# Patient Record
Sex: Female | Born: 1985 | Race: White | Hispanic: Yes | Marital: Single | Smoking: Never smoker
Health system: Southern US, Community
[De-identification: ages and names within clinical notes are randomized; demographics above are authoritative.]

## PROBLEM LIST (undated history)

## (undated) DIAGNOSIS — D649 Anemia, unspecified: Secondary | ICD-10-CM

## (undated) DIAGNOSIS — F32A Depression, unspecified: Secondary | ICD-10-CM

## (undated) DIAGNOSIS — N368 Other specified disorders of urethra: Secondary | ICD-10-CM

## (undated) DIAGNOSIS — R519 Headache, unspecified: Secondary | ICD-10-CM

## (undated) DIAGNOSIS — T7840XA Allergy, unspecified, initial encounter: Secondary | ICD-10-CM

## (undated) DIAGNOSIS — Z87442 Personal history of urinary calculi: Secondary | ICD-10-CM

## (undated) DIAGNOSIS — R350 Frequency of micturition: Secondary | ICD-10-CM

## (undated) DIAGNOSIS — F329 Major depressive disorder, single episode, unspecified: Secondary | ICD-10-CM

## (undated) DIAGNOSIS — R300 Dysuria: Secondary | ICD-10-CM

## (undated) DIAGNOSIS — E039 Hypothyroidism, unspecified: Secondary | ICD-10-CM

## (undated) DIAGNOSIS — R3915 Urgency of urination: Secondary | ICD-10-CM

## (undated) DIAGNOSIS — N3946 Mixed incontinence: Secondary | ICD-10-CM

## (undated) DIAGNOSIS — O3432 Maternal care for cervical incompetence, second trimester: Secondary | ICD-10-CM

## (undated) DIAGNOSIS — G8929 Other chronic pain: Secondary | ICD-10-CM

## (undated) DIAGNOSIS — N883 Incompetence of cervix uteri: Secondary | ICD-10-CM

## (undated) DIAGNOSIS — Z8639 Personal history of other endocrine, nutritional and metabolic disease: Secondary | ICD-10-CM

## (undated) DIAGNOSIS — Z8742 Personal history of other diseases of the female genital tract: Secondary | ICD-10-CM

## (undated) DIAGNOSIS — Z87410 Personal history of cervical dysplasia: Secondary | ICD-10-CM

## (undated) HISTORY — PX: NO PAST SURGERIES: SHX2092

## (undated) HISTORY — DX: Maternal care for cervical incompetence, second trimester: O34.32

## (undated) HISTORY — DX: Anemia, unspecified: D64.9

## (undated) HISTORY — DX: Allergy, unspecified, initial encounter: T78.40XA

---

## 2014-04-18 ENCOUNTER — Ambulatory Visit: Payer: Self-pay

## 2014-06-10 ENCOUNTER — Ambulatory Visit: Payer: Self-pay | Attending: Internal Medicine

## 2015-08-10 NOTE — L&D Delivery Note (Signed)
Patient called out to the RN in pain at about 11:18PM, and had delivered the baby in the bed before the nurse was in the room. Nurse clamped x2 and cut the cord, handed the mother her baby. Placenta was left in place until I came to the room. When I arrived, the patient was comfortable, holding the non-viable infant. It was noted about 300cc of clots/blood was seen outside of the vagina in the bed. Placental cord was laying out of the vagina. I had patient push three times, and the placenta delivered spontaneously intact. A large 50cc clot followed behind the placenta. Fundus was firm, about at the suprapubic bone. Good hemostasis was noted. Placenta was sent to pathology. Mom recovering in LDR, infant was pronounced dead at 23:18 on 13-Aug-2016 by the RN, being held by mom currently.   Zenda Alpers, DO  [facility] for [facility], [facility]

## 2016-03-15 DIAGNOSIS — R87610 Atypical squamous cells of undetermined significance on cytologic smear of cervix (ASC-US): Secondary | ICD-10-CM | POA: Diagnosis present

## 2016-03-29 DIAGNOSIS — E039 Hypothyroidism, unspecified: Secondary | ICD-10-CM | POA: Insufficient documentation

## 2016-06-03 LAB — CYTOLOGY - PAP: Pap: NEGATIVE

## 2016-06-03 LAB — OB RESULTS CONSOLE ABO/RH: RH Type: POSITIVE

## 2016-06-03 LAB — OB RESULTS CONSOLE GC/CHLAMYDIA
CHLAMYDIA, DNA PROBE: NEGATIVE
GC PROBE AMP, GENITAL: NEGATIVE

## 2016-06-03 LAB — OB RESULTS CONSOLE RUBELLA ANTIBODY, IGM: RUBELLA: IMMUNE

## 2016-06-03 LAB — OB RESULTS CONSOLE HIV ANTIBODY (ROUTINE TESTING): HIV: NONREACTIVE

## 2016-06-03 LAB — OB RESULTS CONSOLE RPR: RPR: NONREACTIVE

## 2016-06-03 LAB — OB RESULTS CONSOLE HEPATITIS B SURFACE ANTIGEN: Hepatitis B Surface Ag: NEGATIVE

## 2016-06-03 LAB — OB RESULTS CONSOLE ANTIBODY SCREEN: ANTIBODY SCREEN: NEGATIVE

## 2016-06-09 ENCOUNTER — Other Ambulatory Visit: Payer: Self-pay | Admitting: Obstetrics and Gynecology

## 2016-06-09 DIAGNOSIS — E049 Nontoxic goiter, unspecified: Secondary | ICD-10-CM

## 2016-06-15 ENCOUNTER — Ambulatory Visit (HOSPITAL_COMMUNITY)
Admission: RE | Admit: 2016-06-15 | Discharge: 2016-06-15 | Disposition: A | Discharge status: Home / Self Care | Payer: Self-pay | Source: Ambulatory Visit | Attending: Obstetrics and Gynecology | Admitting: Obstetrics and Gynecology

## 2016-06-15 DIAGNOSIS — E049 Nontoxic goiter, unspecified: Secondary | ICD-10-CM

## 2016-07-21 ENCOUNTER — Inpatient Hospital Stay (HOSPITAL_COMMUNITY)
Admission: AD | Admit: 2016-07-21 | Discharge: 2016-07-22 | DRG: 775 | Disposition: A | Discharge status: Home / Self Care | Payer: Medicaid Other | Source: Ambulatory Visit | Attending: Obstetrics & Gynecology | Admitting: Obstetrics & Gynecology

## 2016-07-21 ENCOUNTER — Inpatient Hospital Stay (HOSPITAL_COMMUNITY): Payer: Medicaid Other

## 2016-07-21 ENCOUNTER — Encounter (HOSPITAL_COMMUNITY): Payer: Self-pay

## 2016-07-21 DIAGNOSIS — Z3A21 21 weeks gestation of pregnancy: Secondary | ICD-10-CM | POA: Diagnosis not present

## 2016-07-21 DIAGNOSIS — O4700 False labor before 37 completed weeks of gestation, unspecified trimester: Secondary | ICD-10-CM

## 2016-07-21 DIAGNOSIS — O039 Complete or unspecified spontaneous abortion without complication: Secondary | ICD-10-CM

## 2016-07-21 DIAGNOSIS — O3432 Maternal care for cervical incompetence, second trimester: Secondary | ICD-10-CM

## 2016-07-21 DIAGNOSIS — O364XX0 Maternal care for intrauterine death, not applicable or unspecified: Secondary | ICD-10-CM

## 2016-07-21 DIAGNOSIS — O479 False labor, unspecified: Secondary | ICD-10-CM

## 2016-07-21 LAB — DIFFERENTIAL
BASOS ABS: 0 10*3/uL (ref 0.0–0.1)
Basophils Relative: 0 %
Eosinophils Absolute: 0.1 10*3/uL (ref 0.0–0.7)
Eosinophils Relative: 1 %
LYMPHS ABS: 3.4 10*3/uL (ref 0.7–4.0)
LYMPHS PCT: 30 %
MONO ABS: 0.5 10*3/uL (ref 0.1–1.0)
MONOS PCT: 4 %
NEUTROS ABS: 7.4 10*3/uL (ref 1.7–7.7)
Neutrophils Relative %: 65 %

## 2016-07-21 LAB — CBC
HEMATOCRIT: 35.5 % — AB (ref 36.0–46.0)
HEMOGLOBIN: 11.9 g/dL — AB (ref 12.0–15.0)
MCH: 27.8 pg (ref 26.0–34.0)
MCHC: 33.5 g/dL (ref 30.0–36.0)
MCV: 82.9 fL (ref 78.0–100.0)
Platelets: 245 10*3/uL (ref 150–400)
RBC: 4.28 MIL/uL (ref 3.87–5.11)
RDW: 14.2 % (ref 11.5–15.5)
WBC: 11.4 10*3/uL — AB (ref 4.0–10.5)

## 2016-07-21 LAB — TYPE AND SCREEN
ABO/RH(D): A POS
Antibody Screen: NEGATIVE

## 2016-07-21 MED ORDER — FENTANYL CITRATE (PF) 100 MCG/2ML IJ SOLN
100.0000 ug | Freq: Once | INTRAMUSCULAR | Status: AC
  Administered 2016-07-21: 100 ug via INTRAVENOUS
  Filled 2016-07-21: qty 2

## 2016-07-21 MED ORDER — ACETAMINOPHEN 325 MG PO TABS: 650.0000 mg | ORAL_TABLET | ORAL | Status: DC | PRN

## 2016-07-21 MED ORDER — LACTATED RINGERS IV SOLN
500.0000 mL | INTRAVENOUS | Status: DC | PRN
Start: 2016-07-21 — End: 2016-07-22

## 2016-07-21 MED ORDER — OXYCODONE-ACETAMINOPHEN 5-325 MG PO TABS: 1.0000 | ORAL_TABLET | ORAL | Status: DC | PRN

## 2016-07-21 MED ORDER — OXYTOCIN 40 UNITS IN LACTATED RINGERS INFUSION - SIMPLE MED
2.5000 [IU]/h | INTRAVENOUS | Status: DC
  Filled 2016-07-21: qty 1000

## 2016-07-21 MED ORDER — SOD CITRATE-CITRIC ACID 500-334 MG/5ML PO SOLN: 30.0000 mL | ORAL | Status: DC | PRN

## 2016-07-21 MED ORDER — LACTATED RINGERS IV SOLN
INTRAVENOUS | Status: DC
  Administered 2016-07-21: 125 mL/h via INTRAVENOUS

## 2016-07-21 MED ORDER — ONDANSETRON HCL 4 MG/2ML IJ SOLN: 4.0000 mg | Freq: Four times a day (QID) | INTRAMUSCULAR | Status: DC | PRN

## 2016-07-21 MED ORDER — FENTANYL CITRATE (PF) 100 MCG/2ML IJ SOLN: 100.0000 ug | Freq: Once | INTRAMUSCULAR | Status: DC

## 2016-07-21 MED ORDER — NALBUPHINE HCL 10 MG/ML IJ SOLN: 5.0000 mg | INTRAMUSCULAR | Status: DC | PRN

## 2016-07-21 MED ORDER — OXYCODONE-ACETAMINOPHEN 5-325 MG PO TABS: 2.0000 | ORAL_TABLET | ORAL | Status: DC | PRN

## 2016-07-21 MED ORDER — LACTATED RINGERS IV SOLN: INTRAVENOUS | Status: DC

## 2016-07-21 MED ORDER — FENTANYL CITRATE (PF) 100 MCG/2ML IJ SOLN
INTRAMUSCULAR | Status: AC
  Filled 2016-07-21: qty 2

## 2016-07-21 MED ORDER — OXYTOCIN BOLUS FROM INFUSION
500.0000 mL | Freq: Once | INTRAVENOUS | Status: AC
  Administered 2016-07-21: 500 mL via INTRAVENOUS

## 2016-07-21 MED ORDER — LIDOCAINE HCL (PF) 1 % IJ SOLN: 30.0000 mL | INTRAMUSCULAR | Status: DC | PRN

## 2016-07-21 NOTE — H&P (Signed)
LABOR AND DELIVERY ADMISSION HISTORY AND PHYSICAL NOTE  Andrea Barry is a 30 y.o. female G3P0020 with IUP at [redacted]w[redacted]d by definite LMP presenting for contractions.   Patient has been having contractions since yesterday, became strong today, so came to MAU. When she was seen in MAU, she was visually uncomfortable with contractions about every 2 min.   She was seen in the room immediately, a SSE was performed, unable to get speculum in the vagina due to membranes and fetal parts (looked like head/hair) were near introitus. SVE felt head/bulging bag at +3 station, with complete dilation of cervix. Imminent delivery was likely.   Patient has had two other SABs previously, states her first SAB in 2016 was at about 20weeks.   Past Medical History: History reviewed. No pertinent past medical history. ?Hyperthyroidism  Past Surgical History: History reviewed. No pertinent surgical history.   Obstetrical History: OB History    Gravida Para Term Preterm AB Living   3 0 0 0 2 0   SAB TAB Ectopic Multiple Live Births   2 0 0 0 0      Social History: Social History   Social History  . Marital status: Single    Spouse name: N/A  . Number of children: N/A  . Years of education: N/A   Social History Main Topics  . Smoking status: None  . Smokeless tobacco: None  . Alcohol use None  . Drug use: Unknown  . Sexual activity: Not Asked   Other Topics Concern  . None   Social History Narrative  . None    Family History: No family history on file.  Allergies: No Known Allergies  Prescriptions Prior to Admission  Medication Sig Dispense Refill Last Dose  . Prenatal Vit-Fe Fumarate-FA (PREPLUS) 27-1 MG TABS Take 1 tablet by mouth daily.        Review of Systems   All systems reviewed and negative except as stated in HPI  Blood pressure (!) 113/56, pulse 75, temperature 98.7 F (37.1 C), temperature source Oral, resp. rate 17, height 5\' 6"  (1.676 m), weight 180 lb (81.6 kg),  SpO2 100 %. General appearance: alert, cooperative, appears stated age and mild distress due to contractions Lungs: clear to auscultation bilaterally Heart: regular rate and rhythm Abdomen: soft, non-tender; bowel sounds normal Extremities: No calf swelling or tenderness Presentation: cephalic initially visually with speculum exam, after AROM (lightly yellow tinged fluid), a cord and foot was found in the vagina.   Prenatal labs: ABO, Rh: A/Positive/-- (10/26 0000) Antibody: Negative (10/26 0000) Rubella: !Error! IMMUNE RPR: Nonreactive (10/26 0000)  HBsAg: Negative (10/26 0000)  HIV: Non-reactive (10/26 0000)  GBS:   N/A 1 hr Glucola: (early) 92 Genetic screening: Incomplete labs, but CF neg, and may have had done at last visit but do not have results, may be at [facility]. Anatomy US: Do not have, bedside US today for dating shows 21 1/7 weeks.  Prenatal Transfer Tool  Maternal Diabetes: No Genetic Screening: Do not have records Maternal Ultrasounds/Referrals: Do not have records Fetal Ultrasounds or other Referrals:  None Maternal Substance Abuse:  No Significant Maternal Medications:  None Significant Maternal Lab Results: Lab values include: Other:   Results for orders placed or performed during the hospital encounter of 07/21/16 (from the past 24 hour(s))  CBC   Collection Time: 07/21/16  9:43 PM  Result Value Ref Range   WBC 11.4 (H) 4.0 - 10.5 K/uL   RBC 4.28 3.87 - 5.11 MIL/uL  Hemoglobin 11.9 (L) 12.0 - 15.0 g/dL   HCT 35.5 (L) 36.0 - 46.0 %   MCV 82.9 78.0 - 100.0 fL   MCH 27.8 26.0 - 34.0 pg   MCHC 33.5 30.0 - 36.0 g/dL   RDW 14.2 11.5 - 15.5 %   Platelets 245 150 - 400 K/uL  Differential   Collection Time: 07/21/16  9:43 PM  Result Value Ref Range   Neutrophils Relative % 65 %   Neutro Abs 7.4 1.7 - 7.7 K/uL   Lymphocytes Relative 30 %   Lymphs Abs 3.4 0.7 - 4.0 K/uL   Monocytes Relative 4 %   Monocytes Absolute 0.5 0.1 - 1.0 K/uL   Eosinophils Relative 1 %    Eosinophils Absolute 0.1 0.0 - 0.7 K/uL   Basophils Relative 0 %   Basophils Absolute 0.0 0.0 - 0.1 K/uL    There are no active problems to display for this patient.   Assessment: Andrea Barry is a 30 y.o. G3P0020 at [redacted]w[redacted]d here for SAB at [redacted] weeks EGA.  Anticipate delivery, AROM performed in MAU to augment inevitable delivery Fentanyl as needed Bedside US to be performed to confirm dating (Definite LMP) TORCH labs, prenatal labs, TSH/T4/T3 drawn, UDS collected   [name], [place] for [facility], [facility] 07/21/2016, 10:41 PM

## 2016-07-21 NOTE — MAU Note (Signed)
Pt reports contraction like pain that started yesterday. Denies LOF or vag bleeding. +FM. Denies urinary s/s, n/v/d.

## 2016-07-22 ENCOUNTER — Encounter (HOSPITAL_COMMUNITY): Payer: Self-pay | Admitting: *Deleted

## 2016-07-22 DIAGNOSIS — Z3A21 21 weeks gestation of pregnancy: Secondary | ICD-10-CM

## 2016-07-22 LAB — RAPID URINE DRUG SCREEN, HOSP PERFORMED
Amphetamines: NOT DETECTED
BARBITURATES: NOT DETECTED
BENZODIAZEPINES: NOT DETECTED
Cocaine: NOT DETECTED
Opiates: NOT DETECTED
TETRAHYDROCANNABINOL: NOT DETECTED

## 2016-07-22 LAB — URINALYSIS, ROUTINE W REFLEX MICROSCOPIC
BILIRUBIN URINE: NEGATIVE
GLUCOSE, UA: NEGATIVE mg/dL
KETONES UR: NEGATIVE mg/dL
LEUKOCYTES UA: NEGATIVE
Nitrite: NEGATIVE
PH: 6 (ref 5.0–8.0)
Protein, ur: NEGATIVE mg/dL
Specific Gravity, Urine: 1.017 (ref 1.005–1.030)
Squamous Epithelial / LPF: NONE SEEN

## 2016-07-22 LAB — TSH: TSH: 2.186 u[IU]/mL (ref 0.350–4.500)

## 2016-07-22 LAB — RPR: RPR: NONREACTIVE

## 2016-07-22 LAB — HIV ANTIBODY (ROUTINE TESTING W REFLEX): HIV SCREEN 4TH GENERATION: NONREACTIVE

## 2016-07-22 LAB — HEPATITIS B SURFACE ANTIGEN: Hepatitis B Surface Ag: NEGATIVE

## 2016-07-22 LAB — ABO/RH: ABO/RH(D): A POS

## 2016-07-22 LAB — T4, FREE: FREE T4: 0.71 ng/dL (ref 0.61–1.12)

## 2016-07-22 MED ORDER — IBUPROFEN 600 MG PO TABS: 600.0000 mg | ORAL_TABLET | Freq: Four times a day (QID) | ORAL | 0 refills | Status: DC | PRN

## 2016-07-22 MED ORDER — IBUPROFEN 600 MG PO TABS
600.0000 mg | ORAL_TABLET | Freq: Once | ORAL | Status: AC
  Administered 2016-07-22: 600 mg via ORAL
  Filled 2016-07-22: qty 1

## 2016-07-22 NOTE — Discharge Instructions (Signed)
Vaginal Delivery, Care After Refer to this sheet in the next few weeks. These instructions provide you with information on caring for yourself after your delivery. Your health care provider may also give you more specific instructions. Your treatment has been planned according to current medical practices, but problems sometimes occur. Call your health care provider if you have any problems or questions after your delivery. What to expect after your delivery After your delivery, it is typical to have the following:  You may feel pain in the vaginal area for several days after delivery. If you had an incision or a vaginal tear, the area will probably continue to be tender to the touch for several weeks.  You may feel very fatigued after a vaginal delivery.  You may have vaginal bleeding and discharge that will start out red, then become pink, then yellow, then white. Altogether, this usually lasts for about 6 weeks.  The combination of having lost your baby and changing hormones from the delivery can make you feel very sad. You may also experience emotions that change very quickly. Some of the emotions people often notice after loss include:  Anger.  Denial.  Guilt.  Sorrow.  Depression.  Grief.  Relationship problems. Follow these instructions at home:  Consider seeking support for your loss. Some forms of support that you might consider include your religious leader, friends, family, a Medical illustrator, or a bereavement support group.  Take medicines only as directed by your health care provider.  Continue to use good perineal care. Good perineal care includes:  Wiping your perineum from front to back.  Keeping your perineum clean.  Do not use tampons or douche until your health care provider says it is okay.  Shower, wash your hair, and take tub baths as directed by your health care provider.  Wear a well-fitting bra that provides breast support.  Drink enough  fluids to keep your urine clear or pale yellow.  Eat healthy foods.  Eat high-fiber foods every day, such as whole grain cereals and breads, brown rice, beans, and fresh fruits and vegetables. These foods may help prevent or relieve constipation.  Follow your health care provider's directions about resuming activities such as climbing stairs, driving, lifting, exercising, or traveling.  Increase your activities gradually.  Talk to your health care provider about resuming sexual activities. This depends on your risk of infection, your rate of healing, and your comfort and desire to resume sexual activity.  Try to have someone help you with your household activities for at least a few days after you leave the hospital.  Rest as much as possible.  Keep all of your scheduled postpartum appointments. It is very important to keep your scheduled follow-up appointments. At these appointments, your health care provider will be checking to make sure that you are healing physically and emotionally.  Do not drink alcohol, especially if you are taking medicine to relieve pain.  Do not use any tobacco products including cigarettes, chewing tobacco, or electronic cigarettes. If you need help quitting, ask your health care provider.  Do not use illegal drugs. Contact a health care provider if:  You feel sad or depressed.  You have thoughts of hurting yourself.  You are having trouble eating or sleeping.  You cannot enjoy the things in life you have previously enjoyed.  You are passing large clots from your vagina. Save any clots to show your health care provider.  You have a bad smelling discharge from your vagina.  You have trouble urinating.  You are urinating frequently.  You have pain when you urinate.  You have a change in your bowel movements.  You have increasing redness, pain, or swelling near your incision or vaginal tear.  You have pus draining from your incision or vaginal  tear.  Your incision or vaginal tear is separating.  You have painful, hard, or reddened breasts.  You have a severe headache.  You have blurred vision or see spots.  You are dizzy or light-headed.  You have a rash.  You have nausea or vomiting.  You have not had a menstrual period by the 12th week after delivery.  You have a fever. Get help right away if:  You are concerned that you may hurt yourself or you are considering suicide.  You have persistent pain.  You have chest pain.  You have shortness of breath.  You faint.  You have leg pain.  You have stomach pain.  Your vaginal bleeding saturates two or more sanitary pads in 1 hour. This information is not intended to replace advice given to you by your health care provider. Make sure you discuss any questions you have with your health care provider. Document Released: 12/10/2013 Document Revised: 01/01/2016 Document Reviewed: 09/13/2013 Elsevier Interactive Patient Education  2017 [employer].

## 2016-07-22 NOTE — Discharge Summary (Signed)
OB Discharge Summary     Patient Name: Andrea Barry DOB: 06/05/1986 MRN: [id number]  Date of admission: 07/21/2016 Delivering MD: Isaias Sakai   Date of discharge: 07/22/2016  Admitting diagnosis: 20w problems Intrauterine pregnancy: [redacted]w[redacted]d     Secondary diagnosis:  Principal Problem:   Premature labor before [redacted] weeks gestation Active Problems:   Fetal demise before 22 weeks with retention of dead fetus  Additional problems: None     Discharge diagnosis: Pre-viable delivery                                                                                                Post partum procedures:None  Augmentation: AROM  Complications: None  Hospital course:  Onset of Labor With Vaginal Delivery     30 y.o. yo [id number] at [redacted]w[redacted]d was admitted in Active Labor on 07/21/2016. Patient had an uncomplicated pre-viable preterm labor course as follows:  Patient came in contracting every 2-3 minutes, found to have membrane and fetal parts in vagina near introitus.  Membrane Rupture Time/Date: 9:59 PM ,07/21/2016   Intrapartum Procedures: Episiotomy: None [1]                                         Lacerations:  None [1]  Patient had a precipitous delivery of a Non Viable 21 week infant (LMP confirmed with femur length on [place] for dating before delivery). 07/21/2016   Information for the patient's newborn:  Andrea, Barry [id number]  Delivery Method: Vaginal, Spontaneous Delivery (Filed from Delivery Summary)    Pateint had an uncomplicated postpartum course. She requested to go home after the delivery and appropriate grieving time with the fetus.  She is ambulating, tolerating a regular diet, passing flatus, and urinating well. Her lochia is appropriate. Patient is discharged home in stable condition on 07/25/16.   Discussed with patient to follow up with OB/GYN (information for [place] office given). Recommended pelvic rest for at least 6 weeks.   Recommend that  patient should be considered high risk for any future pregnancies (due to TWO 20+ week pregnancy losses. Encouraged follow up with OB/GYN for pre-conception counseling. May need cervical length checks, possible prophylaxis cerclage and/or progesterone.     Physical exam Vitals:   07/22/16 0001 07/22/16 0016 07/22/16 0031 07/22/16 0046  BP: 104/60 (!) 100/59 (!) 97/57 93/62  Pulse: 73 72 86 87  Resp: 16   18  Temp: 98.8 F (37.1 C)   98.4 F (36.9 C)  TempSrc: Oral   Oral  SpO2:      Weight:      Height:       General: alert, cooperative and no distress Lochia: appropriate Uterine Fundus: firm Incision: N/A DVT Evaluation: No evidence of DVT seen on physical exam. Negative Homan's sign. No cords or calf tenderness. Labs: Lab Results  Component Value Date   WBC 11.4 (H) 07/21/2016   HGB 11.9 (L) 07/21/2016   HCT 35.5 (L) 07/21/2016   MCV 82.9 07/21/2016  PLT 245 07/21/2016   No flowsheet data found.  Discharge instruction: per After Visit Summary and "Baby and Me Booklet".  After visit meds:  Allergies as of 07/22/2016   No Known Allergies     Medication List    TAKE these medications   ibuprofen 600 MG tablet Commonly known as:  ADVIL,MOTRIN Take 1 tablet (600 mg total) by mouth every 6 (six) hours as needed for mild pain, moderate pain or cramping.   PREPLUS 27-1 MG Tabs Take 1 tablet by mouth daily.       Diet: routine diet  Activity: Advance as tolerated. Pelvic rest for 6 weeks.   Outpatient follow up:1-2 weeks for follow up after 21 week SAB and anticipatory guidance for future pregnancies.  Postpartum contraception: None  Newborn Data: Female  Birth Weight: 15.9 oz (451 g) APGAR: 0, 0 Pre-viable at [redacted] weeks EGA  Disposition:morgue   07/22/2016 Andrea Basset, DO

## 2016-07-23 LAB — RUBELLA SCREEN: Rubella: 2.18 index (ref >0.99)

## 2016-07-23 LAB — SICKLE CELL SCREEN: SICKLE CELL SCREEN: NEGATIVE

## 2016-07-23 NOTE — Progress Notes (Signed)
I was paged by [employer] to offer support to Andrea Barry and her husband who were here to fill out Report of Fetal Death.  I offered emotional and spiritual support as well as help with questions about cremation.  The family is still making a decision about a funeral home, but will let us know.  They viewed their son one more time and were very grateful to have the opportunity to hold him again.  [place], [place] Pager, [phone] 2:43 PM

## 2016-07-24 LAB — TORCH-IGM(TOXO/ RUB/ CMV/ HSV) W TITER
CMV IgM: <30 AU/mL (ref 0.0–29.9)
HSVI/II Comb IgM: 1.2 Ratio — ABNORMAL HIGH (ref 0.00–0.90)

## 2016-07-24 LAB — INFECT DISEASE AB IGM REFLEX 1

## 2016-07-28 ENCOUNTER — Encounter (HOSPITAL_COMMUNITY): Payer: Self-pay | Admitting: *Deleted

## 2016-07-28 ENCOUNTER — Inpatient Hospital Stay (HOSPITAL_COMMUNITY)
Admission: AD | Admit: 2016-07-28 | Discharge: 2016-07-28 | Disposition: A | Discharge status: Home / Self Care | Payer: Medicaid Other | Source: Ambulatory Visit | Attending: Family Medicine | Admitting: Family Medicine

## 2016-07-28 DIAGNOSIS — R52 Pain, unspecified: Secondary | ICD-10-CM | POA: Diagnosis not present

## 2016-07-28 DIAGNOSIS — O9089 Other complications of the puerperium, not elsewhere classified: Secondary | ICD-10-CM

## 2016-07-28 DIAGNOSIS — R1030 Lower abdominal pain, unspecified: Secondary | ICD-10-CM | POA: Insufficient documentation

## 2016-07-28 HISTORY — DX: Hypothyroidism, unspecified: E03.9

## 2016-07-28 LAB — URINALYSIS, ROUTINE W REFLEX MICROSCOPIC
Bilirubin Urine: NEGATIVE
GLUCOSE, UA: NEGATIVE mg/dL
Hgb urine dipstick: NEGATIVE
KETONES UR: NEGATIVE mg/dL
LEUKOCYTES UA: NEGATIVE
Nitrite: NEGATIVE
PH: 6 (ref 5.0–8.0)
Protein, ur: NEGATIVE mg/dL
SPECIFIC GRAVITY, URINE: 1.018 (ref 1.005–1.030)

## 2016-07-28 LAB — CBC WITH DIFFERENTIAL/PLATELET
Basophils Absolute: 0 10*3/uL (ref 0.0–0.1)
Basophils Relative: 0 %
Eosinophils Absolute: 0.1 10*3/uL (ref 0.0–0.7)
Eosinophils Relative: 1 %
HEMATOCRIT: 26.1 % — AB (ref 36.0–46.0)
HEMOGLOBIN: 8.4 g/dL — AB (ref 12.0–15.0)
LYMPHS ABS: 2.5 10*3/uL (ref 0.7–4.0)
LYMPHS PCT: 35 %
MCH: 27 pg (ref 26.0–34.0)
MCHC: 32.2 g/dL (ref 30.0–36.0)
MCV: 83.9 fL (ref 78.0–100.0)
MONO ABS: 0.3 10*3/uL (ref 0.1–1.0)
MONOS PCT: 4 %
NEUTROS ABS: 4.1 10*3/uL (ref 1.7–7.7)
NEUTROS PCT: 60 %
Platelets: 333 10*3/uL (ref 150–400)
RBC: 3.11 MIL/uL — ABNORMAL LOW (ref 3.87–5.11)
RDW: 14.2 % (ref 11.5–15.5)
WBC: 7 10*3/uL (ref 4.0–10.5)

## 2016-07-28 LAB — WET PREP, GENITAL
Clue Cells Wet Prep HPF POC: NONE SEEN
SPERM: NONE SEEN
Trich, Wet Prep: NONE SEEN
Yeast Wet Prep HPF POC: NONE SEEN

## 2016-07-28 LAB — POCT PREGNANCY, URINE: Preg Test, Ur: POSITIVE — AB

## 2016-07-28 MED ORDER — OXYCODONE-ACETAMINOPHEN 5-325 MG PO TABS: 1.0000 | ORAL_TABLET | ORAL | 0 refills | Status: DC | PRN

## 2016-07-28 MED ORDER — KETOROLAC TROMETHAMINE 60 MG/2ML IM SOLN: 60.0000 mg | Freq: Once | INTRAMUSCULAR | Status: DC

## 2016-07-28 NOTE — MAU Provider Note (Signed)
Chief Complaint: Headache and Abdominal Cramping   None     SUBJECTIVE HPI: Andrea Barry is a 30 y.o. [phone] who is 1 week s/p previable delivery at 29 weeks who presents to maternity admissions reporting lower abdominal and vaginal pain x 2-3 days.  She reports the pain is only when she lies down.  It is intermittent cramping pain that is not resolved with ibuprofen.  She has not tried any other treatments.  The pain is intermittent and unchanged since onset.  She reports scant pink lochia only.  She denies vaginal itching/burning, urinary symptoms, h/a, dizziness, n/v, or fever/chills.     HPI  Past Medical History:  Diagnosis Date  . Hypothyroidism   . Preterm delivery    2nd trimester fetal loss   History reviewed. No pertinent surgical history. Social History   Social History  . Marital status: Single    Spouse name: N/A  . Number of children: N/A  . Years of education: N/A   Occupational History  . Not on file.   Social History Main Topics  . Smoking status: Never Smoker  . Smokeless tobacco: Never Used  . Alcohol use No  . Drug use: No  . Sexual activity: Not on file   Other Topics Concern  . Not on file   Social History Narrative  . No narrative on file   No current facility-administered medications on file prior to encounter.    Current Outpatient Prescriptions on File Prior to Encounter  Medication Sig Dispense Refill  . ibuprofen (ADVIL,MOTRIN) 600 MG tablet Take 1 tablet (600 mg total) by mouth every 6 (six) hours as needed for mild pain, moderate pain or cramping. (Patient not taking: Reported on 07/28/2016) 30 tablet 0   Allergies  Allergen Reactions  . Metronidazole Other (See Comments)    Dizziness, taste of iron in mouth, and darkened urine    ROS:  Review of Systems  Constitutional: Negative for chills, fatigue and fever.  Respiratory: Negative for shortness of breath.   Cardiovascular: Negative for chest pain.  Gastrointestinal:  Positive for abdominal pain.  Genitourinary: Positive for pelvic pain and vaginal pain. Negative for difficulty urinating, dysuria, flank pain, vaginal bleeding and vaginal discharge.  Neurological: Negative for dizziness and headaches.  Psychiatric/Behavioral: Negative.      I have reviewed patient's Past Medical Hx, Surgical Hx, Family Hx, Social Hx, medications and allergies.   Physical Exam   Patient Vitals for the past 24 hrs:  BP Temp Temp src Pulse Resp  07/28/16 1431 112/64 99 F (37.2 C) Oral 86 16   Constitutional: Well-developed, well-nourished female in no acute distress.  Cardiovascular: normal rate Respiratory: normal effort GI: Abd soft, non-tender. Pos BS x 4 MS: Extremities nontender, no edema, normal ROM Neurologic: Alert and oriented x 4.  GU: Neg CVAT.  PELVIC EXAM: Cervix pink, visually closed, without lesion, scant white creamy discharge, vaginal walls and external genitalia normal Bimanual exam: Cervix 0/long/high, firm, anterior, neg CMT, uterus nontender, nonenlarged, adnexa without tenderness, enlargement, or mass   LAB RESULTS Results for orders placed or performed during the hospital encounter of 07/28/16 (from the past 24 hour(s))  Urinalysis, Routine w reflex microscopic     Status: None   Collection Time: 07/28/16  3:22 PM  Result Value Ref Range   Color, Urine YELLOW YELLOW   APPearance CLEAR CLEAR   Specific Gravity, Urine 1.018 1.005 - 1.030   pH 6.0 5.0 - 8.0   Glucose, UA NEGATIVE NEGATIVE mg/dL  Hgb urine dipstick NEGATIVE NEGATIVE   Bilirubin Urine NEGATIVE NEGATIVE   Ketones, ur NEGATIVE NEGATIVE mg/dL   Protein, ur NEGATIVE NEGATIVE mg/dL   Nitrite NEGATIVE NEGATIVE   Leukocytes, UA NEGATIVE NEGATIVE  Pregnancy, urine POC     Status: Abnormal   Collection Time: 07/28/16  3:23 PM  Result Value Ref Range   Preg Test, Ur POSITIVE (A) NEGATIVE  CBC with Differential     Status: Abnormal   Collection Time: 07/28/16  3:49 PM  Result  Value Ref Range   WBC 7.0 4.0 - 10.5 K/uL   RBC 3.11 (L) 3.87 - 5.11 MIL/uL   Hemoglobin 8.4 (L) 12.0 - 15.0 g/dL   HCT 26.1 (L) 36.0 - 46.0 %   MCV 83.9 78.0 - 100.0 fL   MCH 27.0 26.0 - 34.0 pg   MCHC 32.2 30.0 - 36.0 g/dL   RDW 14.2 11.5 - 15.5 %   Platelets 333 150 - 400 K/uL   Neutrophils Relative % 60 %   Neutro Abs 4.1 1.7 - 7.7 K/uL   Lymphocytes Relative 35 %   Lymphs Abs 2.5 0.7 - 4.0 K/uL   Monocytes Relative 4 %   Monocytes Absolute 0.3 0.1 - 1.0 K/uL   Eosinophils Relative 1 %   Eosinophils Absolute 0.1 0.0 - 0.7 K/uL   Basophils Relative 0 %   Basophils Absolute 0.0 0.0 - 0.1 K/uL  Wet prep, genital     Status: Abnormal   Collection Time: 07/28/16  4:31 PM  Result Value Ref Range   Yeast Wet Prep HPF POC NONE SEEN NONE SEEN   Trich, Wet Prep NONE SEEN NONE SEEN   Clue Cells Wet Prep HPF POC NONE SEEN NONE SEEN   WBC, Wet Prep HPF POC MODERATE (A) NONE SEEN   Sperm NONE SEEN     --/--/A POS, A POS (12/13 2143)  IMAGING [place] Mfm Ob Limited  Result Date: 07/22/2016 OBSTETRICAL ULTRASOUND: This exam was performed within a [facility]. The OB US report was generated in the AS system, and faxed to the ordering physician.  This report is available in the [employer]. See the AS Obstetric US report via the Image Link.   MAU Management/MDM: Ordered labs and reviewed results.  No evidence of infection or acute abdomen on exam.  Will treat for musculoskeletal pain with rest/ice/heat.  Rx for Percocet 5/325 x 6 tabs. Appointment made for pt to be seen in office tomorrow to see social work, get set up for counseling, and discuss plans for future pregnancies, cerclage etc.  Treatments in MAU included Toradol 60 mg IM with improvement in pt pain. Pt stable at time of discharge.  ASSESSMENT 1. Postpartum pain     PLAN Discharge home F/U in office tomorrow  Allergies as of 07/28/2016      Reactions   Metronidazole Other (See Comments)   Dizziness,  taste of iron in mouth, and darkened urine      Medication List    TAKE these medications   ibuprofen 600 MG tablet Commonly known as:  ADVIL,MOTRIN Take 1 tablet (600 mg total) by mouth every 6 (six) hours as needed for mild pain, moderate pain or cramping.   oxyCODONE-acetaminophen 5-325 MG tablet Commonly known as:  PERCOCET/ROXICET Take 1-2 tablets by mouth every 4 (four) hours as needed for severe pain.   SYNTHROID 100 MCG tablet Generic drug:  levothyroxine Take 200 mcg by mouth daily before breakfast.  Follow-up [place] for [place] Follow up.   Specialty:  Obstetrics and Gynecology Why:  As scheduled tomorrow at 10:20 am.  Return to MAU as needed for emergencies. Contact information: [place] Certified Nurse-Midwife 07/28/2016  5:29 PM

## 2016-07-28 NOTE — Discharge Instructions (Signed)
Musculoskeletal Pain  Musculoskeletal pain is muscle and bone aches and pains. This pain can occur in any part of the body.  Follow these instructions at home:  · Only take medicines for pain, discomfort, or fever as told by your health care provider.  · You may continue all activities unless the activities cause more pain. When the pain lessens, slowly resume normal activities. Gradually increase the intensity and duration of the activities or exercise.  · During periods of severe pain, bed rest may be helpful. Lie or sit in any position that is comfortable, but get out of bed and walk around at least every several hours.  · If directed, put ice on the injured area.  ? Put ice in a plastic bag.  ? Place a towel between your skin and the bag.  ? Leave the ice on for 20 minutes, 2-3 times a day.  Contact a health care provider if:  · Your pain is getting worse.  · Your pain is not relieved with medicines.  · You lose function in the area of the pain if the pain is in your arms, legs, or neck.  This information is not intended to replace advice given to you by your health care provider. Make sure you discuss any questions you have with your health care provider.  Document Released: 07/26/2005 Document Revised: 01/06/2016 Document Reviewed: 03/30/2013  Elsevier Interactive Patient Education © 2017 [employer] Inc[employer]

## 2016-07-28 NOTE — MAU Note (Signed)
Had ~21wk loss last Wed.  Barely bleeding.  Still having pain and a horrible headache.  Resting during the day, both worse at night.

## 2016-07-29 ENCOUNTER — Ambulatory Visit (INDEPENDENT_AMBULATORY_CARE_PROVIDER_SITE_OTHER): Payer: Medicaid Other | Admitting: Certified Nurse Midwife

## 2016-07-29 ENCOUNTER — Encounter: Payer: Self-pay | Admitting: Certified Nurse Midwife

## 2016-07-29 VITALS — BP 101/65 | HR 81

## 2016-07-29 DIAGNOSIS — O364XX0 Maternal care for intrauterine death, not applicable or unspecified: Secondary | ICD-10-CM

## 2016-07-29 LAB — GC/CHLAMYDIA PROBE AMP (~~LOC~~) NOT AT ARMC
CHLAMYDIA, DNA PROBE: NEGATIVE
NEISSERIA GONORRHEA: NEGATIVE

## 2016-07-29 NOTE — Progress Notes (Signed)
GYN Note  Name: Andrea Barry Date: 07/29/2016 MRN: [id number]  DOB: 1985-08-20  HISTORY OF PRESENT ILLNESS: S/p SVD at 21.[redacted] weeks gestation 1 week ago. Pt reports insomnia and poor appetite since discharge. She is having a small amount of spotting and low back pain. She was seen in [facility] yesterday for cramping and HA and evaluation was negative. She endorses waxing and waning feelings of sadness and anger. She has been supported by her boyfriend, brother, and father. She is asking about resuming work Architectural technologist. Review of her chart reveals previous delivery at 20 weeks.  PAST MEDICAL HISTORY: Past Medical History:  Diagnosis Date  . Hypothyroidism   . Preterm delivery    2nd trimester fetal loss    PAST SURGICAL HISTORY: No past surgical history on file.  FAMILY HISTORY: No family history on file.  SOCIAL HISTORY:  reports that she has never smoked. She has never used smokeless tobacco. She reports that she does not drink alcohol or use drugs.  ALLERGIES:  is allergic to metronidazole.  MEDICATIONS:  Current Outpatient Prescriptions  Medication Sig Dispense Refill  . levothyroxine (SYNTHROID) 100 MCG tablet Take 200 mcg by mouth daily before breakfast.     No current facility-administered medications for this visit.     REVIEW OF SYSTEMS:  Pertinent items are noted in HPI.    BP 101/65   Pulse 81   Physical Exam: General appearance: alert, cooperative, no distress and teary at times Resp: normal effort and rate Cardio: nml rate Neurologic: Grossly normal  LABORATORY DATA: Results for orders placed or performed during the hospital encounter of 07/28/16 (from the past 72 hour(s))  Urinalysis, Routine w reflex microscopic     Status: None   Collection Time: 07/28/16  3:22 PM  Result Value Ref Range   Color, Urine YELLOW YELLOW   APPearance CLEAR CLEAR   Specific Gravity, Urine 1.018 1.005 - 1.030   pH 6.0 5.0 - 8.0   Glucose, UA NEGATIVE NEGATIVE mg/dL   Hgb urine  dipstick NEGATIVE NEGATIVE   Bilirubin Urine NEGATIVE NEGATIVE   Ketones, ur NEGATIVE NEGATIVE mg/dL   Protein, ur NEGATIVE NEGATIVE mg/dL   Nitrite NEGATIVE NEGATIVE   Leukocytes, UA NEGATIVE NEGATIVE  Pregnancy, urine POC     Status: Abnormal   Collection Time: 07/28/16  3:23 PM  Result Value Ref Range   Preg Test, Ur POSITIVE (A) NEGATIVE    Comment:        THE SENSITIVITY OF THIS METHODOLOGY IS >24 mIU/mL   CBC with Differential     Status: Abnormal   Collection Time: 07/28/16  3:49 PM  Result Value Ref Range   WBC 7.0 4.0 - 10.5 K/uL   RBC 3.11 (L) 3.87 - 5.11 MIL/uL   Hemoglobin 8.4 (L) 12.0 - 15.0 g/dL   HCT 26.1 (L) 36.0 - 46.0 %   MCV 83.9 78.0 - 100.0 fL   MCH 27.0 26.0 - 34.0 pg   MCHC 32.2 30.0 - 36.0 g/dL   RDW 14.2 11.5 - 15.5 %   Platelets 333 150 - 400 K/uL   Neutrophils Relative % 60 %   Neutro Abs 4.1 1.7 - 7.7 K/uL   Lymphocytes Relative 35 %   Lymphs Abs 2.5 0.7 - 4.0 K/uL   Monocytes Relative 4 %   Monocytes Absolute 0.3 0.1 - 1.0 K/uL   Eosinophils Relative 1 %   Eosinophils Absolute 0.1 0.0 - 0.7 K/uL   Basophils Relative 0 %   Basophils Absolute  0.0 0.0 - 0.1 K/uL  Wet prep, genital     Status: Abnormal   Collection Time: 07/28/16  4:31 PM  Result Value Ref Range   Yeast Wet Prep HPF POC NONE SEEN NONE SEEN   Trich, Wet Prep NONE SEEN NONE SEEN   Clue Cells Wet Prep HPF POC NONE SEEN NONE SEEN   WBC, Wet Prep HPF POC MODERATE (A) NONE SEEN    Comment: MANY BACTERIA SEEN   Sperm NONE SEEN    ASSESSMENT:  S/p SVD @21  weeks  PLAN:  Continue PNV daily OOW x6 weeks Behavioral health assessment if desires (declines today)- may call to schedule Heartstrings info Warm shower/heating pad prn back pain Tylenol or Ibuprofen prn HA Abstain from IC x6 weeks Warning sx discussed: fever, chills, bleeding Return for preconception counseling visit if desires Recommend early start of care with next pregnancy- consider cerclage  Total encounter 15  min, 100% of visit spent counseling

## 2016-07-29 NOTE — Progress Notes (Signed)
Patient reports difficulty with migraines recently

## 2016-09-28 ENCOUNTER — Encounter (HOSPITAL_COMMUNITY): Payer: Self-pay | Admitting: Obstetrics & Gynecology

## 2017-04-13 ENCOUNTER — Encounter: Payer: Self-pay | Admitting: Family Medicine

## 2017-04-13 ENCOUNTER — Ambulatory Visit (INDEPENDENT_AMBULATORY_CARE_PROVIDER_SITE_OTHER): Payer: Managed Care, Other (non HMO) | Admitting: Family Medicine

## 2017-04-13 VITALS — BP 103/63 | HR 69 | Temp 98.4°F | Resp 16 | Ht 63.78 in | Wt 135.0 lb

## 2017-04-13 DIAGNOSIS — E063 Autoimmune thyroiditis: Secondary | ICD-10-CM

## 2017-04-13 DIAGNOSIS — E038 Other specified hypothyroidism: Secondary | ICD-10-CM

## 2017-04-13 DIAGNOSIS — Z23 Encounter for immunization: Secondary | ICD-10-CM

## 2017-04-13 DIAGNOSIS — Z01419 Encounter for gynecological examination (general) (routine) without abnormal findings: Secondary | ICD-10-CM

## 2017-04-13 LAB — POCT WET + KOH PREP
Trich by wet prep: ABSENT
Yeast by KOH: ABSENT
Yeast by wet prep: ABSENT

## 2017-04-13 NOTE — Patient Instructions (Signed)
     IF you received an x-ray today, you will receive an invoice from [facility]. Please contact [facility] at [phone] with questions or concerns regarding your invoice.   IF you received labwork today, you will receive an invoice from [facility]. Please contact [facility] at [phone] with questions or concerns regarding your invoice.   Our billing staff will not be able to assist you with questions regarding bills from these companies.  You will be contacted with the lab results as soon as they are available. The fastest way to get your results is to activate your My Chart account. Instructions are located on the last page of this paperwork. If you have not heard from us regarding the results in 2 weeks, please contact this office.     

## 2017-04-13 NOTE — Progress Notes (Signed)
9/5/20183:54 PM  Zyah Gomm 12/16/1985, 31 y.o. female [id number]  Chief Complaint  Patient presents with  . Annual Exam    with pap     HPI:   Patient is a 31 y.o. female with past medical history significant for chronic thyroiditis who presents today for annual exam.  Menses regular, short cycles, heavy first day but then light, 3-5 days, no clots but painful. LMP last week.   [id number], patient has had 3 2T nonviable preterm deliveries, last one 07/2016. Not on birth control. Desires pregnancy, aware of recommendation for pre-conception counseling, planning with MFM. Coping ok, has good family support, has not done counseling, focuses on her work, denies any depression, does not want to talk about it.  Reports that during that pregnancy she had an abnormal pap, colpo not done  Reports has been stable on levothyroxine 200 mcg for years, gets medication mailed from [place]. reports biopsy done in [place] + chronic thyroiditis. US done 06/2016 - no nodules/ She not taken medication for past 2-3 months. New job that requires her to wake up earlier, having difficulty with new routine. Taking at night causes insomnia. Reports thinning hair, alternating cold/heat intolerance, mood changes, decreased libido.  Depression screen PHQ 2/9 04/13/2017  Decreased Interest 0  Down, Depressed, Hopeless 0  PHQ - 2 Score 0    Allergies  Allergen Reactions  . Metronidazole Other (See Comments)    Dizziness, taste of iron in mouth, and darkened urine    Current Outpatient Prescriptions on File Prior to Visit  Medication Sig Dispense Refill  . levothyroxine (SYNTHROID) 100 MCG tablet Take 200 mcg by mouth daily before breakfast.     No current facility-administered medications on file prior to visit.     Past Medical History:  Diagnosis Date  . Allergy   . Anemia   . Hypothyroidism   . Preterm delivery    2nd trimester fetal loss    History reviewed. No pertinent surgical  history.  Social History  Substance Use Topics  . Smoking status: Never Smoker  . Smokeless tobacco: Never Used  . Alcohol use No    Family History  Problem Relation Age of Onset  . Diabetes Mother   . Hypertension Mother   . Mental illness Maternal Grandmother   . Hypertension Paternal Grandmother     Review of Systems  Constitutional: Negative for chills and fever.  HENT: Negative for congestion, ear pain and sore throat.   Respiratory: Negative for cough and shortness of breath.   Cardiovascular: Negative for chest pain, palpitations and leg swelling.  Gastrointestinal: Negative for abdominal pain, nausea and vomiting.  Genitourinary: Negative for dysuria and hematuria.       Patient reports dyspareunia in low abdominal, she reports intermittent vaginal irritation and discomfort, denies any vaginal discharge     OBJECTIVE:  Blood pressure 103/63, pulse 69, temperature 98.4 F (36.9 C), temperature source Oral, resp. rate 16, height 5' 3.78" (1.62 m), weight 135 lb (61.2 kg), last menstrual period 03/26/2017, SpO2 100 %.  Physical Exam  Constitutional: She is oriented to person, place, and time and well-developed, well-nourished, and in no distress.  HENT:  Head: Normocephalic and atraumatic.  Right Ear: Hearing, tympanic membrane, external ear and ear canal normal.  Left Ear: Hearing, tympanic membrane, external ear and ear canal normal.  Mouth/Throat: Oropharynx is clear and moist.  Eyes: Pupils are equal, round, and reactive to light. EOM are normal.  Neck: Neck supple. Thyromegaly (mild, nontender, no  nodules palpated) present. No thyroid mass present.  Cardiovascular: Normal rate, regular rhythm and normal heart sounds.  Exam reveals no gallop and no friction rub.   No murmur heard. Pulmonary/Chest: Effort normal and breath sounds normal. She has no wheezes. She has no rales.  Abdominal: Soft. Bowel sounds are normal. She exhibits no distension and no mass. There  is no tenderness.  Genitourinary: Vagina normal, uterus normal, cervix normal, right adnexa normal and left adnexa normal.  Musculoskeletal: Normal range of motion.  Lymphadenopathy:    She has no cervical adenopathy.  Neurological: She is alert and oriented to person, place, and time. Gait normal.  Skin: Skin is warm and dry.    Results for orders placed or performed in visit on 04/13/17  POCT Wet + KOH Prep  Result Value Ref Range   Yeast by KOH Absent Absent   Yeast by wet prep Absent Absent   WBC by wet prep None (A) Few   Clue Cells Wet Prep HPF POC None None   Trich by wet prep Absent Absent   Bacteria Wet Prep HPF POC Many (A) Few   Epithelial Cells By [employer] Pref ([employer]) Moderate (A) None, Few, Too numerous to count   RBC,UR,HPF,POC None None RBC/hpf     ASSESSMENT and PLAN:  1. Encounter for annual routine gynecological examination Pap and routine labs done today. Patient resistant to talking about grief and possible depression /anxiety. Discussed door always open when she is ready.  - CBC with Differential - Comprehensive metabolic panel - Lipid panel - Pap IG, CT/NG w/ reflex HPV when ASC-U - POCT Wet + KOH Prep  3. Hypothyroidism due to Hashimoto's thyroiditis Checking TSH today. Titrate med as appropriate. Brainstormed several possible reminders.  - TSH  1. Need for influenza vaccination Flu vaccine given today. - Flu Vaccine QUAD 36+ mos IM       Rutherford Guys, MD Primary Care at [facility] [place], [place] [address] Ph.  [phone] Fax [phone]

## 2017-04-14 ENCOUNTER — Telehealth: Payer: Self-pay | Admitting: Family Medicine

## 2017-04-14 DIAGNOSIS — D649 Anemia, unspecified: Secondary | ICD-10-CM

## 2017-04-14 DIAGNOSIS — E065 Other chronic thyroiditis: Secondary | ICD-10-CM

## 2017-04-14 LAB — CBC WITH DIFFERENTIAL/PLATELET
Basophils Absolute: 0 10*3/uL (ref 0.0–0.2)
Basos: 0 %
EOS (ABSOLUTE): 0 10*3/uL (ref 0.0–0.4)
Eos: 1 %
Hematocrit: 31.8 % — ABNORMAL LOW (ref 34.0–46.6)
Hemoglobin: 9.6 g/dL — ABNORMAL LOW (ref 11.1–15.9)
Immature Grans (Abs): 0 10*3/uL (ref 0.0–0.1)
Immature Granulocytes: 0 %
Lymphocytes Absolute: 1.6 10*3/uL (ref 0.7–3.1)
Lymphs: 36 %
MCH: 21.7 pg — ABNORMAL LOW (ref 26.6–33.0)
MCHC: 30.2 g/dL — ABNORMAL LOW (ref 31.5–35.7)
MCV: 72 fL — ABNORMAL LOW (ref 79–97)
Monocytes Absolute: 0.3 10*3/uL (ref 0.1–0.9)
Monocytes: 7 %
Neutrophils Absolute: 2.6 10*3/uL (ref 1.4–7.0)
Neutrophils: 56 %
Platelets: 260 10*3/uL (ref 150–379)
RBC: 4.43 x10E6/uL (ref 3.77–5.28)
RDW: 18.2 % — ABNORMAL HIGH (ref 12.3–15.4)
WBC: 4.6 10*3/uL (ref 3.4–10.8)

## 2017-04-14 LAB — COMPREHENSIVE METABOLIC PANEL
ALT: 11 IU/L (ref 0–32)
AST: 22 IU/L (ref 0–40)
Albumin/Globulin Ratio: 1.3 (ref 1.2–2.2)
Albumin: 4.3 g/dL (ref 3.5–5.5)
Alkaline Phosphatase: 39 IU/L (ref 39–117)
BUN/Creatinine Ratio: 11 (ref 9–23)
BUN: 7 mg/dL (ref 6–20)
Bilirubin Total: 0.5 mg/dL (ref 0.0–1.2)
CO2: 24 mmol/L (ref 20–29)
Calcium: 9.3 mg/dL (ref 8.7–10.2)
Chloride: 104 mmol/L (ref 96–106)
Creatinine, Ser: 0.63 mg/dL (ref 0.57–1.00)
GFR calc Af Amer: 139 mL/min/{1.73_m2} (ref >59)
GFR calc non Af Amer: 121 mL/min/{1.73_m2} (ref >59)
Globulin, Total: 3.4 g/dL (ref 1.5–4.5)
Glucose: 84 mg/dL (ref 65–99)
Potassium: 4.7 mmol/L (ref 3.5–5.2)
Sodium: 138 mmol/L (ref 134–144)
Total Protein: 7.7 g/dL (ref 6.0–8.5)

## 2017-04-14 LAB — PAP IG, CT-NG, RFX HPV ASCU
Chlamydia, Nuc. Acid Amp: NEGATIVE
Gonococcus by Nucleic Acid Amp: NEGATIVE
PAP Smear Comment: 0

## 2017-04-14 LAB — LIPID PANEL
Chol/HDL Ratio: 2.3 ratio (ref 0.0–4.4)
Cholesterol, Total: 146 mg/dL (ref 100–199)
HDL: 64 mg/dL (ref >39)
LDL Calculated: 72 mg/dL (ref 0–99)
Triglycerides: 48 mg/dL (ref 0–149)
VLDL Cholesterol Cal: 10 mg/dL (ref 5–40)

## 2017-04-14 LAB — HEMOGLOBIN A1C
Est. average glucose Bld gHb Est-mCnc: 100 mg/dL
Hgb A1c MFr Bld: 5.1 % (ref 4.8–5.6)

## 2017-04-14 LAB — TSH: TSH: 3.38 u[IU]/mL (ref 0.450–4.500)

## 2017-04-14 MED ORDER — FERROUS SULFATE 325 (65 FE) MG PO TABS: 325.0000 mg | ORAL_TABLET | Freq: Every day | ORAL | 3 refills | Status: DC

## 2017-04-14 NOTE — Telephone Encounter (Signed)
Discussed lab results with  Patient. advised not restarting levothyroxine at this point, will cont to monitor. Fu 4 weeks.

## 2017-04-19 ENCOUNTER — Encounter: Payer: Self-pay | Admitting: Family Medicine

## 2017-06-11 ENCOUNTER — Encounter: Payer: Self-pay | Admitting: Family Medicine

## 2017-06-20 ENCOUNTER — Encounter: Payer: Self-pay | Admitting: Family Medicine

## 2017-06-20 ENCOUNTER — Ambulatory Visit (INDEPENDENT_AMBULATORY_CARE_PROVIDER_SITE_OTHER): Payer: Managed Care, Other (non HMO) | Admitting: Family Medicine

## 2017-06-20 ENCOUNTER — Other Ambulatory Visit: Payer: Self-pay

## 2017-06-20 VITALS — BP 104/60 | HR 76 | Temp 98.8°F | Resp 18 | Ht 64.07 in | Wt 132.4 lb

## 2017-06-20 DIAGNOSIS — R109 Unspecified abdominal pain: Secondary | ICD-10-CM | POA: Diagnosis not present

## 2017-06-20 DIAGNOSIS — D649 Anemia, unspecified: Secondary | ICD-10-CM | POA: Diagnosis not present

## 2017-06-20 DIAGNOSIS — N898 Other specified noninflammatory disorders of vagina: Secondary | ICD-10-CM

## 2017-06-20 DIAGNOSIS — R300 Dysuria: Secondary | ICD-10-CM

## 2017-06-20 LAB — CBC WITH DIFFERENTIAL/PLATELET
Basophils Absolute: 0 10*3/uL (ref 0.0–0.2)
Basos: 0 %
EOS (ABSOLUTE): 0.1 10*3/uL (ref 0.0–0.4)
Eos: 1 %
Hematocrit: 35 % (ref 34.0–46.6)
Hemoglobin: 10.3 g/dL — ABNORMAL LOW (ref 11.1–15.9)
Immature Grans (Abs): 0 10*3/uL (ref 0.0–0.1)
Immature Granulocytes: 0 %
Lymphocytes Absolute: 2.7 10*3/uL (ref 0.7–3.1)
Lymphs: 40 %
MCH: 21 pg — ABNORMAL LOW (ref 26.6–33.0)
MCHC: 29.4 g/dL — ABNORMAL LOW (ref 31.5–35.7)
MCV: 71 fL — ABNORMAL LOW (ref 79–97)
Monocytes Absolute: 0.6 10*3/uL (ref 0.1–0.9)
Monocytes: 9 %
Neutrophils Absolute: 3.3 10*3/uL (ref 1.4–7.0)
Neutrophils: 50 %
Platelets: 369 10*3/uL (ref 150–379)
RBC: 4.9 x10E6/uL (ref 3.77–5.28)
RDW: 16.7 % — ABNORMAL HIGH (ref 12.3–15.4)
WBC: 6.7 10*3/uL (ref 3.4–10.8)

## 2017-06-20 LAB — POCT URINALYSIS DIP (MANUAL ENTRY)
Bilirubin, UA: NEGATIVE
Blood, UA: NEGATIVE
Glucose, UA: NEGATIVE mg/dL
Ketones, POC UA: NEGATIVE mg/dL
Leukocytes, UA: NEGATIVE
Nitrite, UA: NEGATIVE
Protein Ur, POC: NEGATIVE mg/dL
Spec Grav, UA: 1.025 (ref 1.010–1.025)
Urobilinogen, UA: 0.2 E.U./dL
pH, UA: 6.5 (ref 5.0–8.0)

## 2017-06-20 LAB — POCT WET + KOH PREP
Trich by wet prep: ABSENT
Yeast by KOH: ABSENT
Yeast by wet prep: ABSENT

## 2017-06-20 NOTE — Progress Notes (Signed)
11/12/20189:44 AM  Andrea Barry 11-13-1985, 31 y.o. female [id number]  Chief Complaint  Patient presents with  . Abdominal Pain    X 1 week  . Back Pain    X 1 week    HPI:   Patient is a 31 y.o. female who presents today for follow-up after being seen over a week ago for pyelonephritis. She completed a 5 day course of cipro 2 days ago. All symptoms resolved except she is still having mid thoracic back pain, bilateral, worse after a long day of standing. Radiates up her back into her neck. She has known scoliosis. Also having vaginal irritation and discharge if a bit off. She is worried kidney infection has not completely cleared.   She has been taking iron as prescribed, tolerating it well.    Depression screen [facility] 2/9 06/20/2017 04/13/2017  Decreased Interest 0 0  Down, Depressed, Hopeless 0 0  PHQ - 2 Score 0 0    Allergies  Allergen Reactions  . Metronidazole Other (See Comments)    Dizziness, taste of iron in mouth, and darkened urine    Prior to Admission medications   Medication Sig Start Date End Date Taking? Authorizing Provider  ferrous sulfate 325 (65 FE) MG tablet Take 1 tablet (325 mg total) by mouth daily with breakfast. 04/14/17  Yes Rutherford Guys, MD    Past Medical History:  Diagnosis Date  . Allergy   . Anemia   . Hypothyroidism    Last TSH (04/2016) was normal, off levothyroxine for months  . Preterm delivery    2nd trimester fetal loss    History reviewed. No pertinent surgical history.  Social History   Tobacco Use  . Smoking status: Never Smoker  . Smokeless tobacco: Never Used  Substance Use Topics  . Alcohol use: No    Family History  Problem Relation Age of Onset  . Diabetes Mother   . Hypertension Mother   . Mental illness Maternal Grandmother   . Hypertension Paternal Grandmother     ROS Per hpi  OBJECTIVE:  Blood pressure 104/60, pulse 76, temperature 98.8 F (37.1 C), temperature source Oral, resp. rate 18,  height 5' 4.07" (1.627 m), weight 132 lb 6.4 oz (60.1 kg), last menstrual period 06/06/2017, SpO2 100 %.  Physical Exam  Constitutional: She is oriented to person, place, and time and well-developed, well-nourished, and in no distress.  HENT:  Head: Normocephalic and atraumatic.  Mouth/Throat: Oropharynx is clear and moist. No oropharyngeal exudate.  Eyes: EOM are normal. Pupils are equal, round, and reactive to light. No scleral icterus.  Neck: Neck supple.  Cardiovascular: Normal rate, regular rhythm and normal heart sounds. Exam reveals no gallop and no friction rub.  No murmur heard. Pulmonary/Chest: Effort normal and breath sounds normal. She has no wheezes. She has no rales.  Abdominal: There is no CVA tenderness.  Musculoskeletal: She exhibits no edema.       Thoracic back: Normal.  Neurological: She is alert and oriented to person, place, and time. Gait normal.  Skin: Skin is warm and dry.      Results for orders placed or performed in visit on 06/20/17 (from the past 24 hour(s))  POCT urinalysis dipstick     Status: None   Collection Time: 06/20/17  9:14 AM  Result Value Ref Range   Color, UA yellow yellow   Clarity, UA clear clear   Glucose, UA negative negative mg/dL   Bilirubin, UA negative negative   Ketones,  POC UA negative negative mg/dL   Spec Grav, UA 1.025 1.010 - 1.025   Blood, UA negative negative   pH, UA 6.5 5.0 - 8.0   Protein Ur, POC negative negative mg/dL   Urobilinogen, UA 0.2 0.2 or 1.0 E.U./dL   Nitrite, UA Negative Negative   Leukocytes, UA Negative Negative  POCT Wet + KOH Prep     Status: Abnormal   Collection Time: 06/20/17  9:41 AM  Result Value Ref Range   Yeast by KOH Absent Absent   Yeast by wet prep Absent Absent   WBC by wet prep Few Few   Clue Cells Wet Prep HPF POC None None   Trich by wet prep Absent Absent   Bacteria Wet Prep HPF POC None (A) Few   Epithelial Cells By [employer] Pref ([employer]) Many (A) None, Few, Too numerous to count    RBC,UR,HPF,POC None None RBC/hpf    No results found.   ASSESSMENT and PLAN  1. Dysuria - POCT urinalysis dipstick - normal  2. Vaginal discharge - POCT Wet + KOH Prep - no infections  3. Anemia, unspecified type Continue with iron supplementation, presumed to be from menometrorrhagia. Discussed treatment options, patient declines at this time. - CBC with Differential  4. Flank pain MSK in nature, discussed conservative measures, OTC pain meds, gentle stretching and strengthening.  Return if symptoms worsen or fail to improve.    Rutherford Guys, MD Primary Care at [facility] [place], [place] [address] Ph.  [phone] Fax [phone]

## 2017-06-20 NOTE — Patient Instructions (Signed)
     IF you received an x-ray today, you will receive an invoice from [facility]. Please contact [facility] at [phone] with questions or concerns regarding your invoice.   IF you received labwork today, you will receive an invoice from [facility]. Please contact [facility] at [phone] with questions or concerns regarding your invoice.   Our billing staff will not be able to assist you with questions regarding bills from these companies.  You will be contacted with the lab results as soon as they are available. The fastest way to get your results is to activate your My Chart account. Instructions are located on the last page of this paperwork. If you have not heard from us regarding the results in 2 weeks, please contact this office.     

## 2017-08-09 NOTE — L&D Delivery Note (Addendum)
Delivery Note At 11:32 AM a viable female was delivered via Vaginal, Spontaneous (Presentation: cephalic; ROA ).  APGAR: 9, 9; weight  .   Placenta status: spontaneous, intact.  Cord: 3VC    Anesthesia:  epidural Episiotomy: None Lacerations: 1st degree perineal;Sulcus Suture Repair: 3.0 vicryl Est. Blood Loss (mL):  600  Mom to postpartum.  Baby to Couplet care / Skin to Skin.   Upon arrival patient was complete and pushing. She pushed with good maternal effort to deliver a healthy baby girl. Baby delivered without difficulty, was noted to have good tone and place on maternal abdomen for oral suctioning, drying and stimulation. Delayed cord clamping performed. Placenta delivered intact with 3V cord.  Pitocin was started and uterus massaged until bleeding slowed. Vaginal canal and perineum was inspected and found to have a sulcus laceration and first degree perineal laceration. Repair of sulcus and perineal laceration was completed using 3-0 vicryl sutures and was hemostatic after repair. Counts of sharps, instruments, and lap pads were all correct.   Matilde Haymaker, MD PGY-1 04/20/2018, 12:10 PM  OB FELLOW DELIVERY ATTESTATION  I was gloved and present for the delivery in its entirety, and I agree with the above resident's note.    Phill Myron, D.O. OB Fellow  04/20/2018, 1:17 PM

## 2017-08-20 ENCOUNTER — Encounter: Payer: Self-pay | Admitting: Family Medicine

## 2017-08-25 ENCOUNTER — Encounter: Payer: Self-pay | Admitting: Family Medicine

## 2017-08-25 ENCOUNTER — Other Ambulatory Visit: Payer: Self-pay

## 2017-08-25 ENCOUNTER — Ambulatory Visit (INDEPENDENT_AMBULATORY_CARE_PROVIDER_SITE_OTHER): Payer: Managed Care, Other (non HMO) | Admitting: Family Medicine

## 2017-08-25 VITALS — BP 118/60 | HR 108 | Temp 99.0°F | Resp 18 | Ht 64.07 in | Wt 134.2 lb

## 2017-08-25 DIAGNOSIS — Z3200 Encounter for pregnancy test, result unknown: Secondary | ICD-10-CM

## 2017-08-25 DIAGNOSIS — Z3201 Encounter for pregnancy test, result positive: Secondary | ICD-10-CM | POA: Diagnosis not present

## 2017-08-25 LAB — POCT URINE PREGNANCY: Preg Test, Ur: POSITIVE — AB

## 2017-08-25 NOTE — Progress Notes (Signed)
1/17/20198:52 AM  Andrea Barry 01-Dec-1985, 32 y.o. female [id number]  Chief Complaint  Patient presents with  . Possible Pregnancy    Pt states hasn't missed this month period yet but took a pregnancy test on Saturday and it was positive. Pt states she isn't having any symptoms yet.    HPI:   Patient is a 32 y.o. female with past medical history significant for recurring 2T fetal loss and h/o abnormal thyroid dysfunction who presents today for confirmation of positive pregnancy test.  Patient is a G3P0030, LMP ~ 07/18/17, who has been actively trying to conceive, taking prenatal vitamins.  She has been having sexual intercourse during her fertile days per phone app Menses are regular Has noticed breast heaviness and fatigue, home pregnancy test positive Last TSH in 04/2017 normal off meds Has had flu vaccine this season  Depression screen [facility] 2/9 08/25/2017 06/20/2017 04/13/2017  Decreased Interest 0 0 0  Down, Depressed, Hopeless 0 0 0  PHQ - 2 Score 0 0 0    Allergies  Allergen Reactions  . Metronidazole Other (See Comments)    Dizziness, taste of iron in mouth, and darkened urine    Prior to Admission medications   Medication Sig Start Date End Date Taking? Authorizing Provider  Ascorbic Acid (VITAMIN C GUMMIE PO) Take by mouth.   Yes [provider]  ferrous sulfate 325 (65 FE) MG tablet Take 1 tablet (325 mg total) by mouth daily with breakfast. 04/14/17  Yes Rutherford Guys, MD    Past Medical History:  Diagnosis Date  . Allergy   . Anemia   . Hypothyroidism    Last TSH (04/2017) was normal, off levothyroxine for months  . Preterm delivery    2nd trimester fetal loss    History reviewed. No pertinent surgical history.  Social History   Tobacco Use  . Smoking status: Never Smoker  . Smokeless tobacco: Never Used  Substance Use Topics  . Alcohol use: No    Family History  Problem Relation Age of Onset  . Diabetes Mother   . Hypertension  Mother   . Mental illness Maternal Grandmother   . Hypertension Paternal Grandmother     ROS Per hpi  OBJECTIVE:  Blood pressure 118/60, pulse (!) 108, temperature 99 F (37.2 C), temperature source Oral, resp. rate 18, height 5' 4.07" (1.627 m), weight 134 lb 3.2 oz (60.9 kg), SpO2 100 %.  Physical Exam  Constitutional: She is oriented to person, place, and time and well-developed, well-nourished, and in no distress.  HENT:  Head: Normocephalic and atraumatic.  Mouth/Throat: Mucous membranes are normal.  Eyes: EOM are normal. Pupils are equal, round, and reactive to light. No scleral icterus.  Neck: Neck supple.  Pulmonary/Chest: Effort normal.  Neurological: She is alert and oriented to person, place, and time. Gait normal.  Skin: Skin is warm and dry.  Psychiatric: Mood and affect normal.  Nursing note and vitals reviewed.     Results for orders placed or performed in visit on 08/25/17 (from the past 24 hour(s))  POCT urine pregnancy     Status: Abnormal   Collection Time: 08/25/17  8:35 AM  Result Value Ref Range   Preg Test, Ur Positive (A) Negative    ASSESSMENT and PLAN  1. Positive urine pregnancy test 2. Possible pregnancy - POCT urine pregnancy  Patient already on prenatal vitamins. Advised establishing with ob-gyn within the next 2 weeks. Routine first trimester precautions reviewed.  Return if symptoms worsen  or fail to improve.   Rutherford Guys, MD Primary Care at [facility] [place], [place] [address] Ph.  [phone] Fax [phone]

## 2017-08-25 NOTE — Patient Instructions (Addendum)
IF you received an x-ray today, you will receive an invoice from [facility]iology. Please contact [facility] at [phone] with questions or concerns regarding your invoice.   IF you received labwork today, you will receive an invoice from [facility]. Please contact [facility] at [phone] with questions or concerns regarding your invoice.   Our billing staff will not be able to assist you with questions regarding bills from these companies.  You will be contacted with the lab results as soon as they are available. The fastest way to get your results is to activate your My Chart account. Instructions are located on the last page of this paperwork. If you have not heard from [place] regarding the results in 2 weeks, please contact this office.    Informacin sobre la prueba de Media planner Pregnancy Test Information En qu consiste una prueba de [place]? La prueba de embarazo se [place] para Product manager presencia de gonadotropina corinica humana [facility]) en Truddie Coco de [place] o de [place]. La [facility] es una hormona que producen las clulas de la placenta. La placenta es el rgano que se forma como apoyo vital del beb en desarrollo y para satisfacer sus necesidades de nutricin. Para esta prueba, se requiere [place] de [place] u [place]. La prueba de embarazo determina si est embarazada o no. Cmo se realizan las pruebas de [place]? Las pruebas de Media planner se Insurance underwriter mediante una prueba de Media planner casera o un anlisis de sangre u orina que se realiza en el consultorio del mdico. Las pruebas de Media planner caseras requieren [place] de [place].  La mayora de los kits utilizan un dispositivo de prueba de plstico con una tira reactiva de papel, que indica si hay presencia de [facility] en la orina.  Siga muy cuidadosamente las instrucciones de la prueba.  Despus de [employer] varilla de la prueba, aparecern marcas que le informarn si est embarazada o no.  Para obtener [employer], use la primera orina de la [place], que es cuando la concentracin de [facility] es ms elevada.  El anlisis de sangre para Hydrographic surveyor un Media planner requiere la extraccin de [place] de sangre de una vena de la mano o el brazo. El mdico enviar su Jeri Lager a un laboratorio para su anlisis. Los [employer] de la prueba de Media planner sern positivos o Film/video editor. Un tipo de prueba de embarazo es mejor que otro? En algunos casos, puede suceder que la prueba de [place] arroje un resultado negativo y el anlisis de sangre uno positivo. Esto sucede porque los anlisis de sangre son ms precisos. Esto significa que los anlisis de sangre pueden Product manager [facility] antes que las pruebas de embarazo caseras. Qu precisin tienen las pruebas de embarazo caseras? Ambos tipos de pruebas de [place] son muy precisos.  Un anlisis de sangre tiene una precisin del 98%.  Cuando el embarazo est muy avanzado y si se usan correctamente, las pruebas de [place] caseras son igualmente precisas.  Existen factores que pueden interferir con los resultados de la prueba de Media planner? Es posible que algunas condiciones causen un resultado impreciso (positivo falso o negativofalso).  Un resultado positivo falso es un resultado positivo cuando no est embarazada. Esto puede suceder si usted: ? Est tomando determinados medicamentos, incluidos los anticonvulsivos o tranquilizantes. ? Tiene ciertas protenas en la sangre.  Un resultado negativo falso es un resultado negativo cuando efectivamente s est embarazada. Esto puede suceder si usted: ? Se realiz la prueba antes de que hubiera suficiente [facility] para  detectar. En la [employer], el resultado de la prueba de Media planner no ser positivo hasta 3 o 4 semanas despus de la concepcin. ? Bebi mucho lquido antes de la prueba. Las muestras de [place] diluida a veces pueden arrojar un resultado impreciso. ? Toma algunos medicamentos, como diurticos o algunos  antihistamnicos.  Qu debo hacer si el resultado de mi prueba de [place] es positivo? Si el resultado de su prueba de Media planner es positivo, programe una cita con su mdico. Podra necesitar pruebas adicionales para Nurse, mental health. Mientras tanto, comience a tomar vitaminas prenatales, deje de fumar y de tomar alcohol, y no use drogas ilcitas. Hable con su mdico acerca de cmo cuidarse durante el embarazo. Pregntele sobre la atencin que necesitar durante todo el [place] (cuidado prenatal). Esta informacin no tiene Marine scientist el consejo del mdico. Asegrese de hacerle al mdico cualquier pregunta que tenga. Document Released: 07/12/2012 Document Revised: 10/14/2016 Document Reviewed: 11/20/2013 Elsevier Interactive Patient Education  [employer].

## 2017-09-05 ENCOUNTER — Encounter: Payer: Self-pay | Admitting: Family Medicine

## 2017-09-07 DIAGNOSIS — Z3490 Encounter for supervision of normal pregnancy, unspecified, unspecified trimester: Secondary | ICD-10-CM | POA: Insufficient documentation

## 2017-09-15 ENCOUNTER — Other Ambulatory Visit (HOSPITAL_COMMUNITY): Payer: Self-pay | Admitting: Nurse Practitioner

## 2017-09-15 DIAGNOSIS — Z3A13 13 weeks gestation of pregnancy: Secondary | ICD-10-CM

## 2017-09-15 DIAGNOSIS — Z3682 Encounter for antenatal screening for nuchal translucency: Secondary | ICD-10-CM

## 2017-09-15 DIAGNOSIS — Z369 Encounter for antenatal screening, unspecified: Secondary | ICD-10-CM

## 2017-09-15 LAB — OB RESULTS CONSOLE ANTIBODY SCREEN: Antibody Screen: NEGATIVE

## 2017-09-15 LAB — GLUCOSE, 1 HOUR: GLUCOSE 1 HOUR: 146

## 2017-09-15 LAB — OB RESULTS CONSOLE PLATELET COUNT: Platelets: 288

## 2017-09-15 LAB — CYSTIC FIBROSIS DIAGNOSTIC STUDY: INTERPRETATION-CFDNA: NEGATIVE

## 2017-09-15 LAB — CULTURE, OB URINE: URINE CULTURE, OB: NO GROWTH

## 2017-09-15 LAB — OB RESULTS CONSOLE ABO/RH: RH Type: POSITIVE

## 2017-09-15 LAB — OB RESULTS CONSOLE HIV ANTIBODY (ROUTINE TESTING): HIV: NONREACTIVE

## 2017-09-15 LAB — OB RESULTS CONSOLE GC/CHLAMYDIA
CHLAMYDIA, DNA PROBE: NEGATIVE
GC PROBE AMP, GENITAL: NEGATIVE

## 2017-09-15 LAB — OB RESULTS CONSOLE RPR: RPR: NONREACTIVE

## 2017-09-15 LAB — OB RESULTS CONSOLE VARICELLA ZOSTER ANTIBODY, IGG: Varicella: IMMUNE

## 2017-09-15 LAB — OB RESULTS CONSOLE RUBELLA ANTIBODY, IGM: RUBELLA: IMMUNE

## 2017-09-15 LAB — OB RESULTS CONSOLE HGB/HCT, BLOOD
HCT: 32
Hemoglobin: 10.1

## 2017-09-15 LAB — OB RESULTS CONSOLE HEPATITIS B SURFACE ANTIGEN: Hepatitis B Surface Ag: NEGATIVE

## 2017-09-19 LAB — GLUCOSE, 3 HOUR

## 2017-10-03 ENCOUNTER — Encounter: Payer: Self-pay | Admitting: *Deleted

## 2017-10-04 DIAGNOSIS — O0990 Supervision of high risk pregnancy, unspecified, unspecified trimester: Secondary | ICD-10-CM

## 2017-10-10 ENCOUNTER — Encounter (HOSPITAL_COMMUNITY): Payer: Self-pay | Admitting: Nurse Practitioner

## 2017-10-13 ENCOUNTER — Encounter: Payer: Self-pay | Admitting: Obstetrics & Gynecology

## 2017-10-13 ENCOUNTER — Ambulatory Visit (INDEPENDENT_AMBULATORY_CARE_PROVIDER_SITE_OTHER): Payer: Managed Care, Other (non HMO) | Admitting: Obstetrics & Gynecology

## 2017-10-13 VITALS — BP 114/72 | HR 96 | Wt 135.5 lb

## 2017-10-13 DIAGNOSIS — O3433 Maternal care for cervical incompetence, third trimester: Secondary | ICD-10-CM | POA: Insufficient documentation

## 2017-10-13 DIAGNOSIS — O3432 Maternal care for cervical incompetence, second trimester: Secondary | ICD-10-CM

## 2017-10-13 DIAGNOSIS — O0991 Supervision of high risk pregnancy, unspecified, first trimester: Secondary | ICD-10-CM

## 2017-10-13 DIAGNOSIS — O0990 Supervision of high risk pregnancy, unspecified, unspecified trimester: Secondary | ICD-10-CM

## 2017-10-13 DIAGNOSIS — O3431 Maternal care for cervical incompetence, first trimester: Secondary | ICD-10-CM

## 2017-10-13 DIAGNOSIS — O3430 Maternal care for cervical incompetence, unspecified trimester: Secondary | ICD-10-CM

## 2017-10-13 HISTORY — DX: Maternal care for cervical incompetence, second trimester: O34.32

## 2017-10-13 NOTE — Progress Notes (Signed)
Medicaid Home Form signed

## 2017-10-13 NOTE — Progress Notes (Signed)
  Subjective:referred by HD with h/o incompetent cervix    Andrea Barry is a [id number] [redacted]w[redacted]d being seen today for her first obstetrical visit.  Her obstetrical history is significant for second trimester loss. Patient does intend to breast feed. Pregnancy history fully reviewed.  Patient reports mild cramping.  Vitals:   10/13/17 1404  BP: 114/72  Pulse: 96  Weight: 135 lb 8 oz (61.5 kg)    HISTORY: OB History  Gravida Para Term Preterm AB Living  4 1 0 1 2 0  SAB TAB Ectopic Multiple Live Births  2 0 0 0 0    # Outcome Date GA Lbr Len/2nd Weight Sex Delivery Anes PTL Lv  4 Current           3 Preterm 2017        FD  2 SAB 2015          1 SAB 2014             Past Medical History:  Diagnosis Date  . Allergy   . Anemia   . Hypothyroidism    Last TSH (04/2017) was normal, off levothyroxine for months  . Preterm delivery    2nd trimester fetal loss   Past Surgical History:  Procedure Laterality Date  . NO PAST SURGERIES     Family History  Problem Relation Age of Onset  . Diabetes Mother   . Hypertension Mother   . Mental illness Maternal Grandmother   . Hypertension Paternal Grandmother      Exam    Uterus:   13 week  Pelvic Exam:                                    Skin: normal coloration and turgor, no rashes    Neurologic: oriented, normal mood   Extremities: normal strength, tone, and muscle mass   HEENT extra ocular movement intact   Mouth/Teeth dental hygiene good   Neck supple   Cardiovascular: regular rate and rhythm   Respiratory:  appears well, vitals normal, no respiratory distress, acyanotic, normal RR, ear and throat exam is normal   Abdomen: soft, non-tender; bowel sounds normal; no masses,  no organomegaly   Urinary:        Assessment:    Pregnancy: [id number] Patient Active Problem List   Diagnosis Date Noted  . Cervical incompetence affecting pregnancy, antepartum 10/13/2017  . Supervision of high risk pregnancy, antepartum  10/04/2017  . Premature labor before [redacted] weeks gestation 07/22/2016  . Fetal demise before 22 weeks with retention of dead fetus 2016-08-06        Plan:     Initial labs drawn. Prenatal vitamins. Problem list reviewed and updated. Genetic Screening discussed .  Ultrasound discussed; fetal survey: 18 weeks.  Follow up in 2 weeks. 50% of 30 min visit spent on counseling and coordination of care.  Offered cerclage which can be scheduled once dating is confirmed.   Emeterio Reeve 10/13/2017

## 2017-10-13 NOTE — Patient Instructions (Signed)
Cerclaje cervical (Cervical Cerclage) El cerclaje cervical es un procedimiento quirrgico para solucionar un cuello del tero incompetente. Un cuello del tero incompetente es un cuello dbil que se abre antes de que comience el trabajo de [place]. El cerclaje cervical es un procedimiento en el que se sutura el cuello del tero para cerrarlo durante el [place].  INFORME A SU MDICO:   Cualquier alergia que tenga.  Todos los [employer], incluidos vitaminas, hierbas, gotas oftlmicas, cremas y medicamentos de venta libre.  Problemas previos que usted o los [employer] de su familia hayan tenido con el uso de anestsicos.  Enfermedades de [employer].  Cirugas previas.  Afecciones mdicas que tenga.  Resfros o infecciones recientes. RIESGOS Y COMPLICACIONES  En general, se trata de un procedimiento seguro. Sin embargo, Engineer, technical sales, pueden surgir problemas. Estos son algunos posibles problemas:  Infeccin.  Hemorragias.  Ruptura del saco amnitico ([place]).  Comenzar el [place] de parto y el parto de forma prematura.  Problemas con la anestesia.  Infeccin del saco amnitico. ANTES DEL PROCEDIMIENTO   Consulte a su mdico si debe cambiar o suspender sus medicamentos.  No coma ni beba nada durante las 6 a 8horas previas al procedimiento.  Pdale a alguna persona que la lleve a su casa luego del procedimiento. PROCEDIMIENTO   Se le colocar una sonda intravenosa en una de las venas. Le darn un sedante para que pueda relajarse.  Le aplicarn un medicamento que la har dormir durante el procedimiento (anestesia general) o le inyectarn un medicamento para adormecer la zona de la cintura para abajo (anestesia espinal o anestesia epidural). Usted dormir o tendr el cuerpo adormecido durante todo el procedimiento.  Le colocarn un espculo en la vagina para visualizar el cuello del tero.  Luego el cuello del tero se toma y se sutura de [place]  apretada.  Podrn utilizar una ecografa para guiar el procedimiento y Chief Technology Officer al beb. DESPUS DEL PROCEDIMIENTO   La llevarn a una sala de recuperacin y controlarn al beb que an no ha nacido. Una vez que despierte, se encuentre estabilizada y pueda ingerir lquidos sin problemas, podr volver a su habitacin.  [place] seismologist hospital durante la noche.  Le aplicarn una inyeccin de progesterona para evitar las contracciones uterinas.  Le darn analgsicos para que tome en su casa.  Pdale a alguna persona que la lleve de vuelta a su casa y que permanezca con usted durante 2 das. Esta informacin no tiene Marine scientist el consejo del mdico. Asegrese de hacerle al mdico cualquier pregunta que tenga. Document Released: 10/22/2008 Document Revised: 07/31/2013 Elsevier Interactive Patient Education  2017 [employer].

## 2017-10-14 ENCOUNTER — Encounter: Payer: Self-pay | Admitting: *Deleted

## 2017-10-14 LAB — TSH: TSH: 2.56 u[IU]/mL (ref 0.450–4.500)

## 2017-10-18 ENCOUNTER — Encounter (HOSPITAL_COMMUNITY): Payer: Self-pay

## 2017-10-18 ENCOUNTER — Ambulatory Visit (HOSPITAL_COMMUNITY)
Admission: RE | Admit: 2017-10-18 | Discharge: 2017-10-18 | Disposition: A | Discharge status: Home / Self Care | Payer: Managed Care, Other (non HMO) | Source: Ambulatory Visit | Attending: Nurse Practitioner | Admitting: Nurse Practitioner

## 2017-10-18 ENCOUNTER — Ambulatory Visit (HOSPITAL_COMMUNITY): Admission: RE | Admit: 2017-10-18 | Payer: Managed Care, Other (non HMO) | Source: Ambulatory Visit

## 2017-10-18 DIAGNOSIS — O09211 Supervision of pregnancy with history of pre-term labor, first trimester: Secondary | ICD-10-CM | POA: Diagnosis not present

## 2017-10-18 DIAGNOSIS — Z3682 Encounter for antenatal screening for nuchal translucency: Secondary | ICD-10-CM | POA: Insufficient documentation

## 2017-10-18 DIAGNOSIS — O0990 Supervision of high risk pregnancy, unspecified, unspecified trimester: Secondary | ICD-10-CM

## 2017-10-18 DIAGNOSIS — E039 Hypothyroidism, unspecified: Secondary | ICD-10-CM | POA: Diagnosis not present

## 2017-10-18 DIAGNOSIS — O99281 Endocrine, nutritional and metabolic diseases complicating pregnancy, first trimester: Secondary | ICD-10-CM | POA: Insufficient documentation

## 2017-10-18 DIAGNOSIS — Z3A13 13 weeks gestation of pregnancy: Secondary | ICD-10-CM | POA: Insufficient documentation

## 2017-10-18 DIAGNOSIS — Z369 Encounter for antenatal screening, unspecified: Secondary | ICD-10-CM

## 2017-10-21 ENCOUNTER — Encounter (HOSPITAL_COMMUNITY): Payer: Self-pay

## 2017-10-21 ENCOUNTER — Ambulatory Visit (HOSPITAL_COMMUNITY)
Admission: RE | Admit: 2017-10-21 | Discharge: 2017-10-21 | Disposition: A | Discharge status: Home / Self Care | Payer: Managed Care, Other (non HMO) | Source: Ambulatory Visit | Attending: Nurse Practitioner | Admitting: Nurse Practitioner

## 2017-10-21 ENCOUNTER — Other Ambulatory Visit (HOSPITAL_COMMUNITY): Payer: Self-pay | Admitting: *Deleted

## 2017-10-21 DIAGNOSIS — O0990 Supervision of high risk pregnancy, unspecified, unspecified trimester: Secondary | ICD-10-CM

## 2017-10-21 DIAGNOSIS — Z3A13 13 weeks gestation of pregnancy: Secondary | ICD-10-CM | POA: Diagnosis not present

## 2017-10-21 DIAGNOSIS — O2621 Pregnancy care for patient with recurrent pregnancy loss, first trimester: Secondary | ICD-10-CM | POA: Insufficient documentation

## 2017-10-21 DIAGNOSIS — O26879 Cervical shortening, unspecified trimester: Secondary | ICD-10-CM

## 2017-10-21 NOTE — Consult Note (Signed)
Maternal Fetal Medicine Consultation  I had the pleasure of seeing your patient Luisana Lutzke for Maternal-Fetal Medicine consultation on 10/21/2017. As you know, Dorean is a 32 y.o. G4P0120 at [redacted]w[redacted]d who presents for consultation regarding a history of second trimester loss.  Loris's obstetric history is notable for two first trimester spontaneous abortions. In 2017 at [redacted]w[redacted]d she presented with abdominal pain and was found to be fully dilated with the fetal head in the vagina. She progressed to a spontaneous vaginal delivery of a live previable neonate who expire shortly thereafter. Placenta pathology showed acute chorioamnionitis. Patra reports that a week prior to delivery she present to urgent care with pressure and pain but was told this was normal in pregnancy.   Jenica feels well today. She denies pressure, cramping, or bleeding. She sometimes experiences cramping at night.   The remainder of Karlynn's medical history is notable for hypothyroidism. She has no past surgical history. She takes prenatal vitamins, tylenol, vitamin c, iron and claritin and is allergic to metronidazole. Briceida denies alcohol, tobacco or other drug use. Her family history is unremarkable.  We discussed the clinical evidence supporting the role of 17 alpha-hydroxyprogesterone caproate therapy in preventing prematurity.  Clinical evidence supports a reduction in prematurity recurrence by approximately one-third with this therapy.  17 alpha-hydroxyprogesterone caproate is administered as 250 mg of the drug in one cc of oil injected intramuscularly.  The recommended regimen is 1 IM injection per week beginning at approximately 16 weeks of pregnancy and continuing through 36 weeks or delivery, whichever comes first.  The maternal side effect profile of 17 alpha-hydroxyprogesterone caproate is mostly related to its route of administration.  These side effects include bleeding, hematoma formation, local site  irritation and local infection.  Clinical evidence to date has not identified any clear neonatal side effects from 17 alpha-hydroxyprogesterone caproate therapy.  Based on today's discussion, Theona elects to receive 17 alpha-hydroxyprogesterone therapy, to be arranged through your office.   Given her history of second trimester loss, I discussed the options of history indicated cerclage versus serial ultrasound monitoring of cervical length with cerclage if the cervix measures less than 2.5cm. I discussed the risks and benefits of each approach. Following our discussion Everline would like to defer cerclage for now but accepts cervical length ultrasound screening. I have arranged for her first cervical length ultrasound in three weeks to be continued every other week until 24 weeks or if cervical shortening is noted.  Thank you for the opportunity to be a part of the care of [facility] Please contact our office if we can be of further assistance.   I spent approximately 40 minutes with this patient with over 50% of time spent in face-to-face counseling.  Abram Sander, MD Maternal-Fetal Medicine

## 2017-10-25 ENCOUNTER — Encounter: Payer: Self-pay | Admitting: *Deleted

## 2017-10-26 ENCOUNTER — Ambulatory Visit (INDEPENDENT_AMBULATORY_CARE_PROVIDER_SITE_OTHER): Payer: Managed Care, Other (non HMO) | Admitting: Family Medicine

## 2017-10-26 VITALS — Wt 135.3 lb

## 2017-10-26 DIAGNOSIS — O3430 Maternal care for cervical incompetence, unspecified trimester: Secondary | ICD-10-CM

## 2017-10-26 DIAGNOSIS — O0990 Supervision of high risk pregnancy, unspecified, unspecified trimester: Secondary | ICD-10-CM

## 2017-10-26 NOTE — Progress Notes (Signed)
   PRENATAL VISIT NOTE  Subjective:  Andrea Barry is a 32 y.o. G4P0120 at [redacted]w[redacted]d being seen today for ongoing prenatal care.  She is currently monitored for the following issues for this high-risk pregnancy and has Fetal demise before 22 weeks with retention of dead fetus; Premature labor before [redacted] weeks gestation; Supervision of high risk pregnancy, antepartum; and Cervical incompetence affecting pregnancy, antepartum on their problem list.  Patient reports no complaints.   . Vag. Bleeding: None.   . Denies leaking of fluid.   The following portions of the patient's history were reviewed and updated as appropriate: allergies, current medications, past family history, past medical history, past social history, past surgical history and problem list. Problem list updated.  Objective:   Vitals:   10/26/17 1626  Weight: 135 lb 4.8 oz (61.4 kg)     General:  Alert, oriented and cooperative. Patient is in no acute distress.  Skin: Skin is warm and dry. No rash noted.   Cardiovascular: Normal heart rate noted  Respiratory: Normal respiratory effort, no problems with respiration noted  Abdomen: Soft, gravid, appropriate for gestational age.  Pain/Pressure: Absent     Pelvic: Cervical exam deferred        Extremities: Normal range of motion.  Edema: None  Mental Status:  Normal mood and affect. Normal behavior. Normal judgment and thought content.   Assessment and Plan:  Pregnancy: [id number] at [redacted]w[redacted]d  1. Cervical incompetence affecting pregnancy, antepartum MFM following. Will start 17-P at 16 weeks and plans for bi-weekly ultrasounds for cervical length. No cerclage at this time  2. Supervision of high risk pregnancy, antepartum Unable to find heart tones with doppler. Bedside US showed FHT at about 150 bpm and baby moving well.  Preterm labor symptoms and general obstetric precautions including but not limited to vaginal bleeding, contractions, leaking of fluid and fetal movement were  reviewed in detail with the patient. Please refer to After Visit Summary for other counseling recommendations.  Return in about 4 weeks (around 11/23/2017).   Dannielle Huh, DO

## 2017-10-26 NOTE — Patient Instructions (Signed)
Hydroxyprogesterone solution for injection What is this medicine? HYDROXYPROGESTERONE (hye drox ee proe JES ter one) is a female hormone. This medicine is used in women who are pregnant and who have delivered a baby too early (preterm) in the past. It helps lower the risk of having a preterm baby again. This medicine may be used for other purposes; ask your health care provider or pharmacist if you have questions. COMMON BRAND NAME(S): Makena What should I tell my health care provider before I take this medicine? They need to know if you have any of these conditions: -blood clotting disorders -breast, cervical, uterine, or vaginal cancer -depression -diabetes or prediabetes -heart disease -high blood pressure -kidney disease -liver disease -lung or breathing disease, like asthma -migraine headaches -seizures -vaginal bleeding -an unusual or allergic reaction to hydroxyprogesterone, other hormones, medicines, foods, dyes, castor oil, benzyl alcohol, or other preservatives -breast-feeding How should I use this medicine? This medicine is for injection into a muscle. It is given by a health care professional in a hospital or clinic setting. You are likely to get an injection once a week to prevent preterm delivery. Talk to your pediatrician regarding the use of this medicine in children. Special care may be needed. Overdosage: If you think you have taken too much of this medicine contact a poison control center or emergency room at once. NOTE: This medicine is only for you. Do not share this medicine with others. What if I miss a dose? It is important not to miss your dose. Call your doctor or health care professional if you are unable to keep an appointment. What may interact with this medicine? -acetaminophen -bupropion -clozapine -efavirenz -halothane -methadone -nicotine -theophylline, aminophylline -tizanidine This list may not describe all possible interactions. Give your  health care provider a list of all the medicines, herbs, non-prescription drugs, or dietary supplements you use. Also tell them if you smoke, drink alcohol, or use illegal drugs. Some items may interact with your medicine. What should I watch for while using this medicine? Your condition will be monitored carefully while you are receiving this medicine. What side effects may I notice from receiving this medicine? Side effects that you should report to your doctor or health care professional as soon as possible: -allergic reactions like skin rash, itching or hives, swelling of the face, lips, or tongue -breathing problems -breast tissue changes or discharge -changes in vision -confusion, trouble speaking or understanding -depressed mood -increased hunger or thirst -increased urination -pain, redness, or irritation at site where injected -pain, swelling, warmth in the leg -shortness of breath, chest pain, swelling in a leg -sudden numbness or weakness of the face, arm or leg -sudden severe headaches -trouble walking, dizziness, loss of balance or coordination -unusually weak or tired -vaginal bleeding -yellowing of the eyes or skin Side effects that usually do not require medical attention (report to your doctor or health care professional if they continue or are bothersome): -changes in emotions or moods -diarrhea -fluid retention and swelling -nausea This list may not describe all possible side effects. Call your doctor for medical advice about side effects. You may report side effects to FDA at 1-800-FDA-1088. Where should I keep my medicine? This drug is given in a hospital or clinic and will not be stored at home. NOTE: This sheet is a summary. It may not cover all possible information. If you have questions about this medicine, talk to your doctor, pharmacist, or health care provider.  2018 Elsevier/Gold Standard (2009-09-16 11:17:12)

## 2017-10-27 ENCOUNTER — Other Ambulatory Visit (HOSPITAL_COMMUNITY): Payer: Self-pay

## 2017-11-11 ENCOUNTER — Other Ambulatory Visit (HOSPITAL_COMMUNITY): Payer: Self-pay | Admitting: *Deleted

## 2017-11-11 ENCOUNTER — Encounter (HOSPITAL_COMMUNITY): Payer: Self-pay

## 2017-11-11 ENCOUNTER — Ambulatory Visit (HOSPITAL_COMMUNITY)
Admission: RE | Admit: 2017-11-11 | Discharge: 2017-11-11 | Disposition: A | Discharge status: Home / Self Care | Payer: Managed Care, Other (non HMO) | Source: Ambulatory Visit | Attending: Nurse Practitioner | Admitting: Nurse Practitioner

## 2017-11-11 DIAGNOSIS — O26872 Cervical shortening, second trimester: Secondary | ICD-10-CM | POA: Insufficient documentation

## 2017-11-11 DIAGNOSIS — O09219 Supervision of pregnancy with history of pre-term labor, unspecified trimester: Principal | ICD-10-CM

## 2017-11-11 DIAGNOSIS — O26879 Cervical shortening, unspecified trimester: Secondary | ICD-10-CM

## 2017-11-11 DIAGNOSIS — O09899 Supervision of other high risk pregnancies, unspecified trimester: Secondary | ICD-10-CM

## 2017-11-11 DIAGNOSIS — Z3A16 16 weeks gestation of pregnancy: Secondary | ICD-10-CM | POA: Diagnosis not present

## 2017-11-23 ENCOUNTER — Ambulatory Visit (INDEPENDENT_AMBULATORY_CARE_PROVIDER_SITE_OTHER): Payer: Managed Care, Other (non HMO) | Admitting: Obstetrics & Gynecology

## 2017-11-23 VITALS — BP 114/63 | HR 87 | Wt 141.4 lb

## 2017-11-23 DIAGNOSIS — O0992 Supervision of high risk pregnancy, unspecified, second trimester: Secondary | ICD-10-CM

## 2017-11-23 DIAGNOSIS — O3430 Maternal care for cervical incompetence, unspecified trimester: Secondary | ICD-10-CM

## 2017-11-23 DIAGNOSIS — O3432 Maternal care for cervical incompetence, second trimester: Secondary | ICD-10-CM

## 2017-11-23 DIAGNOSIS — O0990 Supervision of high risk pregnancy, unspecified, unspecified trimester: Secondary | ICD-10-CM

## 2017-11-23 MED ORDER — PRENATAL PLUS 27-1 MG PO TABS: 1.0000 | ORAL_TABLET | Freq: Every day | ORAL | 6 refills | Status: DC

## 2017-11-23 MED ORDER — HYDROXYPROGESTERONE CAPROATE 275 MG/1.1ML ~~LOC~~ SOAJ
275.0000 mg | SUBCUTANEOUS | Status: AC
Start: 2017-11-23 — End: 2018-03-29
  Administered 2017-12-29 – 2018-03-29 (×12): 275 mg via SUBCUTANEOUS

## 2017-11-23 NOTE — Progress Notes (Signed)
   PRENATAL VISIT NOTE  Subjective:  Andrea Barry is a 32 y.o. G4P0120 at [redacted]w[redacted]d being seen today for ongoing prenatal care.  She is currently monitored for the following issues for this high-risk pregnancy and has Fetal demise before 22 weeks with retention of dead fetus; Premature labor before [redacted] weeks gestation; Supervision of high risk pregnancy, antepartum; and Cervical incompetence affecting pregnancy, antepartum on their problem list.  Patient reports no complaints.  Contractions: Not present. Vag. Bleeding: None.  Movement: Absent. Denies leaking of fluid.   The following portions of the patient's history were reviewed and updated as appropriate: allergies, current medications, past family history, past medical history, past social history, past surgical history and problem list. Problem list updated.  Objective:   Vitals:   11/23/17 1616  BP: 114/63  Pulse: 87  Weight: 141 lb 6.4 oz (64.1 kg)    Fetal Status: Fetal Heart Rate (bpm): 153   Movement: Absent     General:  Alert, oriented and cooperative. Patient is in no acute distress.  Skin: Skin is warm and dry. No rash noted.   Cardiovascular: Normal heart rate noted  Respiratory: Normal respiratory effort, no problems with respiration noted  Abdomen: Soft, gravid, appropriate for gestational age.  Pain/Pressure: Present     Pelvic: Cervical exam deferred        Extremities: Normal range of motion.  Edema: None  Mental Status: Normal mood and affect. Normal behavior. Normal judgment and thought content.   Assessment and Plan:  Pregnancy: [id number] at [redacted]w[redacted]d  1. Cervical incompetence affecting pregnancy, antepartum Reviewed Dr Arnoldo Lenis note that states she was counseled and the plan is to start !& P weekly and follow cervical length with serial [place], with f/u scheduled in 2 days - prenatal vitamin w/FE, FA (PRENATAL 1 + 1) 27-1 MG TABS tablet; Take 1 tablet by mouth daily at 12 noon.  Dispense: 30 tablet; Refill: 6 -  HYDROXYprogesterone Caproate SOAJ 275 mg  2. Supervision of high risk pregnancy, antepartum  - prenatal vitamin w/FE, FA (PRENATAL 1 + 1) 27-1 MG TABS tablet; Take 1 tablet by mouth daily at 12 noon.  Dispense: 30 tablet; Refill: 6 - HYDROXYprogesterone Caproate SOAJ 275 mg  Preterm labor symptoms and general obstetric precautions including but not limited to vaginal bleeding, contractions, leaking of fluid and fetal movement were reviewed in detail with the patient. Please refer to After Visit Summary for other counseling recommendations.  Return in about 1 month (around 12/21/2017) for needs 17 P weekly.  Future Appointments  Date Time Provider [place]  11/25/2017  7:45 AM WH-MFC [place] 2 WH-MFCUS MFC-US    Emeterio Reeve, MD

## 2017-11-23 NOTE — Progress Notes (Signed)
Makena application faxed.

## 2017-11-23 NOTE — Patient Instructions (Signed)
Segundo trimestre de Media planner (Second Trimester of Pregnancy) El segundo trimestre va desde la semana13 hasta la 64, desde el cuarto hasta el sexto mes, y suele ser el momento en el que mejor se siente. En general, las nuseas matutinas han disminuido o han desaparecido completamente. Tendr ms energa y podr aumentarle el apetito. El beb por nacer (feto) se desarrolla rpidamente. Hacia el final del sexto mes, el beb mide aproximadamente 9 pulgadas (23 cm) y pesa alrededor de 1 libras (700 g). Es probable que sienta al beb moverse (dar pataditas) entre las 18 y 77 semanas del Media planner. CUIDADOS EN EL HOGAR  No fume, no consuma hierbas ni beba alcohol. No tome frmacos que el mdico no haya autorizado.  No consuma ningn producto que contenga tabaco, lo que incluye cigarrillos, tabaco de Higher education careers adviser o Psychologist, sport and exercise. Si necesita ayuda para dejar de fumar, consulte al [employer]. Puede recibir asesoramiento u otro tipo de apoyo para dejar de fumar.  Tome los medicamentos solamente como se lo haya indicado el mdico. Algunos medicamentos son seguros para tomar durante el Media planner y otros no lo son.  Haga ejercicios solamente como se lo haya indicado el mdico. Interrumpa la actividad fsica si comienza a tener calambres.  Ingiera alimentos saludables de [place] regular.  Use un sostn que le brinde buen soporte si sus mamas estn sensibles.  No se d baos de inmersin en agua caliente, baos turcos ni saunas.  Colquese el cinturn de seguridad cuando conduzca.  No coma carne cruda ni queso sin cocinar; evite el contacto con las bandejas sanitarias de los gatos y la tierra que estos animales usan.  Seama.  Tome entre 1500 y 2082m de calcio diariamente comenzando en la [id number] embarazo hMartins[place]  Pruebe tomar un medicamento que la ayude a defecar (un laxante suave) si el mdico lo autoriza. Consuma ms fibra, que se encuentra en las frutas y  verduras frescas y los cereales integrales. Beba suficiente lquido para mantener el pis (orina) claro o de color amarillo plido.  Dese baos de asiento con agua tibia para aBest boyo las molestias causadas por las hemorroides. Use una crema para las hemorroides si el mdico la autoriza.  Si se le hinchan las venas (venas varicosas), use medias de descanso. Levante (eleve) los pies durante 168mutos, 3 o 4veces por daTraining and development officerLimite el consumo de sal en su dieta.  No levante objetos pesados, use zapatos de tacones bajos y sintese derecha.  Descanse con las piernas elevadas si tiene calambres o dolor de cintura.  Visite a su dentista si no lo ha heQuarry managerUse un cepillo de cerdas suaves para cepillarse los dientes. Psese el hilo dental con suavidad.  Puede seguir maAmerican Electric Powera menos que el mdico le indique lo contrario.  Concurra a los controles mdicos.  SOLICITE AYUDA SI:  Siente mareos.  Sufre calambres o presin leves en la parte baja del vientre (abdomen).  Sufre un dolor persistente en el abdomen.  Tiene maHigher education careers advisernuseas), vmitos, o tiene deposiciones acuosas (diarrea).  Advierte un olor ftido que proviene de la vagina.  Siente dolor al orContinental Airlines SOLICITE AYUDA DE INMEDIATO SI:  Tiene fiebre.  Tiene una prdida de lquido por la vagina.  Tiene sangrado o pequeas prdidas vaginales.  Siente dolor intenso o clicos en el abdomen.  Sube o baja de peso rpidamente.  Tiene dificultades para recuperar el aliento y siente dolor en el pecho.  Sbitamente se  le hinchan mucho el rostro, las manos, los tobillos, los pies o las piernas.  No ha sentido los movimientos del beb durante una hora.  Siente un dolor de cabeza intenso que no se alivia con medicamentos.  Su visin se modifica.  Esta informacin no tiene como fin reemplazar el consejo del mdico. Asegrese de hacerle al mdico cualquier pregunta que  tenga. Document Released: 03/28/2013 Document Revised: 08/16/2014 Document Reviewed: 09/26/2012 Elsevier Interactive Patient Education  2017 Elsevier Inc.  

## 2017-11-24 ENCOUNTER — Encounter: Payer: Self-pay | Admitting: Obstetrics & Gynecology

## 2017-11-25 ENCOUNTER — Ambulatory Visit (HOSPITAL_COMMUNITY)
Admission: RE | Admit: 2017-11-25 | Discharge: 2017-11-25 | Disposition: A | Discharge status: Home / Self Care | Payer: Managed Care, Other (non HMO) | Source: Ambulatory Visit | Attending: Nurse Practitioner | Admitting: Nurse Practitioner

## 2017-11-25 ENCOUNTER — Other Ambulatory Visit (HOSPITAL_COMMUNITY): Payer: Self-pay | Admitting: *Deleted

## 2017-11-25 ENCOUNTER — Other Ambulatory Visit (HOSPITAL_COMMUNITY): Payer: Self-pay | Admitting: Obstetrics and Gynecology

## 2017-11-25 ENCOUNTER — Encounter (HOSPITAL_COMMUNITY): Payer: Self-pay

## 2017-11-25 DIAGNOSIS — Z363 Encounter for antenatal screening for malformations: Secondary | ICD-10-CM | POA: Diagnosis not present

## 2017-11-25 DIAGNOSIS — O09219 Supervision of pregnancy with history of pre-term labor, unspecified trimester: Secondary | ICD-10-CM

## 2017-11-25 DIAGNOSIS — O09899 Supervision of other high risk pregnancies, unspecified trimester: Secondary | ICD-10-CM

## 2017-11-25 DIAGNOSIS — O99282 Endocrine, nutritional and metabolic diseases complicating pregnancy, second trimester: Principal | ICD-10-CM

## 2017-11-25 DIAGNOSIS — O3432 Maternal care for cervical incompetence, second trimester: Secondary | ICD-10-CM | POA: Insufficient documentation

## 2017-11-25 DIAGNOSIS — E039 Hypothyroidism, unspecified: Secondary | ICD-10-CM | POA: Diagnosis not present

## 2017-11-25 DIAGNOSIS — O09212 Supervision of pregnancy with history of pre-term labor, second trimester: Secondary | ICD-10-CM | POA: Insufficient documentation

## 2017-11-25 DIAGNOSIS — Z3A18 18 weeks gestation of pregnancy: Secondary | ICD-10-CM | POA: Diagnosis not present

## 2017-11-30 ENCOUNTER — Ambulatory Visit (INDEPENDENT_AMBULATORY_CARE_PROVIDER_SITE_OTHER): Payer: Managed Care, Other (non HMO) | Admitting: General Practice

## 2017-11-30 DIAGNOSIS — O3432 Maternal care for cervical incompetence, second trimester: Secondary | ICD-10-CM | POA: Diagnosis not present

## 2017-11-30 MED ORDER — HYDROXYPROGESTERONE CAPROATE 275 MG/1.1ML ~~LOC~~ SOAJ
275.0000 mg | SUBCUTANEOUS | Status: DC
  Administered 2017-11-30 – 2017-12-14 (×3): 275 mg via SUBCUTANEOUS

## 2017-11-30 NOTE — Progress Notes (Signed)
Andrea Barry here for 17-P  Injection.  Injection administered without complication. Patient will return in one week for next injection.  Derinda Late, RN 11/30/2017  1:46 PM

## 2017-12-01 NOTE — Progress Notes (Signed)
I have reviewed this chart and agree with the RN/CMA assessment and management.    Zanylah Hardie C Adell Panek, MD, FACOG Attending Physician, [facility]  

## 2017-12-07 ENCOUNTER — Ambulatory Visit (INDEPENDENT_AMBULATORY_CARE_PROVIDER_SITE_OTHER): Payer: Managed Care, Other (non HMO) | Admitting: *Deleted

## 2017-12-07 VITALS — BP 109/61 | HR 79

## 2017-12-07 DIAGNOSIS — O09899 Supervision of other high risk pregnancies, unspecified trimester: Secondary | ICD-10-CM

## 2017-12-07 DIAGNOSIS — O09212 Supervision of pregnancy with history of pre-term labor, second trimester: Secondary | ICD-10-CM

## 2017-12-07 DIAGNOSIS — O0990 Supervision of high risk pregnancy, unspecified, unspecified trimester: Secondary | ICD-10-CM

## 2017-12-07 DIAGNOSIS — O0992 Supervision of high risk pregnancy, unspecified, second trimester: Secondary | ICD-10-CM

## 2017-12-07 DIAGNOSIS — O09219 Supervision of pregnancy with history of pre-term labor, unspecified trimester: Secondary | ICD-10-CM

## 2017-12-07 NOTE — Progress Notes (Signed)
Here for 17p. C/o headaches often. States had headaches before pregnancy.  Is taking extra strength tylenol without relief. Ha=9.  Negative edema, bp wnl today. Discussed with Dr. Ilda Basset - may take over the counter magnesium 600mg - 1 tablet every 4 hours prn.

## 2017-12-07 NOTE — Patient Instructions (Signed)
Magnesium 600mg  every 4 hours as needed for headache

## 2017-12-12 NOTE — Progress Notes (Signed)
I have reviewed the chart and agree with nursing staff's documentation of this patient's encounter.  Aletha Halim, MD 12/12/2017 10:29 AM

## 2017-12-13 ENCOUNTER — Other Ambulatory Visit (HOSPITAL_COMMUNITY): Payer: Self-pay | Admitting: Obstetrics and Gynecology

## 2017-12-13 ENCOUNTER — Encounter: Payer: Self-pay | Admitting: Obstetrics & Gynecology

## 2017-12-13 ENCOUNTER — Encounter (HOSPITAL_COMMUNITY): Payer: Self-pay

## 2017-12-13 ENCOUNTER — Ambulatory Visit (HOSPITAL_COMMUNITY)
Admission: RE | Admit: 2017-12-13 | Discharge: 2017-12-13 | Disposition: A | Discharge status: Home / Self Care | Payer: Managed Care, Other (non HMO) | Source: Ambulatory Visit | Attending: Nurse Practitioner | Admitting: Nurse Practitioner

## 2017-12-13 ENCOUNTER — Other Ambulatory Visit (HOSPITAL_COMMUNITY): Payer: Self-pay | Admitting: *Deleted

## 2017-12-13 DIAGNOSIS — E039 Hypothyroidism, unspecified: Secondary | ICD-10-CM | POA: Diagnosis not present

## 2017-12-13 DIAGNOSIS — Z3A21 21 weeks gestation of pregnancy: Secondary | ICD-10-CM

## 2017-12-13 DIAGNOSIS — O3432 Maternal care for cervical incompetence, second trimester: Secondary | ICD-10-CM | POA: Diagnosis not present

## 2017-12-13 DIAGNOSIS — O09212 Supervision of pregnancy with history of pre-term labor, second trimester: Secondary | ICD-10-CM | POA: Insufficient documentation

## 2017-12-13 DIAGNOSIS — Z3689 Encounter for other specified antenatal screening: Secondary | ICD-10-CM | POA: Insufficient documentation

## 2017-12-13 DIAGNOSIS — O09899 Supervision of other high risk pregnancies, unspecified trimester: Secondary | ICD-10-CM

## 2017-12-13 DIAGNOSIS — O99282 Endocrine, nutritional and metabolic diseases complicating pregnancy, second trimester: Secondary | ICD-10-CM | POA: Diagnosis not present

## 2017-12-13 DIAGNOSIS — O3433 Maternal care for cervical incompetence, third trimester: Secondary | ICD-10-CM

## 2017-12-13 DIAGNOSIS — O26873 Cervical shortening, third trimester: Secondary | ICD-10-CM | POA: Insufficient documentation

## 2017-12-13 DIAGNOSIS — O09219 Supervision of pregnancy with history of pre-term labor, unspecified trimester: Secondary | ICD-10-CM

## 2017-12-14 ENCOUNTER — Encounter: Payer: Self-pay | Admitting: Family Medicine

## 2017-12-14 ENCOUNTER — Ambulatory Visit (INDEPENDENT_AMBULATORY_CARE_PROVIDER_SITE_OTHER): Payer: Managed Care, Other (non HMO) | Admitting: General Practice

## 2017-12-14 ENCOUNTER — Encounter (HOSPITAL_COMMUNITY): Payer: Self-pay | Admitting: Obstetrics & Gynecology

## 2017-12-14 VITALS — BP 133/68 | HR 89 | Ht 64.57 in | Wt 145.0 lb

## 2017-12-14 DIAGNOSIS — O09899 Supervision of other high risk pregnancies, unspecified trimester: Secondary | ICD-10-CM

## 2017-12-14 DIAGNOSIS — O09212 Supervision of pregnancy with history of pre-term labor, second trimester: Secondary | ICD-10-CM | POA: Diagnosis not present

## 2017-12-14 DIAGNOSIS — O26872 Cervical shortening, second trimester: Secondary | ICD-10-CM

## 2017-12-14 DIAGNOSIS — O09219 Supervision of pregnancy with history of pre-term labor, unspecified trimester: Secondary | ICD-10-CM

## 2017-12-14 NOTE — Progress Notes (Signed)
Andrea Barry here for 17-P  Injection.  Injection administered without complication. Patient will return in one week for next injection.  Derinda Late, RN 12/14/2017  2:52 PM

## 2017-12-14 NOTE — H&P (Signed)
Preoperative History and Physical  Andrea Barry is a 32 y.o. [phone] here for surgical management of shortened cervix due to cervical incompetence.  Patient speaks [name] and [place], [place] interpreter present for this encounter. No significant preoperative concerns.  Proposed surgery: Transvaginal cervical cerclage  Past Medical History:  Diagnosis Date  . Allergy   . Anemia   . Cervical incompetence    2nd trimester fetal loss  . Hypothyroidism    Last TSH (04/2017) was normal, off levothyroxine for months   Past Surgical History:  Procedure Laterality Date  . NO PAST SURGERIES     OB History  Gravida Para Term Preterm AB Living  4 1 0 1 2 0  SAB TAB Ectopic Multiple Live Births  2 0 0 0 0    # Outcome Date GA Lbr Len/2nd Weight Sex Delivery Anes PTL Lv  4 Current           3 Preterm 2017 [redacted]w[redacted]d       FD  2 SAB 2015          1 SAB 2014          Patient denies any other pertinent gynecologic issues.   No current facility-administered medications on file prior to encounter.    Current Outpatient Medications on File Prior to Encounter  Medication Sig Dispense Refill  . Ascorbic Acid (VITAMIN C GUMMIE PO) Take 1 each by mouth daily.     . ferrous sulfate 325 (65 FE) MG tablet Take 1 tablet (325 mg total) by mouth daily with breakfast. 30 tablet 3  . loratadine (CLARITIN) 10 MG tablet Take 10 mg by mouth daily as needed for allergies.     Marland Kitchen MAGNESIUM PO Take 1 tablet by mouth daily.     . prenatal vitamin w/FE, FA (PRENATAL 1 + 1) 27-1 MG TABS tablet Take 1 tablet by mouth daily at 12 noon. 30 tablet 6   Allergies  Allergen Reactions  . Metronidazole Other (See Comments)    Dizziness, taste of iron in mouth, and darkened urine    Social History:   reports that she has never smoked. She has never used smokeless tobacco. She reports that she does not drink alcohol or use drugs.  Family History  Problem Relation Age of Onset  . Diabetes Mother   . Hypertension  Mother   . Mental illness Maternal Grandmother   . Hypertension Paternal Grandmother     Review of Systems: Pertinent items noted in HPI and remainder of comprehensive ROS otherwise negative.  PHYSICAL EXAM: Blood pressure 105/64, pulse 84, temperature 98.1 F (36.7 C), temperature source Oral, resp. rate 16, height 5\' 4"  (1.626 m), weight 145 lb (65.8 kg), last menstrual period 07/18/2017, SpO2 100 %.  FHR: 149bpm CONSTITUTIONAL: Well-developed, well-nourished female in no acute distress.  HENT:  Normocephalic, atraumatic, External right and left ear normal. Oropharynx is clear and moist EYES: Conjunctivae and EOM are normal. Pupils are equal, round, and reactive to light. No scleral icterus.  NECK: Normal range of motion, supple, no masses SKIN: Skin is warm and dry. No rash noted. Not diaphoretic. No erythema. No pallor. NEUROLOGIC: Alert and oriented to person, place, and time. Normal reflexes, muscle tone coordination. No cranial nerve deficit noted. PSYCHIATRIC: Normal mood and affect. Normal behavior. Normal judgment and thought content. CARDIOVASCULAR: Normal heart rate noted, regular rhythm RESPIRATORY: Effort and breath sounds normal, no problems with respiration noted ABDOMEN: Soft, gravid, nontender, nondistended. PELVIC: Deferred MUSCULOSKELETAL: Normal range of motion.  No edema and no tenderness. 2+ distal pulses.  Labs: Results for orders placed or performed during the hospital encounter of 12/15/17 (from the past 336 hour(s))  CBC   Collection Time: 12/15/17  8:56 AM  Result Value Ref Range   WBC 7.5 4.0 - 10.5 K/uL   RBC 4.19 3.87 - 5.11 MIL/uL   Hemoglobin 11.1 (L) 12.0 - 15.0 g/dL   HCT 36.3 36.0 - 46.0 %   MCV 86.6 78.0 - 100.0 fL   MCH 26.5 26.0 - 34.0 pg   MCHC 30.6 30.0 - 36.0 g/dL   RDW 21.1 (H) 11.5 - 15.5 %   Platelets 230 150 - 400 K/uL    Imaging Studies: [place] Mfm Ob Transvaginal  Result Date:  12/13/2017 ----------------------------------------------------------------------  OBSTETRICS REPORT                      (Signed Final 12/13/2017 06:34 pm) ---------------------------------------------------------------------- Patient Info  ID #:       [id number]                          D.O.B.:  [birth year] (31 yrs)  Name:       [name]                Visit Date: 12/13/2017 09:49 am ---------------------------------------------------------------------- Performed By  Performed By:     Raquel James         Ref. Address:     [place]                                                             [phone]  Attending:        Renella Cunas MD       Location[facility]         [facility]  Referred By:      Jolaine Click NP ---------------------------------------------------------------------- Orders   #  Description                                 Code   1  [place] MFM OB TRANSVAGINAL                      [phone]  ----------------------------------------------------------------------   #  Ordered By               Order #        Accession #    Episode #   1  JEFFREY Margurite Auerbach           [id number]      [id number]     [id number]  ---------------------------------------------------------------------- Indications   [redacted] weeks gestation of pregnancy                Z3A.21   Poor obstetric history: Previous preterm       O09.219   delivery at 21 weeks, antepartum; 17P   Cervical incompetence, second trimester        O34.32   Hypothyroid                                    O99.280 E03.9  ---------------------------------------------------------------------- OB History  Blood Type:            Height:  5'4"   Weight (lb):  135       BMI:  23.17  Gravidity:    4         Prem:   1         SAB:   2  Living:       0  ---------------------------------------------------------------------- Fetal Evaluation  Num Of Fetuses:     1  Fetal Heart         151  Rate(bpm):  Cardiac Activity:   Observed  Presentation:       Breech  Placenta:           Anterior, above cervical os  Amniotic Fluid  AFI FV:      Subjectively within normal limits                              Largest Pocket(cm)                              5.19 ---------------------------------------------------------------------- Gestational Age  LMP:           21w 1d        Date:  07/18/17                 EDD:   04/24/18  Best:          21w 1d     Det. By:  LMP  (07/18/17)          EDD:   04/24/18 ---------------------------------------------------------------------- Cervix Uterus Adnexa  Cervix  Length:            2.2  cm.  Measured transvaginally.  Uterus  No abnormality visualized.  Left Ovary  Size(cm)       1.6  x   1.6    x  1.7       Vol(ml): 2.3  Within normal limits.  Right Ovary  Not visualized.  Cul De Sac:   No free fluid seen.  Adnexa:       No abnormality visualized. ---------------------------------------------------------------------- Impression  SIUP at 21+1 weeks with cardiac activity  Normal amniotic fluid volume  EV views of cervix: shortened cervical length (2.2 cms) with  minimal funneling; this CL is significantly shorter than the  previous US on 04/19 (3.25 cms)  The US findings were shared with Ms. Moreno. The  implications of a progressively shortening cervical length with  a previous 21 week delivery were discussed in detail. We  reviewed the risks and benefits of expectant management  with the addition of vaginal progesterone and cerclage  placement. After careful consideration, Ms. Gunnar Bulla decided  to pursue cerclage placement.  Case discussed with Dr. Harolyn Rutherford. ---------------------------------------------------------------------- Recommendations  Follow-up ultrasound for cervical length in one week  ----------------------------------------------------------------------                 Renella Cunas, MD Electronically Signed Final Report   12/13/2017 06:34 pm ----------------------------------------------------------------------  [place] Mfm Ob Transvaginal  Result Date: 11/25/2017 ----------------------------------------------------------------------  OBSTETRICS REPORT                        (Corrected Final 11/25/2017 09:07                                                                          am) ---------------------------------------------------------------------- Patient Info  ID #:       [id number]                          D.O.B.:  1986-03-15 (31 yrs)  Name:       [facility]                Visit Date: 11/25/2017 07:30 am ---------------------------------------------------------------------- Performed By  Performed By:     Corky Crafts             Ref. Address:     [place]                                                             [id number]  Attending:        Oralia Rud       Location:         [facility]                    MD  Referred By:      Jolaine Click  NP ---------------------------------------------------------------------- Orders   #  Description                                 Code   1  [place] MFM OB DETAIL +[address] [place]                     76811.01   2  [place] MFM OB TRANSVAGINAL                      69629.5  ----------------------------------------------------------------------   #  Ordered By               Order #        Accession #    Episode #   1  [place]              [id number]      [id number]     [id number]   2  [place]              [id number]      [id number]     [id number]  ---------------------------------------------------------------------- Indications   [redacted] weeks gestation of pregnancy                 Z3A.18   Poor obstetric history: Previous preterm       O09.219   delivery at 21 weeks, antepartum   Cervical incompetence, second trimester        O34.32   (17P)   Chorioamnionitis (with prev pregnancy)         O41.1290   Encounter for antenatal screening for          Z36.3   malformations   Hypothyroid                                    O99.280 E03.9  ---------------------------------------------------------------------- OB History  Blood Type:            Height:  5'4"   Weight (lb):  135       BMI:  23.17  Gravidity:    4         Prem:   1         SAB:   2  Living:       0 ---------------------------------------------------------------------- Fetal Evaluation  Num Of Fetuses:     1  Fetal Heart         142  Rate(bpm):  Cardiac Activity:   Observed  Presentation:       Transverse, head to maternal right  Placenta:           Anterior, above cervical os  P. Cord Insertion:  Visualized  Amniotic Fluid  AFI FV:      Subjectively within normal limits                              Largest Pocket(cm)                              4.06 ---------------------------------------------------------------------- Biometry  BPD:      39.6  mm     G. Age:  18w 0d         27  %    CI:  72.1   %    70 - 86                                                          FL/HC:      17.8   %    16.1 - 18.3  HC:      148.4  mm     G. Age:  18w 0d         17  %    HC/AC:      1.08        1.09 - 1.39  AC:      137.7  mm     G. Age:  19w 1d         1  %    FL/BPD:     66.7   %  FL:       26.4  mm     G. Age:  18w 0d         25  %    FL/AC:      19.2   %    20 - 24  HUM:      25.8  mm     G. Age:  18w 0d         40  %  CER:      18.4  mm     G. Age:  18w 1d         37  %  NFT:       2.7  mm  CM:        4.8  mm  Est. FW:     246  gm      0 lb 9 oz     46  % ---------------------------------------------------------------------- Gestational Age  LMP:           18w 4d        Date:  07/18/17                 EDD:   04/24/18  U/S Today:      18w 2d                                        EDD:   04/26/18  Best:          18w 4d     Det. By:  LMP  (07/18/17)          EDD:   04/24/18 ---------------------------------------------------------------------- Anatomy  Cranium:               Appears normal         Aortic Arch:            Appears normal  Cavum:                 Appears normal         Ductal Arch:            Appears normal  Ventricles:            Appears normal         Diaphragm:              Appears normal  Choroid Plexus:  Appears normal         Stomach:                Appears normal, left                                                                        sided  Cerebellum:            Appears normal         Abdomen:                Appears normal  Posterior Fossa:       Appears normal         Abdominal Wall:         Appears nml (cord                                                                        insert, abd wall)  Nuchal Fold:           Appears normal         Cord Vessels:           Appears normal (3                                                                        vessel cord)  Face:                  Appears normal         Kidneys:                Appear normal                         (orbits and profile)  Lips:                  Appears normal         Bladder:                Appears normal  Thoracic:              Appears normal         Spine:                  Not well visualized  Heart:                 Appears normal         Upper Extremities:      Appears normal;                         (4CH, axis, and situs  5th digit nwv  RVOT:                  Appears normal         Lower Extremities:      Appears normal  LVOT:                  Appears normal  Other:  Fetus appears to be a female. Heels visualized. Nasal bone          visualized. Technically difficult due to fetal position. ---------------------------------------------------------------------- Cervix Uterus Adnexa  Cervix  Length:           3.25  cm.   Normal appearance by transabdominal scan.  Uterus  No abnormality visualized.  Left Ovary  Size(cm)       2.4  x   1.2    x  1.3       Vol(ml): 2  Within normal limits.  Right Ovary  Size(cm)       2.3  x   1.7    x  1.9       Vol(ml): 3.9  Within normal limits. ---------------------------------------------------------------------- Impression  Single living intrauterine pregnancy at 18w 4d.  Placenta Anterior, above cervical os.  Appropriate fetal growth.  Normal amniotic fluid volume.  The fetal anatomic survey is not complete.  No gross fetal anomalies identified.  The cervix measures 3.25cm transabdominally without  funneling.  The adnexa appear normal bilaterally without masses.  Low risk NIPS ---------------------------------------------------------------------- Recommendations  Given hx of prior second trimester spontaneous preterm  delivery, recomend cervical length q2 weeks through 26  weeks.  Attempt to complete survey along with endovaginal  ultrasound in 2 weeks.  Given hypothyroidism, recommend interval growth every 6  weeks. ----------------------------------------------------------------------                    Oralia Rud, MD Electronically Signed Corrected Final Report  11/25/2017 09:07 am ----------------------------------------------------------------------  [place] Mfm Ob Detail +14 [place]  Result Date: 11/25/2017 ----------------------------------------------------------------------  OBSTETRICS REPORT                        (Corrected Final 11/25/2017 09:07                                                                          am) ---------------------------------------------------------------------- Patient Info  ID #:       [id number]                          D.O.B.:  [birth year] (31 yrs)  Name:       [facility]                Visit Date: 11/25/2017 07:30 am ---------------------------------------------------------------------- Performed By  Performed By:     Corky Crafts             Ref.  Address:     [place]  [address]                                                             [address]  Attending:        Oralia Rud       Location:         [facility]                    MD  Referred By:      Jolaine Click NP ---------------------------------------------------------------------- Orders   #  Description                                 Code   1  [place] MFM OB DETAIL +14 [place]                     76811.01   2  [place] MFM OB TRANSVAGINAL                      08657.8  ----------------------------------------------------------------------   #  Ordered By               Order #        Accession #    Episode #   1  [place]              [id number]      [id number]     [id number]   2  [place]              [id number]      [id number]     [id number]  ---------------------------------------------------------------------- Indications   [redacted] weeks gestation of pregnancy                Z3A.18   Poor obstetric history: Previous preterm       O09.219   delivery at 21 weeks, antepartum   Cervical incompetence, second trimester        O34.32   (17P)   Chorioamnionitis (with prev pregnancy)         O41.1290   Encounter for antenatal screening for          Z36.3   malformations   Hypothyroid                                    O99.280 E03.9  ---------------------------------------------------------------------- OB History  Blood Type:            Height:  5'4"   Weight (lb):  135       BMI:  23.17  Gravidity:    4         Prem:   1  SAB:   2  Living:       0 ---------------------------------------------------------------------- Fetal Evaluation  Num Of Fetuses:     1  Fetal Heart         142  Rate(bpm):  Cardiac Activity:   Observed  Presentation:       Transverse, head to maternal right  Placenta:           Anterior, above cervical os  P.  Cord Insertion:  Visualized  Amniotic Fluid  AFI FV:      Subjectively within normal limits                              Largest Pocket(cm)                              4.06 ---------------------------------------------------------------------- Biometry  BPD:      39.6  mm     G. Age:  18w 0d         27  %    CI:         72.1   %    70 - 86                                                          FL/HC:      17.8   %    16.1 - 18.3  HC:      148.4  mm     G. Age:  18w 0d         17  %    HC/AC:      1.08        1.09 - 1.39  AC:      137.7  mm     G. Age:  19w 1d         15  %    FL/BPD:     66.7   %  FL:       26.4  mm     G. Age:  18w 0d         25  %    FL/AC:      19.2   %    20 - 24  HUM:      25.8  mm     G. Age:  18w 0d         40  %  CER:      18.4  mm     G. Age:  18w 1d         37  %  NFT:       2.7  mm  CM:        4.8  mm  Est. FW:     246  gm      0 lb 9 oz     46  % ---------------------------------------------------------------------- Gestational Age  LMP:           18w 4d        Date:  07/18/17                 EDD:   04/24/18  U/S Today:     18w 2d  EDD:   04/26/18  Best:          18w 4d     Det. By:  LMP  (07/18/17)          EDD:   04/24/18 ---------------------------------------------------------------------- Anatomy  Cranium:               Appears normal         Aortic Arch:            Appears normal  Cavum:                 Appears normal         Ductal Arch:            Appears normal  Ventricles:            Appears normal         Diaphragm:              Appears normal  Choroid Plexus:        Appears normal         Stomach:                Appears normal, left                                                                        sided  Cerebellum:            Appears normal         Abdomen:                Appears normal  Posterior Fossa:       Appears normal         Abdominal Wall:         Appears nml (cord                                                                         insert, abd wall)  Nuchal Fold:           Appears normal         Cord Vessels:           Appears normal (3                                                                        vessel cord)  Face:                  Appears normal         Kidneys:                Appear normal                         (  orbits and profile)  Lips:                  Appears normal         Bladder:                Appears normal  Thoracic:              Appears normal         Spine:                  Not well visualized  Heart:                 Appears normal         Upper Extremities:      Appears normal;                         (4CH, axis, and situs                          5th digit nwv  RVOT:                  Appears normal         Lower Extremities:      Appears normal  LVOT:                  Appears normal  Other:  Fetus appears to be a female. Heels visualized. Nasal bone          visualized. Technically difficult due to fetal position. ---------------------------------------------------------------------- Cervix Uterus Adnexa  Cervix  Length:           3.25  cm.  Normal appearance by transabdominal scan.  Uterus  No abnormality visualized.  Left Ovary  Size(cm)       2.4  x   1.2    x  1.3       Vol(ml): 2  Within normal limits.  Right Ovary  Size(cm)       2.3  x   1.7    x  1.9       Vol(ml): 3.9  Within normal limits. ---------------------------------------------------------------------- Impression  Single living intrauterine pregnancy at 18w 4d.  Placenta Anterior, above cervical os.  Appropriate fetal growth.  Normal amniotic fluid volume.  The fetal anatomic survey is not complete.  No gross fetal anomalies identified.  The cervix measures 3.25cm transabdominally without  funneling.  The adnexa appear normal bilaterally without masses.  Low risk NIPS ---------------------------------------------------------------------- Recommendations  Given hx of prior second trimester spontaneous preterm  delivery, recomend  cervical length q2 weeks through 26  weeks.  Attempt to complete survey along with endovaginal  ultrasound in 2 weeks.  Given hypothyroidism, recommend interval growth every 6  weeks. ----------------------------------------------------------------------                    Oralia Rud, MD Electronically Signed Corrected Final Report  11/25/2017 09:07 am ----------------------------------------------------------------------   Assessment: Principal Problem:   Short cervix during pregnancy in second trimester Active Problems:   Supervision of high risk pregnancy, antepartum   Cervical incompetence in pregnancy, second trimester   History of preterm delivery at 21 weeks, currently pregnant   Plan: Patient will undergo surgical management with transvaginal cervical cerclage.   The risks of surgery were discussed in detail with the patient including but not limited to: bleeding; infection which may require antibiotic therapy;  injury to cervix, vagina other surrounding organs; risk of ruptured membranes and/or preterm delivery and other postoperative or anesthesia complications. Likelihood of success in alleviating the patient's condition was discussed. Routine postoperative instructions will be reviewed with the patient and her family in detail after surgery.  The patient concurred with the proposed plan, giving informed written consent for the surgery.  Patient has been NPO since last night and she will remain NPO for procedure.  Anesthesia and OR aware.  Preoperative prophylactic antibiotics and SCDs ordered on call to the OR.  To OR when ready.    Verita Schneiders, MD, [place] [facility] [employer], [place]

## 2017-12-15 ENCOUNTER — Encounter (HOSPITAL_COMMUNITY): Payer: Self-pay | Admitting: Emergency Medicine

## 2017-12-15 ENCOUNTER — Ambulatory Visit (HOSPITAL_COMMUNITY): Payer: Managed Care, Other (non HMO) | Admitting: Certified Registered Nurse Anesthetist

## 2017-12-15 ENCOUNTER — Ambulatory Visit (HOSPITAL_COMMUNITY)
Admission: RE | Admit: 2017-12-15 | Discharge: 2017-12-15 | Disposition: A | Discharge status: Home / Self Care | Payer: Managed Care, Other (non HMO) | Source: Ambulatory Visit | Attending: Obstetrics & Gynecology | Admitting: Obstetrics & Gynecology

## 2017-12-15 ENCOUNTER — Other Ambulatory Visit: Payer: Self-pay

## 2017-12-15 ENCOUNTER — Encounter (HOSPITAL_COMMUNITY)
Admission: RE | Disposition: A | Discharge status: Home / Self Care | Payer: Self-pay | Source: Ambulatory Visit | Attending: Obstetrics & Gynecology

## 2017-12-15 DIAGNOSIS — O3432 Maternal care for cervical incompetence, second trimester: Secondary | ICD-10-CM | POA: Diagnosis not present

## 2017-12-15 DIAGNOSIS — Z3A21 21 weeks gestation of pregnancy: Secondary | ICD-10-CM | POA: Insufficient documentation

## 2017-12-15 DIAGNOSIS — Z3490 Encounter for supervision of normal pregnancy, unspecified, unspecified trimester: Secondary | ICD-10-CM

## 2017-12-15 DIAGNOSIS — O09899 Supervision of other high risk pregnancies, unspecified trimester: Secondary | ICD-10-CM

## 2017-12-15 DIAGNOSIS — O3433 Maternal care for cervical incompetence, third trimester: Secondary | ICD-10-CM | POA: Diagnosis present

## 2017-12-15 DIAGNOSIS — O26873 Cervical shortening, third trimester: Secondary | ICD-10-CM | POA: Diagnosis present

## 2017-12-15 DIAGNOSIS — O26872 Cervical shortening, second trimester: Secondary | ICD-10-CM | POA: Insufficient documentation

## 2017-12-15 DIAGNOSIS — O09219 Supervision of pregnancy with history of pre-term labor, unspecified trimester: Secondary | ICD-10-CM

## 2017-12-15 HISTORY — PX: CERVICAL CERCLAGE: SHX1329

## 2017-12-15 HISTORY — DX: Incompetence of cervix uteri: N88.3

## 2017-12-15 LAB — CBC
HEMATOCRIT: 36.3 % (ref 36.0–46.0)
Hemoglobin: 11.1 g/dL — ABNORMAL LOW (ref 12.0–15.0)
MCH: 26.5 pg (ref 26.0–34.0)
MCHC: 30.6 g/dL (ref 30.0–36.0)
MCV: 86.6 fL (ref 78.0–100.0)
PLATELETS: 230 10*3/uL (ref 150–400)
RBC: 4.19 MIL/uL (ref 3.87–5.11)
RDW: 21.1 % — AB (ref 11.5–15.5)
WBC: 7.5 10*3/uL (ref 4.0–10.5)

## 2017-12-15 SURGERY — CERCLAGE, CERVIX, VAGINAL APPROACH
Anesthesia: Spinal | Site: Vagina

## 2017-12-15 MED ORDER — SOD CITRATE-CITRIC ACID 500-334 MG/5ML PO SOLN
ORAL | Status: AC
  Administered 2017-12-15: 30 mL via ORAL
  Filled 2017-12-15: qty 15

## 2017-12-15 MED ORDER — DOCUSATE SODIUM 100 MG PO CAPS: 100.0000 mg | ORAL_CAPSULE | Freq: Two times a day (BID) | ORAL | 2 refills | Status: DC | PRN

## 2017-12-15 MED ORDER — CEFOTETAN DISODIUM-DEXTROSE 2-2.08 GM-%(50ML) IV SOLR
2.0000 g | INTRAVENOUS | Status: AC
  Administered 2017-12-15: 2 g via INTRAVENOUS

## 2017-12-15 MED ORDER — PROGESTERONE MICRONIZED 200 MG PO CAPS: ORAL_CAPSULE | ORAL | 3 refills | Status: DC

## 2017-12-15 MED ORDER — LACTATED RINGERS IV SOLN
INTRAVENOUS | Status: DC
  Administered 2017-12-15 (×2): via INTRAVENOUS

## 2017-12-15 MED ORDER — BUPIVACAINE IN DEXTROSE 0.75-8.25 % IT SOLN
INTRATHECAL | Status: DC | PRN
  Administered 2017-12-15: 1.4 mL via INTRATHECAL

## 2017-12-15 MED ORDER — SOD CITRATE-CITRIC ACID 500-334 MG/5ML PO SOLN
30.0000 mL | ORAL | Status: AC
  Administered 2017-12-15: 30 mL via ORAL

## 2017-12-15 MED ORDER — INDOMETHACIN 50 MG RE SUPP
100.0000 mg | Freq: Once | RECTAL | Status: DC
  Filled 2017-12-15: qty 2

## 2017-12-15 MED ORDER — INDOMETHACIN 50 MG RE SUPP
RECTAL | Status: DC | PRN
  Administered 2017-12-15: 100 mg via RECTAL

## 2017-12-15 MED ORDER — ONDANSETRON HCL 4 MG/2ML IJ SOLN
INTRAMUSCULAR | Status: AC
  Filled 2017-12-15: qty 2

## 2017-12-15 MED ORDER — PHENYLEPHRINE HCL 10 MG/ML IJ SOLN
INTRAMUSCULAR | Status: DC | PRN
  Administered 2017-12-15: 80 ug via INTRAVENOUS
  Administered 2017-12-15: 40 ug via INTRAVENOUS

## 2017-12-15 MED ORDER — TRAMADOL HCL 50 MG PO TABS: 50.0000 mg | ORAL_TABLET | Freq: Four times a day (QID) | ORAL | 0 refills | Status: DC | PRN

## 2017-12-15 MED ORDER — PHENYLEPHRINE 40 MCG/ML (10ML) SYRINGE FOR IV PUSH (FOR BLOOD PRESSURE SUPPORT)
PREFILLED_SYRINGE | INTRAVENOUS | Status: AC
  Filled 2017-12-15: qty 10

## 2017-12-15 MED ORDER — CEFOTETAN DISODIUM-DEXTROSE 2-2.08 GM-%(50ML) IV SOLR
INTRAVENOUS | Status: AC
  Filled 2017-12-15: qty 50

## 2017-12-15 MED ORDER — ONDANSETRON HCL 4 MG/2ML IJ SOLN
INTRAMUSCULAR | Status: DC | PRN
  Administered 2017-12-15: 4 mg via INTRAVENOUS

## 2017-12-15 SURGICAL SUPPLY — 18 items
CANISTER SUCT 3000ML PPV (MISCELLANEOUS) ×3 IMPLANT
ELECT REM PT RETURN 9FT ADLT (ELECTROSURGICAL)
ELECTRODE REM PT RTRN 9FT ADLT (ELECTROSURGICAL) IMPLANT
GLOVE BIOGEL PI IND STRL 7.0 (GLOVE) ×3 IMPLANT
GLOVE BIOGEL PI INDICATOR 7.0 (GLOVE) ×6
GLOVE ECLIPSE 7.0 STRL STRAW (GLOVE) ×3 IMPLANT
GOWN STRL REUS W/TWL LRG LVL3 (GOWN DISPOSABLE) ×6 IMPLANT
PACK VAGINAL MINOR WOMEN LF (CUSTOM PROCEDURE TRAY) ×3 IMPLANT
PAD OB MATERNITY 4.3X12.25 (PERSONAL CARE ITEMS) ×3 IMPLANT
PAD PREP 24X48 CUFFED NSTRL (MISCELLANEOUS) ×3 IMPLANT
PENCIL BUTTON HOLSTER BLD 10FT (ELECTRODE) IMPLANT
SUT PROLENE 1 CT 1 30 (SUTURE) ×3 IMPLANT
SYR BULB IRRIGATION 50ML (SYRINGE) IMPLANT
TOWEL OR 17X24 6PK STRL BLUE (TOWEL DISPOSABLE) ×6 IMPLANT
TRAY FOLEY W/BAG SLVR 14FR (SET/KITS/TRAYS/PACK) ×3 IMPLANT
TUBING NON-CON 1/4 X 20 CONN (TUBING) IMPLANT
TUBING NON-CON 1/4 X 20' CONN (TUBING)
YANKAUER SUCT BULB TIP NO VENT (SUCTIONS) IMPLANT

## 2017-12-15 NOTE — Anesthesia Procedure Notes (Signed)
Spinal  Patient location during procedure: OR Start time: 12/15/2017 10:08 AM End time: 12/15/2017 10:11 AM Staffing Anesthesiologist: Catalina Gravel, MD Performed: anesthesiologist  Preanesthetic Checklist Completed: patient identified, surgical consent, pre-op evaluation, timeout performed, IV checked, risks and benefits discussed and monitors and equipment checked Spinal Block Patient position: sitting Prep: site prepped and draped and DuraPrep Patient monitoring: continuous pulse ox and blood pressure Approach: midline Location: L3-4 Injection technique: single-shot Needle Needle type: Pencan  Needle gauge: 24 G Assessment Sensory level: T8 Additional Notes Functioning IV was confirmed and monitors were applied. Sterile prep and drape, including hand hygiene, mask and sterile gloves were used. The patient was positioned and the spine was prepped. The skin was anesthetized with lidocaine.  Free flow of clear CSF was obtained prior to injecting local anesthetic into the CSF.  The spinal needle aspirated freely following injection.  The needle was carefully withdrawn.  The patient tolerated the procedure well. Consent was obtained prior to procedure with all questions answered and concerns addressed. Risks including but not limited to bleeding, infection, nerve damage, paralysis, failed block, inadequate analgesia, allergic reaction, high spinal, itching and headache were discussed and the patient wished to proceed.   Hoy Morn, MD

## 2017-12-15 NOTE — Progress Notes (Signed)
I have reviewed the chart and agree with nursing staff's documentation of this patient's encounter.  Mora Bellman, MD 12/15/2017 6:37 AM

## 2017-12-15 NOTE — Anesthesia Preprocedure Evaluation (Signed)
Anesthesia Evaluation  Patient identified by MRN, date of birth, ID band Patient awake    Reviewed: Allergy & Precautions, NPO status , Patient's Chart, lab work & pertinent test results  Airway Mallampati: II  TM Distance: >3 FB Neck ROM: Full    Dental  (+) Teeth Intact, Dental Advisory Given   Pulmonary neg pulmonary ROS,    Pulmonary exam normal breath sounds clear to auscultation       Cardiovascular Exercise Tolerance: Good negative cardio ROS Normal cardiovascular exam Rhythm:Regular Rate:Normal     Neuro/Psych negative neurological ROS  negative psych ROS   GI/Hepatic negative GI ROS, Neg liver ROS,   Endo/Other  Hypothyroidism   Renal/GU negative Renal ROS     Musculoskeletal negative musculoskeletal ROS (+)   Abdominal   Peds  Hematology  (+) Blood dyscrasia, anemia , Plt 230k    Anesthesia Other Findings Day of surgery medications reviewed with the patient.  Reproductive/Obstetrics (+) Pregnancy Short cervix                              Anesthesia Physical Anesthesia Plan  ASA: II  Anesthesia Plan: Spinal   Post-op Pain Management:    Induction:   PONV Risk Score and Plan: 2 and Treatment may vary due to age or medical condition  Airway Management Planned:   Additional Equipment:   Intra-op Plan:   Post-operative Plan:   Informed Consent: I have reviewed the patients History and Physical, chart, labs and discussed the procedure including the risks, benefits and alternatives for the proposed anesthesia with the patient or authorized representative who has indicated his/her understanding and acceptance.   Dental advisory given  Plan Discussed with: CRNA, Anesthesiologist and Surgeon  Anesthesia Plan Comments: (Discussed risks and benefits of and differences between spinal and general. Discussed risks of spinal including headache, backache, failure, bleeding,  infection, and nerve damage. Patient consents to spinal. Questions answered. Coagulation studies and platelet count acceptable.)        Anesthesia Quick Evaluation

## 2017-12-15 NOTE — Op Note (Signed)
Andrea Barry    PROCEDURE DATE: 12/15/2017  PREOPERATIVE DIAGNOSES: Intrauterine pregnancy at [redacted]w[redacted]d, shortened cervix, history of cervical incompetence   POSTOPERATIVE DIAGNOSES: The same PROCEDURE: Transvaginal [place] Cervical Cerclage Placement SURGEON:  Dr. Verita Schneiders  INDICATIONS: 32 y.o. [id number] at [redacted]w[redacted]d with history of cervical incompetence, here for cerclage placement.   The risks of surgery were discussed in detail with the patient including but not limited to: bleeding; infection which may require antibiotic therapy; injury to cervix, vagina other surrounding organs; risk of ruptured membranes and/or preterm delivery and other postoperative or anesthesia complications.  Written informed consent was obtained.    FINDINGS:  About 2 cm palpable cervical length in the vagina, closed cervix, suture knot placed anteriorly.  ANESTHESIA:  Spinal ESTIMATED BLOOD LOSS: 10 ml COMPLICATIONS: None immediate  PROCEDURE IN DETAIL:  The patient received intravenous antibiotics and had sequential compression devices applied to her lower extremities while in the preoperative area.  Reassuring fetal heart rate was also obtained using a doppler. She was then taken to the operating room where spinal anesthesia was administered and was found to be adequate.  She was placed in the dorsal lithotomy, and was prepped and draped in a sterile manner. Her bladder was catheterized for an unmeasured amount of clear, yellow urine.  After an adequate timeout was performed, a vaginal speculum was then placed in the patient's vagina.  The anterior and posterior lips of the cervix were grasped with ring forceps. A curved needle with a 1-0 Prolene suture was inserted at 12 o'clock, as high as possible at the junction of the rugated vaginal epithelium and the smooth cervix, at least 2 cm above the external os.  Four bites are taken circumferentially around the entire cervix in a purse-string fashion, each bite should be  deep enough to extend at least midway into the cervical stroma, but not into the endocervical canal. The two ends of the suture were then tied securely anteriorly and cut, leaving the ends long enough to grasp with a clamp when it is time to remove it. There was minimal bleeding noted and the ring forceps were removed with good hemostasis noted.  No immediate complications noted.  All instruments were removed from the patient's vagina.  Indomethacin 100 mg rectal suppository was placed.  Instrument, needle and sponge counts were correct x 2. The patient tolerated the procedure well, and was taken to the recovery area awake and in stable condition.  Reassuring fetal heart rate was also obtained using a doppler in the recovery area.  The patient will be discharged to home as per PACU criteria.  Routine postoperative instructions given.  She was prescribed vaginal Prometrium, Toradol and Colace.  She will follow up in the clinic on 12/23/17 for postoperative evaluation and ongoing prenatal care.   Verita Schneiders, MD, [place] for [employer], [employer]

## 2017-12-15 NOTE — Transfer of Care (Signed)
Immediate Anesthesia Transfer of Care Note  Patient: Andrea Barry  Procedure(s) Performed: CERCLAGE CERVICAL (N/A Vagina )  Patient Location: PACU  Anesthesia Type:Spinal  Level of Consciousness: awake, alert  and oriented  Airway & Oxygen Therapy: Patient Spontanous Breathing  Post-op Assessment: Report given to RN and Post -op Vital signs reviewed and stable  Post vital signs: Reviewed and stable  Last Vitals:  Vitals Value Taken Time  BP 96/62 12/15/2017 10:45 AM  Temp    Pulse 65 12/15/2017 10:47 AM  Resp 19 12/15/2017 10:47 AM  SpO2 99 % 12/15/2017 10:47 AM  Vitals shown include unvalidated device data.  Last Pain:  Vitals:   12/15/17 0846  TempSrc: Oral      Patients Stated Pain Goal: 5 (50/38/88 2800)  Complications: No apparent anesthesia complications

## 2017-12-15 NOTE — Discharge Instructions (Addendum)
Reposo plvico (Pelvic Rest) CUNDO SE RECOMIENDA EL REPOSO PLVICO? El reposo plvico puede recomendarse en los siguientes casos:  La placenta cubre de forma parcial o total la abertura del cuello del tero (placenta previa).  Hay sangrado entre la pared del tero y el saco amnitico en el primer trimestre de Media planner (hemorragia subcorinica).  El [place] de parto comienza muy pronto (trabajo de parto prematuro). Segn la salud general de la madre y el feto, el mdico decidir si el reposo plvico es [place]. [place]? Durante el tiempo que le indique el mdico:  No tenga relaciones sexuales, estimulacin sexual ni orgasmos.  No use tampones. No se haga duchas vaginales. No se introduzca nada en la vagina.  No levante ningn objeto que pese ms de 10libras (4,5kg).  Evite las actividades que demanden mucho esfuerzo (extenuantes).  Evite las actividades que requieran esfuerzos de los msculos de la pelvis. [place]? Solicite atencin mdica de inmediato si:  Tiene clicos en la zona inferior del abdomen.  Tiene secrecin de flujo vaginal.  Tiene un dolor sordo en la parte baja de la espalda.  Tiene contracciones regulares.  Tienen tensin uterina. [place]? Solicite atencin mdica de inmediato si:  Tiene sangrado vaginal y est embarazada. Esta informacin no tiene Marine scientist el consejo del mdico. Asegrese de hacerle al mdico cualquier pregunta que tenga. Document Released: 04/19/2012 Document Revised: 11/17/2015 Document Reviewed: 01/27/2015 Elsevier Interactive Patient Education  2018 [employer] - Preparing for Surgery   Reposo plvico (Pelvic Rest) CUNDO SE RECOMIENDA EL REPOSO PLVICO? El reposo plvico puede recomendarse en los siguientes casos:  La placenta cubre de forma parcial o total la abertura del cuello del tero (placenta  previa).  Hay sangrado entre la pared del tero y el saco amnitico en el primer trimestre de Media planner (hemorragia subcorinica).  El [place] de parto comienza muy pronto (trabajo de parto prematuro). Segn la salud general de la madre y el feto, el mdico decidir si el reposo plvico es [place]. [place]? Durante el tiempo que le indique el mdico:  No tenga relaciones sexuales, estimulacin sexual ni orgasmos.  No use tampones. No se haga duchas vaginales. No se introduzca nada en la vagina.  No levante ningn objeto que pese ms de 10libras (4,5kg).  Evite las actividades que demanden mucho esfuerzo (extenuantes).  Evite las actividades que requieran esfuerzos de los msculos de la pelvis. [place]? Solicite atencin mdica de inmediato si:  Tiene clicos en la zona inferior del abdomen.  Tiene secrecin de flujo vaginal.  Tiene un dolor sordo en la parte baja de la espalda.  Tiene contracciones regulares.  Tienen tensin uterina. [place]? Solicite atencin mdica de inmediato si:  Tiene sangrado vaginal y est embarazada. Esta informacin no tiene Marine scientist el consejo del mdico. Asegrese de hacerle al mdico cualquier pregunta que tenga. Document Released: 04/19/2012 Document Revised: 11/17/2015 Document Reviewed: 01/27/2015 Elsevier Interactive Patient Education  [employer].  Before surgery, you can play an important role.  Because skin is not sterile, your skin needs to be as free of germs as possible.  You can reduce the number of germs on you skin by washing with CHG (chlorahexidine gluconate) soap before surgery.  CHG is an antiseptic cleaner which kills germs and bonds with the skin to continue killing germs even after washing.  Please DO NOT use  if you have an allergy to CHG or antibacterial soaps.  If your skin becomes reddened/irritated stop using the CHG  and inform your nurse when you arrive at Short Stay.  Do not shave (including legs and underarms) for at least 48 hours prior to the first CHG shower.  You may shave your face.  Please follow these instructions carefully:   1.  Shower with CHG Soap the night before surgery and the                                morning of Surgery.  2.  If you choose to wash your hair, wash your hair first as usual with your       normal shampoo.  3.  After you shampoo, rinse your hair and body thoroughly to remove the                      Shampoo.  4.  Use CHG as you would any other liquid soap.  You can apply chg directly       to the skin and wash gently with scrungie or a clean washcloth.  5.  Apply the CHG Soap to your body ONLY FROM THE NECK DOWN.        Do not use on open wounds or open sores.  Avoid contact with your eyes,       ears, mouth and genitals (private parts).  Wash genitals (private parts)       with your normal soap.  6.  Wash thoroughly, paying special attention to the area where your surgery        will be performed.  7.  Thoroughly rinse your body with warm water from the neck down.  8.  DO NOT shower/wash with your normal soap after using and rinsing off       the CHG Soap.  9.  Pat yourself dry with a clean towel.            10.  Wear clean pajamas.            11.  Place clean sheets on your bed the night of your first shower and do not        sleep with pets.  Day of Surgery  Do not apply any lotions/deoderants the morning of surgery.  Please wear clean clothes to the hospital/surgery center.  Cerclaje cervical, cuidados posteriores (Cervical Cerclage, Care After) Lea esta informacin sobre cmo cuidarse despus del procedimiento. El mdico tambin podr darle instrucciones ms especficas. Comunquese con el mdico si tiene problemas o preguntas. QU ESPERAR DESPUS DEL PROCEDIMIENTO Despus del procedimiento, es comn tener los siguientes sntomas:  [place] en el  abdomen.  [place].  Dolor al Garment/textile technologist (disuria).  Gotas de sangre por la vagina (goteo). [place] indicaciones del mdico acerca del reposo en cama, si corresponde. Es posible que Surveyor, quantity reposo en cama por [employer]  Tome los medicamentos de venta libre y los recetados solamente como se lo haya indicado el mdico.  No conduzca ni use maquinaria pesada mientras toma analgsicos recetados.  Controle el flujo vaginal y verifique si hay algn cambio. Si los hay, informe a su [place] actividades y los ejercicios fsicos hasta que el mdico lo autorice. Pregntele al mdico qu actividades son seguras para usted.  Hasta que el mdico la autorice: ? No se haga duchas vaginales. ? No tenga relaciones sexuales.  Concurra a todas las visitas de control prenatal y a todas las visitas de seguimiento como se lo haya indicado el mdico. Esto es importante. Probablemente deba realizarse un estudio del cuello del tero todas las [place], adems de [place]. SOLICITE ATENCIN MDICA SI:  Tiene flujo vaginal anormal o con mal olor, por ejemplo, cogulos.  Tiene una erupcin cutnea. Esto puede ser enrojecimiento o hinchazn.  Se siente mareada o como si se fuera a desmayar.  El dolor abdominal no mejora con medicamentos.  Tiene nuseas o vmitos persistentes. SOLICITE ATENCIN MDICA DE INMEDIATO SI:  Tiene hemorragia vaginal ms abundante o ms frecuente que un goteo.  Tiene una prdida importante o Optometrist lquido a borbotones por la vagina (rompe aguas).  Tiene fiebre o siente escalofros.  Se desmaya.  Siente contracciones en el tero. Estas pueden sentirse como lo siguiente: ? Dolor de espalda. ? Dolor en la zona inferior del abdomen. ? [place] leves, similares a los [employer]. ? Tirantez o presin en el abdomen.  Siente que el beb no se mueve tanto como antes o no  puede sentir los movimientos del beb.  Siente dolor en el pecho.  Le falta el aire. Esta informacin no tiene Marine scientist el consejo del mdico. Asegrese de hacerle al mdico cualquier pregunta que tenga. Document Released: 05/16/2013 Document Revised: 11/17/2015 Document Reviewed: 02/27/2016 Elsevier Interactive Patient Education  [employer].

## 2017-12-15 NOTE — Anesthesia Postprocedure Evaluation (Signed)
Anesthesia Post Note  Patient: Andrea Barry  Procedure(s) Performed: CERCLAGE CERVICAL (N/A Vagina )     Patient location during evaluation: PACU Anesthesia Type: Spinal Level of consciousness: oriented and awake and alert Pain management: pain level controlled Vital Signs Assessment: post-procedure vital signs reviewed and stable Respiratory status: spontaneous breathing, respiratory function stable and patient connected to nasal cannula oxygen Cardiovascular status: blood pressure returned to baseline and stable Postop Assessment: no headache, no backache, no apparent nausea or vomiting, spinal receding and patient able to bend at knees Anesthetic complications: no    Last Vitals:  Vitals:   12/15/17 1330 12/15/17 1405  BP: 105/67 (!) 95/55  Pulse: 75 62  Resp: 16 18  Temp:  36.8 C  SpO2: 99% 99%    Last Pain:  Vitals:   12/15/17 1300  TempSrc:   PainSc: 0-No pain   Pain Goal: Patients Stated Pain Goal: 5 (12/15/17 0846)               Catalina Gravel

## 2017-12-16 ENCOUNTER — Encounter (HOSPITAL_COMMUNITY): Payer: Self-pay | Admitting: Obstetrics & Gynecology

## 2017-12-20 ENCOUNTER — Other Ambulatory Visit (HOSPITAL_COMMUNITY): Payer: Self-pay | Admitting: Maternal and Fetal Medicine

## 2017-12-20 ENCOUNTER — Encounter (HOSPITAL_COMMUNITY): Payer: Self-pay

## 2017-12-20 ENCOUNTER — Ambulatory Visit (HOSPITAL_COMMUNITY)
Admission: RE | Admit: 2017-12-20 | Discharge: 2017-12-20 | Disposition: A | Discharge status: Home / Self Care | Payer: Managed Care, Other (non HMO) | Source: Ambulatory Visit | Attending: Nurse Practitioner | Admitting: Nurse Practitioner

## 2017-12-20 DIAGNOSIS — O3432 Maternal care for cervical incompetence, second trimester: Secondary | ICD-10-CM

## 2017-12-20 DIAGNOSIS — Z3A22 22 weeks gestation of pregnancy: Secondary | ICD-10-CM | POA: Insufficient documentation

## 2017-12-20 DIAGNOSIS — E039 Hypothyroidism, unspecified: Secondary | ICD-10-CM

## 2017-12-20 DIAGNOSIS — O0990 Supervision of high risk pregnancy, unspecified, unspecified trimester: Secondary | ICD-10-CM

## 2017-12-20 DIAGNOSIS — O09212 Supervision of pregnancy with history of pre-term labor, second trimester: Secondary | ICD-10-CM | POA: Insufficient documentation

## 2017-12-20 DIAGNOSIS — O09219 Supervision of pregnancy with history of pre-term labor, unspecified trimester: Secondary | ICD-10-CM

## 2017-12-20 DIAGNOSIS — O09899 Supervision of other high risk pregnancies, unspecified trimester: Secondary | ICD-10-CM

## 2017-12-23 ENCOUNTER — Encounter: Payer: Self-pay | Admitting: Family Medicine

## 2017-12-23 ENCOUNTER — Ambulatory Visit (INDEPENDENT_AMBULATORY_CARE_PROVIDER_SITE_OTHER): Payer: Managed Care, Other (non HMO) | Admitting: Family Medicine

## 2017-12-23 VITALS — BP 104/64 | HR 77 | Wt 147.2 lb

## 2017-12-23 DIAGNOSIS — O26872 Cervical shortening, second trimester: Secondary | ICD-10-CM

## 2017-12-23 DIAGNOSIS — O0990 Supervision of high risk pregnancy, unspecified, unspecified trimester: Secondary | ICD-10-CM

## 2017-12-23 DIAGNOSIS — O09219 Supervision of pregnancy with history of pre-term labor, unspecified trimester: Principal | ICD-10-CM

## 2017-12-23 DIAGNOSIS — O09899 Supervision of other high risk pregnancies, unspecified trimester: Secondary | ICD-10-CM

## 2017-12-23 DIAGNOSIS — O09212 Supervision of pregnancy with history of pre-term labor, second trimester: Secondary | ICD-10-CM

## 2017-12-23 DIAGNOSIS — O0992 Supervision of high risk pregnancy, unspecified, second trimester: Secondary | ICD-10-CM

## 2017-12-23 MED ORDER — HYDROXYPROGESTERONE CAPROATE 275 MG/1.1ML ~~LOC~~ SOAJ
275.0000 mg | Freq: Once | SUBCUTANEOUS | Status: AC
  Administered 2017-12-23: 275 mg via SUBCUTANEOUS

## 2017-12-23 NOTE — Patient Instructions (Signed)
Second Trimester of Pregnancy The second trimester is from week 13 through week 28, month 4 through 6. This is often the time in pregnancy that you feel your best. Often times, morning sickness has lessened or quit. You may have more energy, and you may get hungry more often. Your unborn baby (fetus) is growing rapidly. At the end of the sixth month, he or she is about 9 inches long and weighs about 1 pounds. You will likely feel the baby move (quickening) between 18 and 20 weeks of pregnancy. Follow these instructions at home:  Avoid all smoking, herbs, and alcohol. Avoid drugs not approved by your doctor.  Do not use any tobacco products, including cigarettes, chewing tobacco, and electronic cigarettes. If you need help quitting, ask your doctor. You may get counseling or other support to help you quit.  Only take medicine as told by your doctor. Some medicines are safe and some are not during pregnancy.  Exercise only as told by your doctor. Stop exercising if you start having cramps.  Eat regular, healthy meals.  Wear a good support bra if your breasts are tender.  Do not use hot tubs, steam rooms, or saunas.  Wear your seat belt when driving.  Avoid raw meat, uncooked cheese, and liter boxes and soil used by cats.  Take your prenatal vitamins.  Take 1500-2000 milligrams of calcium daily starting at the 20th week of pregnancy until you deliver your baby.  Try taking medicine that helps you poop (stool softener) as needed, and if your doctor approves. Eat more fiber by eating fresh fruit, vegetables, and whole grains. Drink enough fluids to keep your pee (urine) clear or pale yellow.  Take warm water baths (sitz baths) to soothe pain or discomfort caused by hemorrhoids. Use hemorrhoid cream if your doctor approves.  If you have puffy, bulging veins (varicose veins), wear support hose. Raise (elevate) your feet for 15 minutes, 3-4 times a day. Limit salt in your diet.  Avoid heavy  lifting, wear low heals, and sit up straight.  Rest with your legs raised if you have leg cramps or low back pain.  Visit your dentist if you have not gone during your pregnancy. Use a soft toothbrush to brush your teeth. Be gentle when you floss.  You can have sex (intercourse) unless your doctor tells you not to.  Go to your doctor visits. Get help if:  You feel dizzy.  You have mild cramps or pressure in your lower belly (abdomen).  You have a nagging pain in your belly area.  You continue to feel sick to your stomach (nauseous), throw up (vomit), or have watery poop (diarrhea).  You have bad smelling fluid coming from your vagina.  You have pain with peeing (urination). Get help right away if:  You have a fever.  You are leaking fluid from your vagina.  You have spotting or bleeding from your vagina.  You have severe belly cramping or pain.  You lose or gain weight rapidly.  You have trouble catching your breath and have chest pain.  You notice sudden or extreme puffiness (swelling) of your face, hands, ankles, feet, or legs.  You have not felt the baby move in over an hour.  You have severe headaches that do not go away with medicine.  You have vision changes. This information is not intended to replace advice given to you by your health care provider. Make sure you discuss any questions you have with your health care   provider. Document Released: 10/20/2009 Document Revised: 01/01/2016 Document Reviewed: 09/26/2012 Elsevier Interactive Patient Education  2017 Elsevier Inc.  

## 2017-12-23 NOTE — Progress Notes (Signed)
   PRENATAL VISIT NOTE  Subjective:  Andrea Barry is a 32 y.o. G4P0120 at [redacted]w[redacted]d being seen today for ongoing prenatal care.  She is currently monitored for the following issues for this high-risk pregnancy and has Supervision of high risk pregnancy, antepartum; Cervical incompetence in pregnancy, second trimester; Hypothyroidism; History of preterm delivery at 21 weeks, currently pregnant; and Short cervix during pregnancy in second trimester on their problem list.  Patient reports no complaints.  Contractions: Regular. Vag. Bleeding: None.  Movement: Present. Denies leaking of fluid.   The following portions of the patient's history were reviewed and updated as appropriate: allergies, current medications, past family history, past medical history, past social history, past surgical history and problem list. Problem list updated.  Objective:   Vitals:   12/23/17 1320  BP: 104/64  Pulse: 77  Weight: 147 lb 3.2 oz (66.8 kg)    Fetal Status: Fetal Heart Rate (bpm): 150   Movement: Present     General:  Alert, oriented and cooperative. Patient is in no acute distress.  Skin: Skin is warm and dry. No rash noted.   Cardiovascular: Normal heart rate noted  Respiratory: Normal respiratory effort, no problems with respiration noted  Abdomen: Soft, gravid, appropriate for gestational age.  Pain/Pressure: Present     Pelvic: Cervical exam deferred        Extremities: Normal range of motion.  Edema: None  Mental Status: Normal mood and affect. Normal behavior. Normal judgment and thought content.   Assessment and Plan:  Pregnancy: [id number] at [redacted]w[redacted]d  1. History of preterm delivery at 21 weeks, currently pregnant - HYDROXYprogesterone Caproate SOAJ 275 mg  2. Supervision of high risk pregnancy, antepartum Routine care  3. Short cervix during pregnancy in second trimester S/p Cerclage on 12/15/17 CL on 5/14  3.58  cm - CL U/S on 5/21 - Continue pelvic rest  Preterm labor symptoms and  general obstetric precautions including but not limited to vaginal bleeding, contractions, leaking of fluid and fetal movement were reviewed in detail with the patient. Please refer to After Visit Summary for other counseling recommendations.  Return in about 2 weeks (around 01/06/2018) for [facility].  Future Appointments  Date Time Provider [place]  12/27/2017  9:30 AM WH-MFC [place] 1 WH-MFCUS MFC-US  01/10/2018  9:30 AM [place] 5 WH-MFCUS MFC-US    Gailen Shelter, MD

## 2017-12-27 ENCOUNTER — Other Ambulatory Visit (HOSPITAL_COMMUNITY): Payer: Self-pay | Admitting: *Deleted

## 2017-12-27 ENCOUNTER — Ambulatory Visit (HOSPITAL_COMMUNITY)
Admission: RE | Admit: 2017-12-27 | Discharge: 2017-12-27 | Disposition: A | Discharge status: Home / Self Care | Payer: Managed Care, Other (non HMO) | Source: Ambulatory Visit | Attending: Nurse Practitioner | Admitting: Nurse Practitioner

## 2017-12-27 ENCOUNTER — Other Ambulatory Visit (HOSPITAL_COMMUNITY): Payer: Self-pay | Admitting: Obstetrics and Gynecology

## 2017-12-27 ENCOUNTER — Encounter (HOSPITAL_COMMUNITY): Payer: Self-pay

## 2017-12-27 DIAGNOSIS — O3432 Maternal care for cervical incompetence, second trimester: Secondary | ICD-10-CM

## 2017-12-27 DIAGNOSIS — O09899 Supervision of other high risk pregnancies, unspecified trimester: Secondary | ICD-10-CM

## 2017-12-27 DIAGNOSIS — O09219 Supervision of pregnancy with history of pre-term labor, unspecified trimester: Principal | ICD-10-CM

## 2017-12-27 DIAGNOSIS — O09212 Supervision of pregnancy with history of pre-term labor, second trimester: Secondary | ICD-10-CM | POA: Insufficient documentation

## 2017-12-27 DIAGNOSIS — Z3A23 23 weeks gestation of pregnancy: Secondary | ICD-10-CM | POA: Insufficient documentation

## 2017-12-29 ENCOUNTER — Ambulatory Visit (INDEPENDENT_AMBULATORY_CARE_PROVIDER_SITE_OTHER): Payer: Managed Care, Other (non HMO) | Admitting: *Deleted

## 2017-12-29 ENCOUNTER — Telehealth: Payer: Self-pay

## 2017-12-29 VITALS — BP 104/56 | HR 76

## 2017-12-29 DIAGNOSIS — O09219 Supervision of pregnancy with history of pre-term labor, unspecified trimester: Secondary | ICD-10-CM

## 2017-12-29 DIAGNOSIS — O09212 Supervision of pregnancy with history of pre-term labor, second trimester: Secondary | ICD-10-CM

## 2017-12-29 DIAGNOSIS — O09899 Supervision of other high risk pregnancies, unspecified trimester: Secondary | ICD-10-CM

## 2017-12-29 NOTE — Telephone Encounter (Signed)
Called pt with Spanish interpreter Mariel and informed pt that we have received her 17p and wanted to see if she can come in today for her injection.  Pt stated that she will be able to come in today @ 1400.  [place] office notified to put her on  schedule.

## 2018-01-04 ENCOUNTER — Ambulatory Visit (INDEPENDENT_AMBULATORY_CARE_PROVIDER_SITE_OTHER): Payer: Medicaid Other | Admitting: Obstetrics & Gynecology

## 2018-01-04 VITALS — BP 122/72 | HR 88 | Wt 152.0 lb

## 2018-01-04 DIAGNOSIS — O09212 Supervision of pregnancy with history of pre-term labor, second trimester: Secondary | ICD-10-CM | POA: Diagnosis not present

## 2018-01-04 DIAGNOSIS — O0992 Supervision of high risk pregnancy, unspecified, second trimester: Secondary | ICD-10-CM | POA: Diagnosis not present

## 2018-01-04 DIAGNOSIS — O3432 Maternal care for cervical incompetence, second trimester: Secondary | ICD-10-CM | POA: Diagnosis present

## 2018-01-04 DIAGNOSIS — O09219 Supervision of pregnancy with history of pre-term labor, unspecified trimester: Secondary | ICD-10-CM

## 2018-01-04 DIAGNOSIS — O0990 Supervision of high risk pregnancy, unspecified, unspecified trimester: Secondary | ICD-10-CM

## 2018-01-04 DIAGNOSIS — O09899 Supervision of other high risk pregnancies, unspecified trimester: Secondary | ICD-10-CM

## 2018-01-04 NOTE — Progress Notes (Signed)
   PRENATAL VISIT NOTE  Subjective:  Francetta Ilg is a 32 y.o. G4P0120 at [redacted]w[redacted]d being seen today for ongoing prenatal care.  She is currently monitored for the following issues for this high-risk pregnancy and has Supervision of high risk pregnancy, antepartum; Cervical incompetence in pregnancy, second trimester; Hypothyroidism; History of preterm delivery at 21 weeks, currently pregnant; and Short cervix during pregnancy in second trimester on their problem list.  Patient reports no complaints.  Contractions: Not present. Vag. Bleeding: None.  Movement: Present. Denies leaking of fluid.   The following portions of the patient's history were reviewed and updated as appropriate: allergies, current medications, past family history, past medical history, past social history, past surgical history and problem list. Problem list updated.  Objective:   Vitals:   01/04/18 0931  BP: 122/72  Pulse: 88  Weight: 152 lb (68.9 kg)    Fetal Status: Fetal Heart Rate (bpm): 153   Movement: Present     General:  Alert, oriented and cooperative. Patient is in no acute distress.  Skin: Skin is warm and dry. No rash noted.   Cardiovascular: Normal heart rate noted  Respiratory: Normal respiratory effort, no problems with respiration noted  Abdomen: Soft, gravid, appropriate for gestational age.  Pain/Pressure: Present     Pelvic: Cervical exam deferred        Extremities: Normal range of motion.  Edema: None  Mental Status: Normal mood and affect. Normal behavior. Normal judgment and thought content.   Assessment and Plan:  Pregnancy: [id number] at [redacted]w[redacted]d  1. Cervical incompetence in pregnancy, second trimester   2. History of preterm delivery at 21 weeks, currently pregnant - on 29 -p  3. Supervision of high risk pregnancy, antepartum - anatomy u/s tomorrow  Preterm labor symptoms and general obstetric precautions including but not limited to vaginal bleeding, contractions, leaking of fluid  and fetal movement were reviewed in detail with the patient. Please refer to After Visit Summary for other counseling recommendations.  No follow-ups on file.  Future Appointments  Date Time Provider [place]  01/05/2018 12:30 PM [place] 1 WH-MFCUS MFC-US  01/10/2018  9:30 AM WH-MFC [place] 5 WH-MFCUS MFC-US    Emily Filbert, MD

## 2018-01-04 NOTE — Progress Notes (Signed)
[place]

## 2018-01-05 ENCOUNTER — Encounter (HOSPITAL_COMMUNITY): Payer: Self-pay

## 2018-01-05 ENCOUNTER — Ambulatory Visit (HOSPITAL_COMMUNITY)
Admission: RE | Admit: 2018-01-05 | Discharge: 2018-01-05 | Disposition: A | Discharge status: Home / Self Care | Payer: Managed Care, Other (non HMO) | Source: Ambulatory Visit | Attending: Nurse Practitioner | Admitting: Nurse Practitioner

## 2018-01-05 DIAGNOSIS — E039 Hypothyroidism, unspecified: Secondary | ICD-10-CM | POA: Insufficient documentation

## 2018-01-05 DIAGNOSIS — O09212 Supervision of pregnancy with history of pre-term labor, second trimester: Secondary | ICD-10-CM | POA: Diagnosis not present

## 2018-01-05 DIAGNOSIS — O3432 Maternal care for cervical incompetence, second trimester: Secondary | ICD-10-CM

## 2018-01-05 DIAGNOSIS — Z3A24 24 weeks gestation of pregnancy: Secondary | ICD-10-CM | POA: Insufficient documentation

## 2018-01-05 DIAGNOSIS — O99282 Endocrine, nutritional and metabolic diseases complicating pregnancy, second trimester: Secondary | ICD-10-CM | POA: Diagnosis not present

## 2018-01-10 ENCOUNTER — Other Ambulatory Visit (HOSPITAL_COMMUNITY): Payer: Self-pay | Admitting: Obstetrics and Gynecology

## 2018-01-10 ENCOUNTER — Other Ambulatory Visit (HOSPITAL_COMMUNITY): Payer: Self-pay | Admitting: *Deleted

## 2018-01-10 ENCOUNTER — Encounter (HOSPITAL_COMMUNITY): Payer: Self-pay

## 2018-01-10 ENCOUNTER — Ambulatory Visit (HOSPITAL_COMMUNITY)
Admission: RE | Admit: 2018-01-10 | Discharge: 2018-01-10 | Disposition: A | Discharge status: Home / Self Care | Payer: Medicaid Other | Source: Ambulatory Visit | Attending: Nurse Practitioner | Admitting: Nurse Practitioner

## 2018-01-10 DIAGNOSIS — Z3A25 25 weeks gestation of pregnancy: Secondary | ICD-10-CM

## 2018-01-10 DIAGNOSIS — O09219 Supervision of pregnancy with history of pre-term labor, unspecified trimester: Secondary | ICD-10-CM

## 2018-01-10 DIAGNOSIS — O99283 Endocrine, nutritional and metabolic diseases complicating pregnancy, third trimester: Principal | ICD-10-CM

## 2018-01-10 DIAGNOSIS — O99282 Endocrine, nutritional and metabolic diseases complicating pregnancy, second trimester: Secondary | ICD-10-CM | POA: Diagnosis not present

## 2018-01-10 DIAGNOSIS — E039 Hypothyroidism, unspecified: Secondary | ICD-10-CM | POA: Diagnosis not present

## 2018-01-10 DIAGNOSIS — O09299 Supervision of pregnancy with other poor reproductive or obstetric history, unspecified trimester: Secondary | ICD-10-CM | POA: Diagnosis not present

## 2018-01-10 DIAGNOSIS — O3432 Maternal care for cervical incompetence, second trimester: Secondary | ICD-10-CM | POA: Diagnosis not present

## 2018-01-10 DIAGNOSIS — O09899 Supervision of other high risk pregnancies, unspecified trimester: Secondary | ICD-10-CM

## 2018-01-11 ENCOUNTER — Ambulatory Visit: Payer: Managed Care, Other (non HMO) | Admitting: General Practice

## 2018-01-11 VITALS — BP 115/64 | HR 88 | Ht 66.0 in | Wt 152.0 lb

## 2018-01-11 DIAGNOSIS — O09219 Supervision of pregnancy with history of pre-term labor, unspecified trimester: Principal | ICD-10-CM

## 2018-01-11 DIAGNOSIS — O09212 Supervision of pregnancy with history of pre-term labor, second trimester: Secondary | ICD-10-CM | POA: Diagnosis not present

## 2018-01-11 DIAGNOSIS — O09899 Supervision of other high risk pregnancies, unspecified trimester: Secondary | ICD-10-CM

## 2018-01-11 NOTE — Progress Notes (Signed)
Andrea Barry here for 17-P  Injection.  Injection administered without complication. Patient will return in one week for next injection.  Derinda Late, RN 01/11/2018  2:15 PM

## 2018-01-11 NOTE — Progress Notes (Signed)
Chart reviewed for nurse visit. Agree with plan of care.   Jorje Guild, NP 01/11/2018 2:38 PM

## 2018-01-18 ENCOUNTER — Other Ambulatory Visit: Payer: Self-pay

## 2018-01-18 ENCOUNTER — Ambulatory Visit (INDEPENDENT_AMBULATORY_CARE_PROVIDER_SITE_OTHER): Payer: Managed Care, Other (non HMO)

## 2018-01-18 VITALS — Wt 154.0 lb

## 2018-01-18 DIAGNOSIS — O09219 Supervision of pregnancy with history of pre-term labor, unspecified trimester: Principal | ICD-10-CM

## 2018-01-18 DIAGNOSIS — O09899 Supervision of other high risk pregnancies, unspecified trimester: Secondary | ICD-10-CM

## 2018-01-18 DIAGNOSIS — O09212 Supervision of pregnancy with history of pre-term labor, second trimester: Secondary | ICD-10-CM | POA: Diagnosis not present

## 2018-01-18 NOTE — Progress Notes (Signed)
Patient here today for Makena 17p sub Q injection.  Patient noticed some swelling and cramping.  Educated patient to keep feet elevated and hydrate.  Interpreter present.    Reordered injection today as well.

## 2018-01-25 ENCOUNTER — Other Ambulatory Visit: Payer: Managed Care, Other (non HMO)

## 2018-01-25 ENCOUNTER — Ambulatory Visit (INDEPENDENT_AMBULATORY_CARE_PROVIDER_SITE_OTHER): Payer: Managed Care, Other (non HMO) | Admitting: Obstetrics & Gynecology

## 2018-01-25 VITALS — BP 104/61 | HR 68

## 2018-01-25 DIAGNOSIS — E039 Hypothyroidism, unspecified: Secondary | ICD-10-CM

## 2018-01-25 DIAGNOSIS — O99282 Endocrine, nutritional and metabolic diseases complicating pregnancy, second trimester: Secondary | ICD-10-CM

## 2018-01-25 DIAGNOSIS — O0990 Supervision of high risk pregnancy, unspecified, unspecified trimester: Secondary | ICD-10-CM

## 2018-01-25 DIAGNOSIS — Z23 Encounter for immunization: Secondary | ICD-10-CM | POA: Diagnosis not present

## 2018-01-25 DIAGNOSIS — O26872 Cervical shortening, second trimester: Secondary | ICD-10-CM

## 2018-01-25 DIAGNOSIS — O3432 Maternal care for cervical incompetence, second trimester: Secondary | ICD-10-CM

## 2018-01-25 NOTE — Progress Notes (Signed)
   PRENATAL VISIT NOTE  Subjective:  Andrea Barry is a 32 y.o. G4P0120 at [redacted]w[redacted]d being seen today for ongoing prenatal care.  She is currently monitored for the following issues for this high-risk pregnancy and has Supervision of high risk pregnancy, antepartum; Cervical incompetence in pregnancy, second trimester; Hypothyroidism; History of preterm delivery at 21 weeks, currently pregnant; Short cervix during pregnancy in second trimester; Hypothyroidism during pregnancy in second trimester; [redacted] weeks gestation of pregnancy; Maternal care for cervical incompetence in second trimester; and Pregnancy with poor obstetric history on their problem list.  Patient reports URI symptoms.  Contractions: Not present. Vag. Bleeding: None.  Movement: Present. Denies leaking of fluid.   The following portions of the patient's history were reviewed and updated as appropriate: allergies, current medications, past family history, past medical history, past social history, past surgical history and problem list. Problem list updated.  Objective:   Vitals:   01/25/18 0835  BP: 104/61  Pulse: 68    Fetal Status: Fetal Heart Rate (bpm): 145   Movement: Present     General:  Alert, oriented and cooperative. Patient is in no acute distress.  Skin: Skin is warm and dry. No rash noted.   Cardiovascular: Normal heart rate noted  Respiratory: Normal respiratory effort, no problems with respiration noted  Abdomen: Soft, gravid, appropriate for gestational age.  Pain/Pressure: Present     Pelvic: Cervical exam deferred        Extremities: Normal range of motion.  Edema: None  Mental Status: Normal mood and affect. Normal behavior. Normal judgment and thought content.   Assessment and Plan:  Pregnancy: [id number] at [redacted]w[redacted]d  1. Supervision of high risk pregnancy, antepartum - MFM u/s follow up next month - Rec OTC meds for cold symptoms  2. Short cervix during pregnancy in second trimester - q 2 week cervical  length  3. Hypothyroidism during pregnancy in second trimester - check TSH  4. Cervical incompetence in pregnancy, second trimester - s/p cerclage, getting weekly 17-P - Since she is reporting vaginal burning from the prometrium, I told her that she can stop that   Preterm labor symptoms and general obstetric precautions including but not limited to vaginal bleeding, contractions, leaking of fluid and fetal movement were reviewed in detail with the patient. Please refer to After Visit Summary for other counseling recommendations.  No follow-ups on file.  Future Appointments  Date Time Provider [place]  01/25/2018 10:15 AM Emily Filbert, MD [facility] WOC  02/21/2018  9:30 AM WH-MFC [place] 1 WH-MFCUS MFC-US    Emily Filbert, MD

## 2018-01-25 NOTE — Progress Notes (Signed)
tdap vaccine

## 2018-01-26 LAB — CBC
HEMATOCRIT: 35.5 % (ref 34.0–46.6)
HEMOGLOBIN: 11.8 g/dL (ref 11.1–15.9)
MCH: 28.6 pg (ref 26.6–33.0)
MCHC: 33.2 g/dL (ref 31.5–35.7)
MCV: 86 fL (ref 79–97)
Platelets: 206 10*3/uL (ref 150–450)
RBC: 4.13 x10E6/uL (ref 3.77–5.28)
RDW: 17.9 % — AB (ref 12.3–15.4)
WBC: 7.4 10*3/uL (ref 3.4–10.8)

## 2018-01-26 LAB — TSH: TSH: 1.77 u[IU]/mL (ref 0.450–4.500)

## 2018-01-26 LAB — GLUCOSE TOLERANCE, 2 HOURS W/ 1HR
GLUCOSE, 1 HOUR: 120 mg/dL (ref 65–179)
GLUCOSE, 2 HOUR: 104 mg/dL (ref 65–152)
Glucose, Fasting: 79 mg/dL (ref 65–91)

## 2018-01-26 LAB — RPR: RPR: NONREACTIVE

## 2018-01-26 LAB — HIV ANTIBODY (ROUTINE TESTING W REFLEX): HIV SCREEN 4TH GENERATION: NONREACTIVE

## 2018-02-01 ENCOUNTER — Ambulatory Visit (INDEPENDENT_AMBULATORY_CARE_PROVIDER_SITE_OTHER): Payer: Managed Care, Other (non HMO) | Admitting: *Deleted

## 2018-02-01 VITALS — BP 110/67 | HR 85

## 2018-02-01 DIAGNOSIS — O0990 Supervision of high risk pregnancy, unspecified, unspecified trimester: Secondary | ICD-10-CM

## 2018-02-01 DIAGNOSIS — O09899 Supervision of other high risk pregnancies, unspecified trimester: Secondary | ICD-10-CM

## 2018-02-01 DIAGNOSIS — O09213 Supervision of pregnancy with history of pre-term labor, third trimester: Secondary | ICD-10-CM | POA: Diagnosis not present

## 2018-02-01 DIAGNOSIS — O09219 Supervision of pregnancy with history of pre-term labor, unspecified trimester: Secondary | ICD-10-CM

## 2018-02-01 NOTE — Progress Notes (Signed)
I have reviewed this chart and agree with the RN/CMA assessment and management.    Ralph Brouwer C Syler Norcia, MD, FACOG Attending Physician, Faculty Practice [facility]  

## 2018-02-08 ENCOUNTER — Encounter: Payer: Self-pay | Admitting: Obstetrics & Gynecology

## 2018-02-08 ENCOUNTER — Other Ambulatory Visit (HOSPITAL_COMMUNITY)
Admission: RE | Admit: 2018-02-08 | Discharge: 2018-02-08 | Disposition: A | Discharge status: Home / Self Care | Payer: Medicaid Other | Source: Ambulatory Visit | Attending: Obstetrics & Gynecology | Admitting: Obstetrics & Gynecology

## 2018-02-08 ENCOUNTER — Ambulatory Visit (INDEPENDENT_AMBULATORY_CARE_PROVIDER_SITE_OTHER): Payer: Medicaid Other | Admitting: Obstetrics & Gynecology

## 2018-02-08 VITALS — BP 116/73 | HR 86 | Wt 157.3 lb

## 2018-02-08 DIAGNOSIS — O3433 Maternal care for cervical incompetence, third trimester: Secondary | ICD-10-CM

## 2018-02-08 DIAGNOSIS — O09213 Supervision of pregnancy with history of pre-term labor, third trimester: Secondary | ICD-10-CM

## 2018-02-08 DIAGNOSIS — O26873 Cervical shortening, third trimester: Secondary | ICD-10-CM

## 2018-02-08 DIAGNOSIS — O09219 Supervision of pregnancy with history of pre-term labor, unspecified trimester: Principal | ICD-10-CM

## 2018-02-08 DIAGNOSIS — O26893 Other specified pregnancy related conditions, third trimester: Secondary | ICD-10-CM | POA: Insufficient documentation

## 2018-02-08 DIAGNOSIS — O0993 Supervision of high risk pregnancy, unspecified, third trimester: Secondary | ICD-10-CM | POA: Diagnosis not present

## 2018-02-08 DIAGNOSIS — N898 Other specified noninflammatory disorders of vagina: Secondary | ICD-10-CM | POA: Insufficient documentation

## 2018-02-08 DIAGNOSIS — O0990 Supervision of high risk pregnancy, unspecified, unspecified trimester: Secondary | ICD-10-CM

## 2018-02-08 DIAGNOSIS — O09899 Supervision of other high risk pregnancies, unspecified trimester: Secondary | ICD-10-CM

## 2018-02-08 NOTE — Patient Instructions (Signed)
Regrese a la clinica cuando tenga su cita. Si tiene problemas o preguntas, llama a la clinica o vaya a la sala de emergencia al [facility].    

## 2018-02-08 NOTE — Progress Notes (Signed)
   PRENATAL VISIT NOTE  Subjective:  Andrea Barry is a 32 y.o. G4P0120 at [redacted]w[redacted]d being seen today for ongoing prenatal care.  Patient is Spanish-speaking only, Spanish interpreter present for this encounter. She is currently monitored for the following issues for this high-risk pregnancy and has Supervision of high risk pregnancy, antepartum; Cervical incompetence, antepartum, third trimester; History of preterm delivery at 21 weeks, currently pregnant; Short cervix with cervical cerclage in third trimester, antepartum; and Hypothyroidism in pregnancy, third trimester on their problem list.  Patient reports vaginal irritation; yellowish vaginal discharge and vaginal itching for 2 weeks on/off.  Contractions: Not present. Vag. Bleeding: None.  Movement: Present. Denies leaking of fluid.   The following portions of the patient's history were reviewed and updated as appropriate: allergies, current medications, past family history, past medical history, past social history, past surgical history and problem list. Problem list updated.  Objective:   Vitals:   02/08/18 1556  BP: 116/73  Pulse: 86  Weight: 157 lb 4.8 oz (71.4 kg)    Fetal Status: Fetal Heart Rate (bpm): 150   Movement: Present     General:  Alert, oriented and cooperative. Patient is in no acute distress.  Skin: Skin is warm and dry. No rash noted.   Cardiovascular: Normal heart rate noted  Respiratory: Normal respiratory effort, no problems with respiration noted  Abdomen: Soft, gravid, appropriate for gestational age.  Pain/Pressure: Present     Pelvic: Cervical exam deferred        Extremities: Normal range of motion.  Edema: Trace  Mental Status: Normal mood and affect. Normal behavior. Normal judgment and thought content.   Assessment and Plan:  Pregnancy: [id number] at [redacted]w[redacted]d  1. Vaginal discharge during pregnancy in third trimester - Cervicovaginal ancillary done, will follow up results and manage accordingly.  2.  History of preterm delivery at 21 weeks, currently pregnant Continue weekly 17P.  3. Short cervix with cervical cerclage in third trimester, antepartum Cerclage in place, will remove around 36-37 weeks.   4. Supervision of high risk pregnancy, antepartum Preterm labor symptoms and general obstetric precautions including but not limited to vaginal bleeding, contractions, leaking of fluid and fetal movement were reviewed in detail with the patient. Please refer to After Visit Summary for other counseling recommendations.  Return in about 1 week (around 02/15/2018) for 17P only.  2 weeks: 17P and  OB Visit (HOB).  Future Appointments  Date Time Provider [place]  02/21/2018  9:30 AM WH-MFC [place] 1 WH-MFCUS MFC-US    Verita Schneiders, MD

## 2018-02-10 LAB — CERVICOVAGINAL ANCILLARY ONLY
Bacterial vaginitis: NEGATIVE
Candida vaginitis: NEGATIVE
Trichomonas: NEGATIVE

## 2018-02-15 ENCOUNTER — Ambulatory Visit (INDEPENDENT_AMBULATORY_CARE_PROVIDER_SITE_OTHER): Payer: Medicaid Other | Admitting: General Practice

## 2018-02-15 VITALS — BP 111/64 | HR 81 | Wt 160.0 lb

## 2018-02-15 DIAGNOSIS — O09219 Supervision of pregnancy with history of pre-term labor, unspecified trimester: Principal | ICD-10-CM

## 2018-02-15 DIAGNOSIS — O09899 Supervision of other high risk pregnancies, unspecified trimester: Secondary | ICD-10-CM

## 2018-02-15 DIAGNOSIS — O09213 Supervision of pregnancy with history of pre-term labor, third trimester: Secondary | ICD-10-CM | POA: Diagnosis present

## 2018-02-15 NOTE — Progress Notes (Signed)
Andrea Barry here for 17-P  Injection.  Injection administered without complication. Patient will return in one week for next injection.  [place], [place] 02/15/2018  10:21 AM

## 2018-02-15 NOTE — Progress Notes (Addendum)
Patient was seen by Lowanda Foster, CMA. Refill ordered on 17p

## 2018-02-16 ENCOUNTER — Ambulatory Visit: Payer: Self-pay

## 2018-02-21 ENCOUNTER — Encounter (HOSPITAL_COMMUNITY): Payer: Self-pay

## 2018-02-21 ENCOUNTER — Ambulatory Visit (HOSPITAL_COMMUNITY)
Admission: RE | Admit: 2018-02-21 | Discharge: 2018-02-21 | Disposition: A | Discharge status: Home / Self Care | Payer: Medicaid Other | Source: Ambulatory Visit | Attending: Obstetrics & Gynecology | Admitting: Obstetrics & Gynecology

## 2018-02-21 ENCOUNTER — Other Ambulatory Visit (HOSPITAL_COMMUNITY): Payer: Self-pay | Admitting: Obstetrics and Gynecology

## 2018-02-21 DIAGNOSIS — O09213 Supervision of pregnancy with history of pre-term labor, third trimester: Secondary | ICD-10-CM | POA: Diagnosis not present

## 2018-02-21 DIAGNOSIS — O09293 Supervision of pregnancy with other poor reproductive or obstetric history, third trimester: Secondary | ICD-10-CM | POA: Diagnosis not present

## 2018-02-21 DIAGNOSIS — O99283 Endocrine, nutritional and metabolic diseases complicating pregnancy, third trimester: Secondary | ICD-10-CM | POA: Insufficient documentation

## 2018-02-21 DIAGNOSIS — Z3A31 31 weeks gestation of pregnancy: Secondary | ICD-10-CM | POA: Insufficient documentation

## 2018-02-21 DIAGNOSIS — E039 Hypothyroidism, unspecified: Secondary | ICD-10-CM

## 2018-02-21 DIAGNOSIS — O3433 Maternal care for cervical incompetence, third trimester: Secondary | ICD-10-CM

## 2018-02-21 DIAGNOSIS — O09893 Supervision of other high risk pregnancies, third trimester: Secondary | ICD-10-CM

## 2018-02-23 ENCOUNTER — Ambulatory Visit: Payer: Self-pay

## 2018-02-28 ENCOUNTER — Ambulatory Visit (HOSPITAL_COMMUNITY): Payer: Self-pay

## 2018-03-01 ENCOUNTER — Ambulatory Visit (INDEPENDENT_AMBULATORY_CARE_PROVIDER_SITE_OTHER): Payer: Medicaid Other | Admitting: Obstetrics and Gynecology

## 2018-03-01 ENCOUNTER — Encounter: Payer: Self-pay | Admitting: Obstetrics and Gynecology

## 2018-03-01 VITALS — BP 111/70 | HR 73 | Wt 160.6 lb

## 2018-03-01 DIAGNOSIS — O3433 Maternal care for cervical incompetence, third trimester: Secondary | ICD-10-CM

## 2018-03-01 DIAGNOSIS — O09219 Supervision of pregnancy with history of pre-term labor, unspecified trimester: Secondary | ICD-10-CM

## 2018-03-01 DIAGNOSIS — O99283 Endocrine, nutritional and metabolic diseases complicating pregnancy, third trimester: Secondary | ICD-10-CM

## 2018-03-01 DIAGNOSIS — O0990 Supervision of high risk pregnancy, unspecified, unspecified trimester: Secondary | ICD-10-CM

## 2018-03-01 DIAGNOSIS — E039 Hypothyroidism, unspecified: Secondary | ICD-10-CM

## 2018-03-01 DIAGNOSIS — O09899 Supervision of other high risk pregnancies, unspecified trimester: Secondary | ICD-10-CM

## 2018-03-01 DIAGNOSIS — O26873 Cervical shortening, third trimester: Secondary | ICD-10-CM

## 2018-03-01 MED ORDER — HYDROXYPROGESTERONE CAPROATE 275 MG/1.1ML ~~LOC~~ SOAJ
275.0000 mg | Freq: Once | SUBCUTANEOUS | Status: DC
Start: 2018-03-01 — End: 2018-03-29

## 2018-03-01 NOTE — Patient Instructions (Signed)
Tercer trimestre de Media planner (Third Trimester of Pregnancy) El tercer trimestre comprende desde la [id number] hasta la [id number], es decir, desde el mes7 hasta el mes9. El tercer trimestre es un perodo en el que el feto crece rpidamente. Hacia el final del noveno mes, el feto mide alrededor de 20pulgadas (45cm) de largo y pesa entre 6 y 58 libras (2,700 y 55,500kg). CAMBIOS EN EL ORGANISMO Su organismo atraviesa por muchos cambios durante el [place], y estos varan de Ardelia Mems mujer a Theatre manager.  Seguir [employer]. Es de esperar que aumente entre 25 y 35libras (45 y 16kg) hacia el final del Media planner.  Podrn aparecer las primeras [employer] Corporation caderas, el abdomen y las [place].  Puede tener necesidad de Garment/textile technologist con ms frecuencia porque el feto baja hacia la pelvis y ejerce presin sobre la vejiga.  Debido al Glennis Brink podr sentir Victorio Palm estomacal con frecuencia.  Puede estar estreida, ya que ciertas hormonas enlentecen los movimientos de los msculos que [employer] desechos a travs de los intestinos.  Pueden aparecer hemorroides o abultarse e hincharse las venas (venas varicosas).  Puede sentir dolor plvico debido al [employer] y a que las hormonas del Scientist, research (life sciences) las articulaciones entre los huesos de la pelvis. El dolor de espalda puede ser consecuencia de la sobrecarga de los msculos que soportan la [place].  Tal vez haya cambios en el cabello que pueden incluir su engrosamiento, crecimiento rpido y cambios en la textura. Adems, a algunas mujeres se les cae el cabello durante o despus del embarazo, o tienen el cabello seco o fino. Lo ms probable es que el cabello se le normalice despus del nacimiento del beb.  Las [employer] seguirn creciendo y Teaching laboratory technician. A veces, puede haber una secrecin amarilla de las mamas llamada calostro.  El ombligo puede salir hacia afuera.  Puede sentir que le falta el aire debido a que se expande el tero.  Puede notar que el feto  "baja" o lo siente ms bajo, en el abdomen.  Puede tener una prdida de secrecin mucosa con sangre. Esto suele ocurrir en el trmino de unos pocos das a una semana antes de que comience el [place].  El cuello del tero se vuelve delgado y blando (se borra) cerca de la fecha de [place]. QU DEBE ESPERAR EN LOS EXMENES PRENATALES Le harn exmenes prenatales cada 2semanas hasta la semana36. A partir de ese momento le harn exmenes semanales. Durante una visita prenatal de rutina:  La pesarn para asegurarse de que usted y el feto estn creciendo normalmente.  Le tomarn la presin arterial.  Le medirn el abdomen para controlar el desarrollo del beb.  Se escucharn los latidos cardacos fetales.  Se evaluarn los resultados de los estudios solicitados en visitas anteriores.  Le revisarn el cuello del tero cuando est prxima la fecha de parto para controlar si este se ha borrado. Alrededor de la semana36, el mdico le revisar el cuello del tero. Al mismo tiempo, realizar un anlisis de las secreciones del tejido vaginal. Este examen es para determinar si hay un tipo de bacteria, estreptococo Grupo B. El mdico le explicar esto con ms detalle. El mdico puede preguntarle lo siguiente:  Cmo le gustara que fuera el [place].  Cmo se siente.  Si siente los movimientos del beb.  Si ha tenido sntomas anormales, como prdida de lquido, [place], dolores de cabeza intensos o clicos abdominales.  Si est consumiendo algn producto que contenga tabaco, como cigarrillos, tabaco de Higher education careers adviser y  cigarrillos electrnicos.  Si tiene [employer]. Otros exmenes o estudios de deteccin que pueden realizarse durante el tercer trimestre incluyen lo siguiente:  Anlisis de sangre para controlar los niveles de hierro (anemia).  Controles fetales para determinar su salud, nivel de [place] y Mining engineer. Si tiene [place] enfermedad o hay problemas durante el embarazo, le harn  estudios.  Prueba del VIH (virus de inmunodeficiencia humana). Si corre Electronics engineer, pueden realizarle una prueba de deteccin del VIH durante el tercer trimestre del embarazo. FALSO TRABAJO DE PARTO Es posible que sienta contracciones leves e irregulares que finalmente desaparecen. Se llaman contracciones de Braxton Hicks o falso trabajo de [place]. Las Yahoo pueden durar horas, das o incluso semanas, antes de que el verdadero trabajo de parto se inicie. Si las contracciones ocurren a intervalos regulares, se intensifican o se hacen dolorosas, lo mejor es que la revise el mdico. SIGNOS DE TRABAJO DE PARTO  Clicos de tipo menstrual.  Contracciones cada 93minutos o menos.  Contracciones que comienzan en la parte superior del tero y se extienden hacia abajo, a la zona inferior del abdomen y la espalda.  Sensacin de mayor presin en la pelvis o dolor de espalda.  Una secrecin de mucosidad acuosa o con sangre que sale de la vagina. Si tiene alguno de estos signos antes de la [id number] del Media planner, llame a su mdico de inmediato. Debe concurrir al hospital para que la controlen inmediatamente. INSTRUCCIONES PARA EL CUIDADO EN EL HOGAR  Evite fumar, consumir hierbas, beber alcohol y tomar frmacos que no le hayan recetado. Estas sustancias qumicas afectan la formacin y el desarrollo del beb.  No consuma ningn producto que contenga tabaco, lo que incluye cigarrillos, tabaco de Higher education careers adviser y Psychologist, sport and exercise. Si necesita ayuda para dejar de fumar, consulte al [employer]. Puede recibir asesoramiento y otro tipo de recursos para dejar de fumar.  [place] mdico en relacin con el uso de medicamentos. Durante el embarazo, hay medicamentos que son seguros de tomar y otros que no.  Haga ejercicio solamente como se lo haya indicado el mdico. Sentir clicos uterinos es un buen signo para Ambulance person actividad fsica.  Contine comiendo alimentos sanos con  regularidad.  Use un sostn que le brinde buen soporte si le Nordstrom.  No se d baos de inmersin en agua caliente, baos turcos ni saunas.  Use el cinturn de seguridad en todo momento mientras conduce.  No coma carne cruda ni queso sin cocinar; evite el contacto con las bandejas sanitarias de los gatos y la tierra que estos animales usan. Estos elementos contienen grmenes que pueden causar defectos congnitos en el beb.  [place].  Tome entre 1500 y 2000mg  de calcio diariamente comenzando en la TIRWER15 del embarazo Honor.  Si est estreida, pruebe un laxante suave (si el mdico lo autoriza). Consuma ms alimentos ricos en fibra, como vegetales y frutas frescos y Psychologist, prison and probation services. Beba gran cantidad de lquido para mantener la orina de tono claro o color amarillo plido.  Dese baos de asiento con agua tibia para Best boy o las molestias causadas por las hemorroides. Use una crema para las hemorroides si el mdico la autoriza.  Si tiene venas varicosas, use medias de descanso. Eleve los pies durante 68minutos, 3 o 4veces por da. Limite el consumo de sal en su dieta.  Evite levantar objetos pesados, use zapatos de tacones bajos y [place].  Descanse con las piernas elevadas si tiene  calambres o dolor de cintura.  Visite a su dentista si no lo ha Quarry manager. Use un cepillo de dientes blando para higienizarse los dientes y psese el hilo dental con suavidad.  Puede seguir [employer], a menos que el mdico le indique lo contrario.  No haga viajes largos excepto que sea absolutamente necesario y solo con la autorizacin del [place] clases prenatales para Development worker, international aid, Psychologist, prison and probation services y hacer preguntas sobre el [place] de parto y [place].  Haga un ensayo de la partida al hospital.  Prepare el bolso que llevar al hospital.  Prepare la habitacin del beb.  Concurra a todas  las visitas prenatales segn las indicaciones de su mdico.  SOLICITE ATENCIN MDICA SI:  No est segura de que est en trabajo de parto o de que ha roto la bolsa de las aguas.  Tiene mareos.  Siente clicos leves, presin en la pelvis o dolor persistente en el abdomen.  Tiene nuseas, vmitos o diarrea persistentes.  Margette Fast secrecin vaginal con mal olor.  Siente dolor al [employer] Airlines.  SOLICITE ATENCIN MDICA DE INMEDIATO SI:  Tiene fiebre.  Tiene una prdida de lquido por la vagina.  Tiene sangrado o pequeas prdidas vaginales.  Siente dolor intenso o clicos en el abdomen.  Sube o baja de peso rpidamente.  Tiene dificultad para respirar y siente dolor de pecho.  Sbitamente se le hinchan mucho el rostro, las [place], los tobillos, los pies o las piernas.  No ha sentido los movimientos del beb durante Leone Brand.  Siente un dolor de cabeza intenso que no se alivia con medicamentos.  Su visin se modifica.  Esta informacin no tiene Marine scientist el consejo del mdico. Asegrese de hacerle al mdico cualquier pregunta que tenga. Document Released: 05/05/2005 Document Revised: 08/16/2014 Document Reviewed: 09/26/2012 Elsevier Interactive Patient Education  2017 [employer].

## 2018-03-01 NOTE — Progress Notes (Signed)
Subjective:  Andrea Barry is a 32 y.o. G4P0120 at [redacted]w[redacted]d being seen today for ongoing prenatal care.  She is currently monitored for the following issues for this high-risk pregnancy and has Supervision of high risk pregnancy, antepartum; Cervical incompetence, antepartum, third trimester; History of preterm delivery at 21 weeks, currently pregnant; Short cervix with cervical cerclage in third trimester, antepartum; and Hypothyroidism in pregnancy, third trimester on their problem list.  Patient reports occasional contractions.  Contractions: Irritability. Vag. Bleeding: None.  Movement: Present. Denies leaking of fluid.   The following portions of the patient's history were reviewed and updated as appropriate: allergies, current medications, past family history, past medical history, past social history, past surgical history and problem list. Problem list updated.  Objective:   Vitals:   03/01/18 1532  BP: 111/70  Pulse: 73  Weight: 160 lb 9.6 oz (72.8 kg)    Fetal Status: Fetal Heart Rate (bpm): 154   Movement: Present     General:  Alert, oriented and cooperative. Patient is in no acute distress.  Skin: Skin is warm and dry. No rash noted.   Cardiovascular: Normal heart rate noted  Respiratory: Normal respiratory effort, no problems with respiration noted  Abdomen: Soft, gravid, appropriate for gestational age. Pain/Pressure: Present     Pelvic:  Cervical exam deferred        Extremities: Normal range of motion.  Edema: Trace  Mental Status: Normal mood and affect. Normal behavior. Normal judgment and thought content.   Urinalysis:      Assessment and Plan:  Pregnancy: G4P0120 at [redacted]w[redacted]d  1. Supervision of high risk pregnancy, antepartum Stable  2. Short cervix with cervical cerclage in third trimester, antepartum Cerclage in place  3. History of preterm delivery at 21 weeks, currently pregnant As above plus 17 OHP weekly  4. Hypothyroidism in pregnancy, third  trimester Nl TSh last month  5. Cervical incompetence, antepartum, third trimester Cerclage in place  Live interrupter used during today's visit  Preterm labor symptoms and general obstetric precautions including but not limited to vaginal bleeding, contractions, leaking of fluid and fetal movement were reviewed in detail with the patient. Please refer to After Visit Summary for other counseling recommendations.  Return in about 2 weeks (around 03/15/2018) for OB visit.   Chancy Milroy, MD

## 2018-03-07 ENCOUNTER — Ambulatory Visit (HOSPITAL_COMMUNITY): Payer: Self-pay

## 2018-03-09 ENCOUNTER — Ambulatory Visit (INDEPENDENT_AMBULATORY_CARE_PROVIDER_SITE_OTHER): Payer: Medicaid Other | Admitting: *Deleted

## 2018-03-09 VITALS — BP 110/64 | HR 82 | Ht 66.0 in | Wt 161.3 lb

## 2018-03-09 DIAGNOSIS — O09213 Supervision of pregnancy with history of pre-term labor, third trimester: Secondary | ICD-10-CM

## 2018-03-09 DIAGNOSIS — O09219 Supervision of pregnancy with history of pre-term labor, unspecified trimester: Principal | ICD-10-CM

## 2018-03-09 DIAGNOSIS — O09899 Supervision of other high risk pregnancies, unspecified trimester: Secondary | ICD-10-CM

## 2018-03-14 ENCOUNTER — Ambulatory Visit (HOSPITAL_COMMUNITY): Payer: Self-pay

## 2018-03-15 ENCOUNTER — Ambulatory Visit (INDEPENDENT_AMBULATORY_CARE_PROVIDER_SITE_OTHER): Payer: Medicaid Other | Admitting: Obstetrics & Gynecology

## 2018-03-15 VITALS — BP 125/77 | HR 75 | Wt 169.0 lb

## 2018-03-15 DIAGNOSIS — O09213 Supervision of pregnancy with history of pre-term labor, third trimester: Secondary | ICD-10-CM

## 2018-03-15 DIAGNOSIS — O3433 Maternal care for cervical incompetence, third trimester: Secondary | ICD-10-CM

## 2018-03-15 DIAGNOSIS — O26873 Cervical shortening, third trimester: Secondary | ICD-10-CM | POA: Diagnosis not present

## 2018-03-15 DIAGNOSIS — O0993 Supervision of high risk pregnancy, unspecified, third trimester: Secondary | ICD-10-CM | POA: Diagnosis not present

## 2018-03-15 DIAGNOSIS — O09219 Supervision of pregnancy with history of pre-term labor, unspecified trimester: Principal | ICD-10-CM

## 2018-03-15 DIAGNOSIS — O0990 Supervision of high risk pregnancy, unspecified, unspecified trimester: Secondary | ICD-10-CM

## 2018-03-15 DIAGNOSIS — O09899 Supervision of other high risk pregnancies, unspecified trimester: Secondary | ICD-10-CM

## 2018-03-15 NOTE — Progress Notes (Signed)
Patient reports intense cramping like a period since Friday.

## 2018-03-15 NOTE — Patient Instructions (Signed)
Return to clinic for any scheduled appointments or obstetric concerns, or go to [facility] for evaluation  

## 2018-03-15 NOTE — Progress Notes (Signed)
   PRENATAL VISIT NOTE  Subjective:  Andrea Barry is a 32 y.o. G4P0120 at [redacted]w[redacted]d being seen today for ongoing prenatal care.  She is currently monitored for the following issues for this high-risk pregnancy and has Supervision of high risk pregnancy, antepartum; Cervical incompetence, antepartum, third trimester; History of preterm delivery at 21 weeks, currently pregnant; Short cervix with cervical cerclage in third trimester, antepartum; and Hypothyroidism in pregnancy, third trimester on their problem list.  Patient reports cramping; no GI or GU symptoms.  Contractions: Irritability. Vag. Bleeding: None.  Movement: Present. Denies leaking of fluid.   The following portions of the patient's history were reviewed and updated as appropriate: allergies, current medications, past family history, past medical history, past social history, past surgical history and problem list. Problem list updated.  Objective:   Vitals:   03/15/18 1320  BP: 125/77  Pulse: 75  Weight: 169 lb (76.7 kg)    Fetal Status: Fetal Heart Rate (bpm): 131 Fundal Height: 34 cm Movement: Present     General:  Alert, oriented and cooperative. Patient is in no acute distress.  Skin: Skin is warm and dry. No rash noted.   Cardiovascular: Normal heart rate noted  Respiratory: Normal respiratory effort, no problems with respiration noted  Abdomen: Soft, gravid, appropriate for gestational age.  Pain/Pressure: Present     Pelvic: Cervical exam performed Dilation: Closed Effacement (%): 50 Station: Ballotable  Extremities: Normal range of motion.  Edema: Trace  Mental Status: Normal mood and affect. Normal behavior. Normal judgment and thought content.   Assessment and Plan:  Pregnancy: [id number] at [redacted]w[redacted]d  1. H/O preterm delivery, currently pregnant Continue weekly 17P  2. Short cervix with cervical cerclage in third trimester, antepartum Closed cervix, cerclage removal at 36-37 weeks.   3. Supervision of high risk  pregnancy, antepartum Preterm labor symptoms and general obstetric precautions including but not limited to vaginal bleeding, contractions, leaking of fluid and fetal movement were reviewed in detail with the patient. Please refer to After Visit Summary for other counseling recommendations.  Return in about 1 week (around 03/22/2018) for 17P only  2 weeks: 17P, OB Visit (HOB), Pelvic cultures.  Future Appointments  Date Time Provider [place]  03/29/2018  1:15 PM Sloan Leiter, MD [facility] WOC  04/05/2018  1:15 PM Sloan Leiter, MD Pierson Loyd Salvador, MD

## 2018-03-21 ENCOUNTER — Ambulatory Visit (HOSPITAL_COMMUNITY): Payer: Self-pay

## 2018-03-22 ENCOUNTER — Ambulatory Visit (INDEPENDENT_AMBULATORY_CARE_PROVIDER_SITE_OTHER): Payer: Medicaid Other | Admitting: General Practice

## 2018-03-22 VITALS — BP 110/65 | HR 83 | Ht 64.0 in | Wt 163.0 lb

## 2018-03-22 DIAGNOSIS — O09213 Supervision of pregnancy with history of pre-term labor, third trimester: Secondary | ICD-10-CM | POA: Diagnosis not present

## 2018-03-22 DIAGNOSIS — O09899 Supervision of other high risk pregnancies, unspecified trimester: Secondary | ICD-10-CM

## 2018-03-22 DIAGNOSIS — O09219 Supervision of pregnancy with history of pre-term labor, unspecified trimester: Principal | ICD-10-CM

## 2018-03-22 NOTE — Progress Notes (Signed)
I have reviewed the chart and agree with nursing staff's documentation of this patient's encounter.  Kerry Hough, PA-C 03/22/2018 10:27 AM

## 2018-03-22 NOTE — Progress Notes (Signed)
Andrea Barry here for 17-P  Injection.  Injection administered without complication. Patient will return in one week for next injection.  Derinda Late, RN 03/22/2018  10:10 AM

## 2018-03-29 ENCOUNTER — Other Ambulatory Visit (HOSPITAL_COMMUNITY)
Admission: RE | Admit: 2018-03-29 | Discharge: 2018-03-29 | Disposition: A | Discharge status: Home / Self Care | Payer: Medicaid Other | Source: Ambulatory Visit | Attending: Obstetrics and Gynecology | Admitting: Obstetrics and Gynecology

## 2018-03-29 ENCOUNTER — Ambulatory Visit (INDEPENDENT_AMBULATORY_CARE_PROVIDER_SITE_OTHER): Payer: Medicaid Other | Admitting: Obstetrics and Gynecology

## 2018-03-29 ENCOUNTER — Encounter: Payer: Self-pay | Admitting: Obstetrics and Gynecology

## 2018-03-29 VITALS — BP 114/65 | HR 91 | Wt 164.0 lb

## 2018-03-29 DIAGNOSIS — O99283 Endocrine, nutritional and metabolic diseases complicating pregnancy, third trimester: Secondary | ICD-10-CM | POA: Diagnosis not present

## 2018-03-29 DIAGNOSIS — O0990 Supervision of high risk pregnancy, unspecified, unspecified trimester: Secondary | ICD-10-CM | POA: Diagnosis not present

## 2018-03-29 DIAGNOSIS — E039 Hypothyroidism, unspecified: Secondary | ICD-10-CM

## 2018-03-29 DIAGNOSIS — O3433 Maternal care for cervical incompetence, third trimester: Secondary | ICD-10-CM

## 2018-03-29 DIAGNOSIS — Z789 Other specified health status: Secondary | ICD-10-CM

## 2018-03-29 DIAGNOSIS — O09213 Supervision of pregnancy with history of pre-term labor, third trimester: Secondary | ICD-10-CM

## 2018-03-29 DIAGNOSIS — Z758 Other problems related to medical facilities and other health care: Secondary | ICD-10-CM | POA: Insufficient documentation

## 2018-03-29 DIAGNOSIS — Z603 Acculturation difficulty: Secondary | ICD-10-CM

## 2018-03-29 DIAGNOSIS — Z3A36 36 weeks gestation of pregnancy: Secondary | ICD-10-CM | POA: Insufficient documentation

## 2018-03-29 DIAGNOSIS — O0993 Supervision of high risk pregnancy, unspecified, third trimester: Secondary | ICD-10-CM | POA: Diagnosis not present

## 2018-03-29 DIAGNOSIS — O09899 Supervision of other high risk pregnancies, unspecified trimester: Secondary | ICD-10-CM

## 2018-03-29 DIAGNOSIS — O26873 Cervical shortening, third trimester: Secondary | ICD-10-CM | POA: Diagnosis not present

## 2018-03-29 DIAGNOSIS — O09219 Supervision of pregnancy with history of pre-term labor, unspecified trimester: Secondary | ICD-10-CM

## 2018-03-29 NOTE — Progress Notes (Signed)
   PRENATAL VISIT NOTE  Subjective:  Andrea Barry is a 32 y.o. G4P0120 at [redacted]w[redacted]d being seen today for ongoing prenatal care.  She is currently monitored for the following issues for this high-risk pregnancy and has Supervision of high risk pregnancy, antepartum; Cervical incompetence, antepartum, third trimester; History of preterm delivery at 21 weeks, currently pregnant; Short cervix with cervical cerclage in third trimester, antepartum; Hypothyroidism in pregnancy, third trimester; and Language barrier on their problem list.  Patient reports cramping. Reports one week of cramping like a period.  Contractions: Not present. Vag. Bleeding: None.  Movement: Present. Denies leaking of fluid.   The following portions of the patient's history were reviewed and updated as appropriate: allergies, current medications, past family history, past medical history, past social history, past surgical history and problem list. Problem list updated.  Objective:   Vitals:   03/29/18 1321  BP: 114/65  Pulse: 91  Weight: 164 lb (74.4 kg)    Fetal Status: Fetal Heart Rate (bpm): 144   Movement: Present     General:  Alert, oriented and cooperative. Patient is in no acute distress.  Skin: Skin is warm and dry. No rash noted.   Cardiovascular: Normal heart rate noted  Respiratory: Normal respiratory effort, no problems with respiration noted  Abdomen: Soft, gravid, appropriate for gestational age.  Pain/Pressure: Present   Cephalic by palpation  Pelvic: Cervical exam deferred        Extremities: Normal range of motion.  Edema: None  Mental Status: Normal mood and affect. Normal behavior. Normal judgment and thought content.   Assessment and Plan:  Pregnancy: [id number] at [redacted]w[redacted]d  1. Hypothyroidism in pregnancy, third trimester No meds  2. Cervical incompetence, antepartum, third trimester Cerclage in place, removed today Patient counseled regarding risks, consent signed. Cerclage easily visible with  speculum exam. Tail grasped and cerclage cut with long handled scissors, removed intact. Minor amount of bleeding improved with pressure. Reviewed post cerclage instructions, to return to hospital with bleeding  3. History of preterm delivery at 21 weeks, currently pregnant  4. Supervision of high risk pregnancy, antepartum - Culture, beta strep (group b only) - GC/Chlamydia probe amp ([place])not at [facility]  5. Short cervix with cervical cerclage in third trimester, antepartum  6. Language barrier - spanish interpretor used  Preterm labor symptoms and general obstetric precautions including but not limited to vaginal bleeding, contractions, leaking of fluid and fetal movement were reviewed in detail with the patient. Please refer to After Visit Summary for other counseling recommendations.  Return in about 1 week (around 04/05/2018) for OB visit (MD).  Future Appointments  Date Time Provider [place]  04/05/2018  1:15 PM Sloan Leiter, MD [facility]    Sloan Leiter, MD

## 2018-03-30 LAB — GC/CHLAMYDIA PROBE AMP (~~LOC~~) NOT AT ARMC
CHLAMYDIA, DNA PROBE: NEGATIVE
Neisseria Gonorrhea: NEGATIVE

## 2018-04-02 LAB — CULTURE, BETA STREP (GROUP B ONLY): STREP GP B CULTURE: NEGATIVE

## 2018-04-05 ENCOUNTER — Encounter: Payer: Self-pay | Admitting: Obstetrics and Gynecology

## 2018-04-05 ENCOUNTER — Ambulatory Visit (INDEPENDENT_AMBULATORY_CARE_PROVIDER_SITE_OTHER): Payer: Medicaid Other | Admitting: Obstetrics and Gynecology

## 2018-04-05 ENCOUNTER — Other Ambulatory Visit (HOSPITAL_COMMUNITY)
Admission: RE | Admit: 2018-04-05 | Discharge: 2018-04-05 | Disposition: A | Discharge status: Home / Self Care | Payer: Medicaid Other | Source: Ambulatory Visit | Attending: Obstetrics and Gynecology | Admitting: Obstetrics and Gynecology

## 2018-04-05 VITALS — BP 106/74 | HR 95 | Wt 163.0 lb

## 2018-04-05 DIAGNOSIS — O99283 Endocrine, nutritional and metabolic diseases complicating pregnancy, third trimester: Secondary | ICD-10-CM

## 2018-04-05 DIAGNOSIS — E039 Hypothyroidism, unspecified: Secondary | ICD-10-CM

## 2018-04-05 DIAGNOSIS — N898 Other specified noninflammatory disorders of vagina: Secondary | ICD-10-CM | POA: Diagnosis present

## 2018-04-05 DIAGNOSIS — O0990 Supervision of high risk pregnancy, unspecified, unspecified trimester: Secondary | ICD-10-CM

## 2018-04-05 DIAGNOSIS — O3433 Maternal care for cervical incompetence, third trimester: Secondary | ICD-10-CM

## 2018-04-05 DIAGNOSIS — Z789 Other specified health status: Secondary | ICD-10-CM

## 2018-04-05 DIAGNOSIS — O26873 Cervical shortening, third trimester: Secondary | ICD-10-CM

## 2018-04-05 NOTE — Progress Notes (Signed)
Patient reports all over itching since Sunday as well as a yellow mucous discharge

## 2018-04-05 NOTE — Progress Notes (Signed)
   PRENATAL VISIT NOTE  Subjective:  Andrea Barry is a 32 y.o. G4P0120 at [redacted]w[redacted]d being seen today for ongoing prenatal care.  She is currently monitored for the following issues for this high-risk pregnancy and has Supervision of high risk pregnancy, antepartum; Cervical incompetence, antepartum, third trimester; History of preterm delivery at 21 weeks, currently pregnant; Short cervix with cervical cerclage in third trimester, antepartum; Hypothyroidism in pregnancy, third trimester; and Language barrier on their problem list.   Patient reports vaginal pain, yellow mucous discharge.  Contractions: Not present. Vag. Bleeding: None.  Movement: Present. Denies leaking of fluid.   The following portions of the patient's history were reviewed and updated as appropriate: allergies, current medications, past family history, past medical history, past social history, past surgical history and problem list. Problem list updated.  Objective:   Vitals:   04/05/18 1332  BP: 106/74  Pulse: 95  Weight: 163 lb (73.9 kg)    Fetal Status: Fetal Heart Rate (bpm): 134   Movement: Present     General:  Alert, oriented and cooperative. Patient is in no acute distress.  Skin: Skin is warm and dry. No rash noted.   Cardiovascular: Normal heart rate noted  Respiratory: Normal respiratory effort, no problems with respiration noted  Abdomen: Soft, gravid, appropriate for gestational age.  Pain/Pressure: Present     Pelvic: Cervical exam performed      2-3/50/-1, very soft, no pooling, thick white mucousy discharge in vagina  Extremities: Normal range of motion.  Edema: Trace  Mental Status: Normal mood and affect. Normal behavior. Normal judgment and thought content.    Assessment and Plan:  Pregnancy: [id number] at [redacted]w[redacted]d  1. Supervision of high risk pregnancy, antepartum  2. Short cervix with cervical cerclage in third trimester, antepartum Cerclage removed 36 weeks  3. Language barrier Teacher, early years/pre used  4. Hypothyroidism in pregnancy, third trimester Cont synthroid  5. Cervical incompetence, antepartum, third trimester  6. Vaginal discharge - swab sent  Term labor symptoms and general obstetric precautions including but not limited to vaginal bleeding, contractions, leaking of fluid and fetal movement were reviewed in detail with the patient. Please refer to After Visit Summary for other counseling recommendations.  Return in about 1 week (around 04/12/2018) for OB visit.  No future appointments.  Sloan Leiter, MD

## 2018-04-07 LAB — CERVICOVAGINAL ANCILLARY ONLY
Bacterial vaginitis: NEGATIVE
Candida vaginitis: NEGATIVE
Trichomonas: NEGATIVE

## 2018-04-12 ENCOUNTER — Ambulatory Visit (INDEPENDENT_AMBULATORY_CARE_PROVIDER_SITE_OTHER): Payer: Medicaid Other | Admitting: Advanced Practice Midwife

## 2018-04-12 VITALS — BP 106/72 | HR 87 | Wt 165.4 lb

## 2018-04-12 DIAGNOSIS — O0993 Supervision of high risk pregnancy, unspecified, third trimester: Secondary | ICD-10-CM

## 2018-04-12 DIAGNOSIS — Z789 Other specified health status: Secondary | ICD-10-CM

## 2018-04-12 DIAGNOSIS — M545 Low back pain, unspecified: Secondary | ICD-10-CM

## 2018-04-12 DIAGNOSIS — E039 Hypothyroidism, unspecified: Secondary | ICD-10-CM

## 2018-04-12 NOTE — Patient Instructions (Signed)
Vaginal Delivery Vaginal delivery means that you will give birth by pushing your baby out of your birth canal (vagina). A team of health care providers will help you before, during, and after vaginal delivery. Birth experiences are unique for every woman and every pregnancy, and birth experiences vary depending on where you choose to give birth. What should I do to prepare for my baby's birth? Before your baby is born, it is important to talk with your health care provider about:  Your labor and delivery preferences. These may include: ? Medicines that you may be given. ? How you will manage your pain. This might include non-medical pain relief techniques or injectable pain relief such as epidural analgesia. ? How you and your baby will be monitored during labor and delivery. ? Who may be in the labor and delivery room with you. ? Your feelings about surgical delivery of your baby (cesarean delivery, or C-section) if this becomes necessary. ? Your feelings about receiving donated blood through an IV tube (blood transfusion) if this becomes necessary.  Whether you are able: ? To take pictures or videos of the birth. ? To eat during labor and delivery. ? To move around, walk, or change positions during labor and delivery.  What to expect after your baby is born, such as: ? Whether delayed umbilical cord clamping and cutting is offered. ? Who will care for your baby right after birth. ? Medicines or tests that may be recommended for your baby. ? Whether breastfeeding is supported in your hospital or birth center. ? How long you will be in the hospital or birth center.  How any medical conditions you have may affect your baby or your labor and delivery experience.  To prepare for your baby's birth, you should also:  Attend all of your health care visits before delivery (prenatal visits) as recommended by your health care provider. This is important.  Prepare your home for your baby's  arrival. Make sure that you have: ? Diapers. ? Baby clothing. ? Feeding equipment. ? Safe sleeping arrangements for you and your baby.  Install a car seat in your vehicle. Have your car seat checked by a certified car seat installer to make sure that it is installed safely.  Think about who will help you with your new baby at home for at least the first several weeks after delivery.  What can I expect when I arrive at the birth center or hospital? Once you are in labor and have been admitted into the hospital or birth center, your health care provider may:  Review your pregnancy history and any concerns you have.  Insert an IV tube into one of your veins. This is used to give you fluids and medicines.  Check your blood pressure, pulse, temperature, and heart rate (vital signs).  Check whether your bag of water (amniotic sac) has broken (ruptured).  Talk with you about your birth plan and discuss pain control options.  Monitoring Your health care provider may monitor your contractions (uterine monitoring) and your baby's heart rate (fetal monitoring). You may need to be monitored:  Often, but not continuously (intermittently).  All the time or for long periods at a time (continuously). Continuous monitoring may be needed if: ? You are taking certain medicines, such as medicine to relieve pain or make your contractions stronger. ? You have pregnancy or labor complications.  Monitoring may be done by:  Placing a special stethoscope or a handheld monitoring device on your abdomen to   check your baby's heartbeat, and feeling your abdomen for contractions. This method of monitoring does not continuously record your baby's heartbeat or your contractions.  Placing monitors on your abdomen (external monitors) to record your baby's heartbeat and the frequency and length of contractions. You may not have to wear external monitors all the time.  Placing monitors inside of your uterus  (internal monitors) to record your baby's heartbeat and the frequency, length, and strength of your contractions. ? Your health care provider may use internal monitors if he or she needs more information about the strength of your contractions or your baby's heart rate. ? Internal monitors are put in place by passing a thin, flexible wire through your vagina and into your uterus. Depending on the type of monitor, it may remain in your uterus or on your baby's head until birth. ? Your health care provider will discuss the benefits and risks of internal monitoring with you and will ask for your permission before inserting the monitors.  Telemetry. This is a type of continuous monitoring that can be done with external or internal monitors. Instead of having to stay in bed, you are able to move around during telemetry. Ask your health care provider if telemetry is an option for you.  Physical exam Your health care provider may perform a physical exam. This may include:  Checking whether your baby is positioned: ? With the head toward your vagina (head-down). This is most common. ? With the head toward the top of your uterus (head-up or breech). If your baby is in a breech position, your health care provider may try to turn your baby to a head-down position so you can deliver vaginally. If it does not seem that your baby can be born vaginally, your provider may recommend surgery to deliver your baby. In rare cases, you may be able to deliver vaginally if your baby is head-up (breech delivery). ? Lying sideways (transverse). Babies that are lying sideways cannot be delivered vaginally.  Checking your cervix to determine: ? Whether it is thinning out (effacing). ? Whether it is opening up (dilating). ? How low your baby has moved into your birth canal.  What are the three stages of labor and delivery?  Normal labor and delivery is divided into the following three stages: Stage 1  Stage 1 is the  longest stage of labor, and it can last for hours or days. Stage 1 includes: ? Early labor. This is when contractions may be irregular, or regular and mild. Generally, early labor contractions are more than 10 minutes apart. ? Active labor. This is when contractions get longer, more regular, more frequent, and more intense. ? The transition phase. This is when contractions happen very close together, are very intense, and may last longer than during any other part of labor.  Contractions generally feel mild, infrequent, and irregular at first. They get stronger, more frequent (about every 2-3 minutes), and more regular as you progress from early labor through active labor and transition.  Many women progress through stage 1 naturally, but you may need help to continue making progress. If this happens, your health care provider may talk with you about: ? Rupturing your amniotic sac if it has not ruptured yet. ? Giving you medicine to help make your contractions stronger and more frequent.  Stage 1 ends when your cervix is completely dilated to 4 inches (10 cm) and completely effaced. This happens at the end of the transition phase. Stage 2  Once   your cervix is completely effaced and dilated to 4 inches (10 cm), you may start to feel an urge to push. It is common for the body to naturally take a rest before feeling the urge to push, especially if you received an epidural or certain other pain medicines. This rest period may last for up to 1-2 hours, depending on your unique labor experience.  During stage 2, contractions are generally less painful, because pushing helps relieve contraction pain. Instead of contraction pain, you may feel stretching and burning pain, especially when the widest part of your baby's head passes through the vaginal opening (crowning).  Your health care provider will closely monitor your pushing progress and your baby's progress through the vagina during stage 2.  Your  health care provider may massage the area of skin between your vaginal opening and anus (perineum) or apply warm compresses to your perineum. This helps it stretch as the baby's head starts to crown, which can help prevent perineal tearing. ? In some cases, an incision may be made in your perineum (episiotomy) to allow the baby to pass through the vaginal opening. An episiotomy helps to make the opening of the vagina larger to allow more room for the baby to fit through.  It is very important to breathe and focus so your health care provider can control the delivery of your baby's head. Your health care provider may have you decrease the intensity of your pushing, to help prevent perineal tearing.  After delivery of your baby's head, the shoulders and the rest of the body generally deliver very quickly and without difficulty.  Once your baby is delivered, the umbilical cord may be cut right away, or this may be delayed for 1-2 minutes, depending on your baby's health. This may vary among health care providers, hospitals, and birth centers.  If you and your baby are healthy enough, your baby may be placed on your chest or abdomen to help maintain the baby's temperature and to help you bond with each other. Some mothers and babies start breastfeeding at this time. Your health care team will dry your baby and help keep your baby warm during this time.  Your baby may need immediate care if he or she: ? Showed signs of distress during labor. ? Has a medical condition. ? Was born too early (prematurely). ? Had a bowel movement before birth (meconium). ? Shows signs of difficulty transitioning from being inside the uterus to being outside of the uterus. If you are planning to breastfeed, your health care team will help you begin a feeding. Stage 3  The third stage of labor starts immediately after the birth of your baby and ends after you deliver the placenta. The placenta is an organ that develops  during pregnancy to provide oxygen and nutrients to your baby in the womb.  Delivering the placenta may require some pushing, and you may have mild contractions. Breastfeeding can stimulate contractions to help you deliver the placenta.  After the placenta is delivered, your uterus should tighten (contract) and become firm. This helps to stop bleeding in your uterus. To help your uterus contract and to control bleeding, your health care provider may: ? Give you medicine by injection, through an IV tube, by mouth, or through your rectum (rectally). ? Massage your abdomen or perform a vaginal exam to remove any blood clots that are left in your uterus. ? Empty your bladder by placing a thin, flexible tube (catheter) into your bladder. ? Encourage   you to breastfeed your baby. After labor is over, you and your baby will be monitored closely to ensure that you are both healthy until you are ready to go home. Your health care team will teach you how to care for yourself and your baby. This information is not intended to replace advice given to you by your health care provider. Make sure you discuss any questions you have with your health care provider. Document Released: 05/04/2008 Document Revised: 02/13/2016 Document Reviewed: 08/10/2015 Elsevier Interactive Patient Education  2018 [employer]  

## 2018-04-12 NOTE — Progress Notes (Signed)
   PRENATAL VISIT NOTE  Subjective:  Andrea Barry is a 32 y.o. G4P0120 at [redacted]w[redacted]d being seen today for ongoing prenatal care.  She is currently monitored for the following issues for this high-risk pregnancy and has Supervision of high risk pregnancy, antepartum; Cervical incompetence, antepartum, third trimester; History of preterm delivery at 21 weeks, currently pregnant; Short cervix with cervical cerclage in third trimester, antepartum; Hypothyroidism in pregnancy, third trimester; and Language barrier on their problem list.  Patient reports backache.  Contractions: Not present. Vag. Bleeding: None.  Movement: Present. Denies leaking of fluid.   The following portions of the patient's history were reviewed and updated as appropriate: allergies, current medications, past family history, past medical history, past social history, past surgical history and problem list. Problem list updated.  Objective:   Vitals:   04/12/18 1506  BP: 106/72  Pulse: 87  Weight: 165 lb 6.4 oz (75 kg)    Fetal Status: Fetal Heart Rate (bpm): 164   Movement: Present     General:  Alert, oriented and cooperative. Patient is in no acute distress.  Skin: Skin is warm and dry. No rash noted.   Cardiovascular: Normal heart rate noted  Respiratory: Normal respiratory effort, no problems with respiration noted  Abdomen: Soft, gravid, appropriate for gestational age.  Pain/Pressure: Present     Pelvic: Cervical exam performed  3/thick/ballotable      Extremities: Normal range of motion.  Edema: Trace  Mental Status: Normal mood and affect. Normal behavior. Normal judgment and thought content.   Assessment and Plan:  Pregnancy: [id number] at [redacted]w[redacted]d  1. Supervision of high risk pregnancy, antepartum, third trimester - Cerclage removed 03/29/18   2. Language barrier - Contract interpreter at bedside for all patient interaction  3. Hypothyroidism, unspecified type - Compliant with prescribed Synthroid  4.  Acute bilateral low back pain without sciatica - Managed with Tramadol as prescribed   Term labor symptoms and general obstetric precautions including but not limited to vaginal bleeding, contractions, leaking of fluid and fetal movement were reviewed in detail with the patient. Please refer to After Visit Summary for other counseling recommendations.  Return in about 1 week (around 04/19/2018).  Future Appointments  Date Time Provider [place]  04/19/2018  1:35 PM Emily Filbert, MD [place], [place] 04/12/18  3:23 PM

## 2018-04-19 ENCOUNTER — Ambulatory Visit (INDEPENDENT_AMBULATORY_CARE_PROVIDER_SITE_OTHER): Payer: Medicaid Other | Admitting: Obstetrics & Gynecology

## 2018-04-19 VITALS — BP 114/65 | HR 75 | Wt 166.6 lb

## 2018-04-19 DIAGNOSIS — O3433 Maternal care for cervical incompetence, third trimester: Secondary | ICD-10-CM

## 2018-04-19 DIAGNOSIS — O0993 Supervision of high risk pregnancy, unspecified, third trimester: Secondary | ICD-10-CM

## 2018-04-19 DIAGNOSIS — E039 Hypothyroidism, unspecified: Secondary | ICD-10-CM

## 2018-04-19 DIAGNOSIS — O0990 Supervision of high risk pregnancy, unspecified, unspecified trimester: Secondary | ICD-10-CM

## 2018-04-19 DIAGNOSIS — O99283 Endocrine, nutritional and metabolic diseases complicating pregnancy, third trimester: Secondary | ICD-10-CM

## 2018-04-19 NOTE — Progress Notes (Signed)
Interpreter Rudene Anda

## 2018-04-19 NOTE — Progress Notes (Signed)
   PRENATAL VISIT NOTE  Subjective:  Andrea Barry is a 32 y.o. G4P0120 at [redacted]w[redacted]d being seen today for ongoing prenatal care.  She is currently monitored for the following issues for this low-risk pregnancy and has Supervision of high risk pregnancy, antepartum; Cervical incompetence, antepartum, third trimester; History of preterm delivery at 21 weeks, currently pregnant; Short cervix with cervical cerclage in third trimester, antepartum; Hypothyroidism in pregnancy, third trimester; and Language barrier on their problem list.  Patient reports no complaints.  Contractions: Irregular. Vag. Bleeding: None.  Movement: Present. Denies leaking of fluid.   The following portions of the patient's history were reviewed and updated as appropriate: allergies, current medications, past family history, past medical history, past social history, past surgical history and problem list. Problem list updated.  Objective:   Vitals:   04/19/18 1351  BP: 114/65  Pulse: 75  Weight: 75.6 kg    Fetal Status: Fetal Heart Rate (bpm): 155   Movement: Present     General:  Alert, oriented and cooperative. Patient is in no acute distress.  Skin: Skin is warm and dry. No rash noted.   Cardiovascular: Normal heart rate noted  Respiratory: Normal respiratory effort, no problems with respiration noted  Abdomen: Soft, gravid, appropriate for gestational age.  Pain/Pressure: Present     Pelvic: Cervical exam performed        Extremities: Normal range of motion.  Edema: Trace  Mental Status: Normal mood and affect. Normal behavior. Normal judgment and thought content.   Assessment and Plan:  Pregnancy: [id number] at [redacted]w[redacted]d  1. Cervical incompetence, antepartum, third trimester  2. Hypothyroidism in pregnancy, third trimester  3. Supervision of high risk pregnancy, antepartum   Term labor symptoms and general obstetric precautions including but not limited to vaginal bleeding, contractions, leaking of fluid and  fetal movement were reviewed in detail with the patient. Please refer to After Visit Summary for other counseling recommendations.  No follow-ups on file.  No future appointments.  Emily Filbert, MD

## 2018-04-20 ENCOUNTER — Inpatient Hospital Stay (HOSPITAL_COMMUNITY): Payer: Medicaid Other | Admitting: Anesthesiology

## 2018-04-20 ENCOUNTER — Other Ambulatory Visit: Payer: Self-pay

## 2018-04-20 ENCOUNTER — Inpatient Hospital Stay (HOSPITAL_COMMUNITY)
Admission: AD | Admit: 2018-04-20 | Discharge: 2018-04-21 | DRG: 806 | Disposition: A | Discharge status: Home / Self Care | Payer: Medicaid Other | Attending: Obstetrics and Gynecology | Admitting: Obstetrics and Gynecology

## 2018-04-20 ENCOUNTER — Encounter (HOSPITAL_COMMUNITY): Payer: Self-pay

## 2018-04-20 DIAGNOSIS — Z3A39 39 weeks gestation of pregnancy: Secondary | ICD-10-CM

## 2018-04-20 DIAGNOSIS — O479 False labor, unspecified: Secondary | ICD-10-CM | POA: Diagnosis present

## 2018-04-20 DIAGNOSIS — E039 Hypothyroidism, unspecified: Secondary | ICD-10-CM | POA: Diagnosis present

## 2018-04-20 DIAGNOSIS — R87610 Atypical squamous cells of undetermined significance on cytologic smear of cervix (ASC-US): Secondary | ICD-10-CM | POA: Diagnosis present

## 2018-04-20 DIAGNOSIS — Z789 Other specified health status: Secondary | ICD-10-CM | POA: Diagnosis present

## 2018-04-20 DIAGNOSIS — Z3483 Encounter for supervision of other normal pregnancy, third trimester: Secondary | ICD-10-CM | POA: Diagnosis present

## 2018-04-20 DIAGNOSIS — Z758 Other problems related to medical facilities and other health care: Secondary | ICD-10-CM | POA: Diagnosis present

## 2018-04-20 DIAGNOSIS — O26873 Cervical shortening, third trimester: Secondary | ICD-10-CM | POA: Diagnosis present

## 2018-04-20 DIAGNOSIS — O09219 Supervision of pregnancy with history of pre-term labor, unspecified trimester: Secondary | ICD-10-CM

## 2018-04-20 DIAGNOSIS — O3433 Maternal care for cervical incompetence, third trimester: Secondary | ICD-10-CM | POA: Diagnosis present

## 2018-04-20 DIAGNOSIS — O09899 Supervision of other high risk pregnancies, unspecified trimester: Secondary | ICD-10-CM

## 2018-04-20 LAB — CBC
HCT: 36.9 % (ref 36.0–46.0)
HEMOGLOBIN: 11.7 g/dL — AB (ref 12.0–15.0)
MCH: 28.5 pg (ref 26.0–34.0)
MCHC: 31.7 g/dL (ref 30.0–36.0)
MCV: 89.8 fL (ref 78.0–100.0)
Platelets: 217 10*3/uL (ref 150–400)
RBC: 4.11 MIL/uL (ref 3.87–5.11)
RDW: 15.2 % (ref 11.5–15.5)
WBC: 10.1 10*3/uL (ref 4.0–10.5)

## 2018-04-20 LAB — RPR: RPR: NONREACTIVE

## 2018-04-20 LAB — TYPE AND SCREEN
ABO/RH(D): A POS
Antibody Screen: NEGATIVE

## 2018-04-20 MED ORDER — IBUPROFEN 600 MG PO TABS
600.0000 mg | ORAL_TABLET | Freq: Four times a day (QID) | ORAL | Status: DC
  Administered 2018-04-20 – 2018-04-21 (×5): 600 mg via ORAL
  Filled 2018-04-20 (×5): qty 1

## 2018-04-20 MED ORDER — LACTATED RINGERS IV SOLN
500.0000 mL | Freq: Once | INTRAVENOUS | Status: AC
  Administered 2018-04-20: 500 mL via INTRAVENOUS

## 2018-04-20 MED ORDER — OXYTOCIN 40 UNITS IN LACTATED RINGERS INFUSION - SIMPLE MED
2.5000 [IU]/h | INTRAVENOUS | Status: DC
  Filled 2018-04-20: qty 1000

## 2018-04-20 MED ORDER — ACETAMINOPHEN 325 MG PO TABS: 650.0000 mg | ORAL_TABLET | ORAL | Status: DC | PRN

## 2018-04-20 MED ORDER — LIDOCAINE HCL (PF) 1 % IJ SOLN
INTRAMUSCULAR | Status: DC | PRN
  Administered 2018-04-20: 13 mL via EPIDURAL

## 2018-04-20 MED ORDER — LACTATED RINGERS IV SOLN
INTRAVENOUS | Status: DC
  Administered 2018-04-20: 02:00:00 via INTRAVENOUS

## 2018-04-20 MED ORDER — OXYCODONE-ACETAMINOPHEN 5-325 MG PO TABS: 2.0000 | ORAL_TABLET | ORAL | Status: DC | PRN

## 2018-04-20 MED ORDER — WITCH HAZEL-GLYCERIN EX PADS: 1.0000 "application " | MEDICATED_PAD | CUTANEOUS | Status: DC | PRN

## 2018-04-20 MED ORDER — FERROUS SULFATE 325 (65 FE) MG PO TABS
325.0000 mg | ORAL_TABLET | Freq: Every day | ORAL | Status: DC
  Administered 2018-04-21: 325 mg via ORAL
  Filled 2018-04-20: qty 1

## 2018-04-20 MED ORDER — DIBUCAINE 1 % RE OINT: 1.0000 "application " | TOPICAL_OINTMENT | RECTAL | Status: DC | PRN

## 2018-04-20 MED ORDER — EPHEDRINE 5 MG/ML INJ
10.0000 mg | INTRAVENOUS | Status: DC | PRN
  Filled 2018-04-20: qty 2

## 2018-04-20 MED ORDER — ZOLPIDEM TARTRATE 5 MG PO TABS: 5.0000 mg | ORAL_TABLET | Freq: Every evening | ORAL | Status: DC | PRN

## 2018-04-20 MED ORDER — PHENYLEPHRINE 40 MCG/ML (10ML) SYRINGE FOR IV PUSH (FOR BLOOD PRESSURE SUPPORT)
80.0000 ug | PREFILLED_SYRINGE | INTRAVENOUS | Status: DC | PRN
  Filled 2018-04-20: qty 5

## 2018-04-20 MED ORDER — ONDANSETRON HCL 4 MG PO TABS: 4.0000 mg | ORAL_TABLET | ORAL | Status: DC | PRN

## 2018-04-20 MED ORDER — SIMETHICONE 80 MG PO CHEW: 80.0000 mg | CHEWABLE_TABLET | ORAL | Status: DC | PRN

## 2018-04-20 MED ORDER — ONDANSETRON HCL 4 MG/2ML IJ SOLN: 4.0000 mg | Freq: Four times a day (QID) | INTRAMUSCULAR | Status: DC | PRN

## 2018-04-20 MED ORDER — FENTANYL CITRATE (PF) 100 MCG/2ML IJ SOLN: 100.0000 ug | INTRAMUSCULAR | Status: DC | PRN

## 2018-04-20 MED ORDER — FENTANYL 2.5 MCG/ML BUPIVACAINE 1/10 % EPIDURAL INFUSION (WH - ANES)
14.0000 mL/h | INTRAMUSCULAR | Status: DC | PRN
  Administered 2018-04-20 (×2): 14 mL/h via EPIDURAL
  Filled 2018-04-20: qty 100

## 2018-04-20 MED ORDER — FENTANYL CITRATE (PF) 100 MCG/2ML IJ SOLN
INTRAMUSCULAR | Status: AC
  Administered 2018-04-20: 03:00:00
  Filled 2018-04-20: qty 2

## 2018-04-20 MED ORDER — SENNOSIDES-DOCUSATE SODIUM 8.6-50 MG PO TABS
2.0000 | ORAL_TABLET | ORAL | Status: DC
  Administered 2018-04-20: 2 via ORAL
  Filled 2018-04-20: qty 2

## 2018-04-20 MED ORDER — LACTATED RINGERS IV SOLN: 500.0000 mL | INTRAVENOUS | Status: DC | PRN

## 2018-04-20 MED ORDER — SOD CITRATE-CITRIC ACID 500-334 MG/5ML PO SOLN: 30.0000 mL | ORAL | Status: DC | PRN

## 2018-04-20 MED ORDER — ONDANSETRON HCL 4 MG/2ML IJ SOLN: 4.0000 mg | INTRAMUSCULAR | Status: DC | PRN

## 2018-04-20 MED ORDER — BENZOCAINE-MENTHOL 20-0.5 % EX AERO: 1.0000 "application " | INHALATION_SPRAY | CUTANEOUS | Status: DC | PRN

## 2018-04-20 MED ORDER — OXYCODONE-ACETAMINOPHEN 5-325 MG PO TABS: 1.0000 | ORAL_TABLET | ORAL | Status: DC | PRN

## 2018-04-20 MED ORDER — ONDANSETRON HCL 4 MG/2ML IJ SOLN
INTRAMUSCULAR | Status: AC
  Administered 2018-04-20: 4 mg
  Filled 2018-04-20: qty 2

## 2018-04-20 MED ORDER — PRENATAL MULTIVITAMIN CH
1.0000 | ORAL_TABLET | Freq: Every day | ORAL | Status: DC
  Administered 2018-04-21: 1 via ORAL
  Filled 2018-04-20: qty 1

## 2018-04-20 MED ORDER — DIPHENHYDRAMINE HCL 50 MG/ML IJ SOLN: 12.5000 mg | INTRAMUSCULAR | Status: DC | PRN

## 2018-04-20 MED ORDER — DIPHENHYDRAMINE HCL 25 MG PO CAPS: 25.0000 mg | ORAL_CAPSULE | Freq: Four times a day (QID) | ORAL | Status: DC | PRN

## 2018-04-20 MED ORDER — LIDOCAINE HCL (PF) 1 % IJ SOLN
30.0000 mL | INTRAMUSCULAR | Status: DC | PRN
  Filled 2018-04-20: qty 30

## 2018-04-20 MED ORDER — TETANUS-DIPHTH-ACELL PERTUSSIS 5-2.5-18.5 LF-MCG/0.5 IM SUSP: 0.5000 mL | Freq: Once | INTRAMUSCULAR | Status: DC

## 2018-04-20 MED ORDER — ACETAMINOPHEN 325 MG PO TABS
650.0000 mg | ORAL_TABLET | ORAL | Status: DC | PRN
  Administered 2018-04-20: 650 mg via ORAL
  Filled 2018-04-20: qty 2

## 2018-04-20 MED ORDER — OXYTOCIN BOLUS FROM INFUSION
500.0000 mL | Freq: Once | INTRAVENOUS | Status: AC
  Administered 2018-04-20: 500 mL via INTRAVENOUS

## 2018-04-20 MED ORDER — COCONUT OIL OIL: 1.0000 "application " | TOPICAL_OIL | Status: DC | PRN

## 2018-04-20 NOTE — Lactation Note (Signed)
This note was copied from a baby's chart. Lactation Consultation Note  Patient Name: Andrea Barry [id number] Date: 04/20/2018 Reason for consult: Initial assessment;Primapara;1st time breastfeeding;Term  24 hours old female who is being exclusively BF by her mother, she's a P1. Mom's feeding choice upon admission was to do both, she may decide to supplement at some point. Mom participated in the [facility] program during this pregnancy at the [facility]. Mom already knows how to hand express, when reviewing hand expression with mom she got colostrum on both nipples, but mostly on the right one. Noticed that mom has asymmetrical nipples. The right one is fully everted, and rounded almost like the teat of an artificial bottle nipple but the left one is inverted, but everts after stimulation.  Set mom up with a hand pump (mom doesn't have one at home), pump instructions, cleaning and storage were reviewed as well as milk storage guidelines. Offered assistance with latch and mom agreed to wake baby up. LC changed baby's diaper, she had a dirty diaper. Mom was concerned that baby is not taking the breast because she's very spitty and she's been spitting up a yellowish fluid, she had to use the bulb syringe multiple times.  LC took baby STS to mom's right breast in football position but she wasn't able to latch on, she was still very sleepy and kept burping and spitting up. LC used the bulb syringe again, an attempt was documented in Flowsheets. Discussed cluster feeding. LC also assisted mom with some questions regarding her PP care, LC had to ask RN, mom is Spanish speaker.  Plan  1. Encouraged mom to feed baby STS 8-12 times/24 hours or sooner if feeding cues are present 2. Hand expression and spoon feeding was also encouraged 3. Mom will pre-pump prior feedings to help evert her left nipple and prime her milk ducts  BF brochure (SP), BF resources and feeding diary (SP) was also discussed, both parents are  aware of [place] services and will call PRN.  Maternal Data Formula Feeding for Exclusion: Yes Reason for exclusion: Mother's choice to formula and breast feed on admission Has patient been taught Hand Expression?: Yes Does the patient have breastfeeding experience prior to this delivery?: No  Feeding Feeding Type: Breast Fed  LATCH Score Latch: Grasps breast easily, tongue down, lips flanged, rhythmical sucking.  Audible Swallowing: A few with stimulation  Type of Nipple: Everted at rest and after stimulation  Comfort (Breast/Nipple): Soft / non-tender  Hold (Positioning): No assistance needed to correctly position infant at breast.  LATCH Score: 9  Interventions Interventions: Breast feeding basics reviewed;Assisted with latch;Skin to skin;Breast massage;Hand express;Pre-pump if needed;Hand pump;Breast compression;Adjust position;Support pillows  Lactation Tools Discussed/Used Tools: Pump Breast pump type: Manual WIC Program: Yes Pump Review: Setup, frequency, and cleaning;Milk Storage Initiated by:: MPeck Date initiated:: 04/20/18   Consult Status Consult Status: Follow-up Date: 04/21/18 Follow-up type: In-patient    Mariadelcarmen Corella Francene Boyers 04/20/2018, 8:11 PM

## 2018-04-20 NOTE — Anesthesia Preprocedure Evaluation (Signed)
Anesthesia Evaluation  Patient identified by MRN, date of birth, ID band Patient awake    Reviewed: Allergy & Precautions, NPO status , Patient's Chart, lab work & pertinent test results  Airway Mallampati: II  TM Distance: >3 FB Neck ROM: Full    Dental no notable dental hx.    Pulmonary neg pulmonary ROS,    Pulmonary exam normal breath sounds clear to auscultation       Cardiovascular negative cardio ROS Normal cardiovascular exam Rhythm:Regular Rate:Normal     Neuro/Psych negative neurological ROS  negative psych ROS   GI/Hepatic negative GI ROS, Neg liver ROS,   Endo/Other  negative endocrine ROS  Renal/GU negative Renal ROS  negative genitourinary   Musculoskeletal negative musculoskeletal ROS (+)   Abdominal   Peds negative pediatric ROS (+)  Hematology negative hematology ROS (+)   Anesthesia Other Findings   Reproductive/Obstetrics negative OB ROS (+) Pregnancy                             Anesthesia Physical Anesthesia Plan  ASA: II  Anesthesia Plan: Epidural   Post-op Pain Management:    Induction:   PONV Risk Score and Plan:   Airway Management Planned:   Additional Equipment:   Intra-op Plan:   Post-operative Plan:   Informed Consent:   Plan Discussed with:   Anesthesia Plan Comments:         Anesthesia Quick Evaluation

## 2018-04-20 NOTE — Anesthesia Procedure Notes (Signed)
Epidural Patient location during procedure: OB Start time: 04/20/2018 2:57 AM End time: 04/20/2018 3:15 AM  Staffing Anesthesiologist: Lynda Rainwater, MD Performed: anesthesiologist   Preanesthetic Checklist Completed: patient identified, site marked, surgical consent, pre-op evaluation, timeout performed, IV checked, risks and benefits discussed and monitors and equipment checked  Epidural Patient position: sitting Prep: ChloraPrep Patient monitoring: heart rate, cardiac monitor, continuous pulse ox and blood pressure Approach: midline Location: L2-L3 Injection technique: LOR saline  Needle:  Needle type: Tuohy  Needle gauge: 17 G Needle length: 9 cm Needle insertion depth: 4 cm Catheter type: closed end flexible Catheter size: 20 Guage Catheter at skin depth: 8 cm Test dose: negative  Assessment Events: blood not aspirated, injection not painful, no injection resistance, negative IV test and no paresthesia  Additional Notes Reason for block:procedure for pain

## 2018-04-20 NOTE — Anesthesia Pain Management Evaluation Note (Signed)
2 CRNA Pain Management Visit Note  Patient: Andrea Barry, 32 y.o., female  "Hello I am a member of the anesthesia team at [facility]. We have an anesthesia team available at all times to provide care throughout the hospital, including epidural management and anesthesia for C-section. I don't know your plan for the delivery whether it a natural birth, water birth, IV sedation, nitrous supplementation, doula or epidural, but we want to meet your pain goals."   1.Was your pain managed to your expectations on prior hospitalizations?   Yes   2.What is your expectation for pain management during this hospitalization?     Epidural  3.How can we help you reach that goal? **support*  Record the patient's initial score and the patient's pain goal.   Pain: 5  Pain Goal: 7 [facility] wants you to be able to say your pain was always managed very well.  Sandrea Matte 04/20/2018

## 2018-04-20 NOTE — Progress Notes (Signed)
LABOR PROGRESS NOTE  Andrea Barry is a 32 y.o. G4P0120 at [redacted]w[redacted]d  admitted for SOL.   Subjective: Comfortable with epidural in place.   Objective: BP 116/70   Pulse 66   Temp 98.2 F (36.8 C) (Oral)   Resp 18   Ht 5\' 5"  (1.651 m)   Wt 74.8 kg   LMP 07/18/2017 (Approximate)   BMI 27.46 kg/m  or  Vitals:   04/20/18 0630 04/20/18 0739 04/20/18 0800 04/20/18 0831  BP: 109/68 116/68 118/65 116/70  Pulse: 69 64 70 66  Resp:  16 16 18   Temp:   98.2 F (36.8 C)   TempSrc:   Oral   Weight:      Height:        Dilation: 8.5 Effacement (%): 100 Cervical Position: Middle Station: -1 Presentation: Vertex Exam by:: Carleene Overlie RNC  FHT: baseline rate 140, moderate varibility, +acel, no decel Toco: q2-3   Labs: Lab Results  Component Value Date   WBC 10.1 04/20/2018   HGB 11.7 (L) 04/20/2018   HCT 36.9 04/20/2018   MCV 89.8 04/20/2018   PLT 217 04/20/2018    Patient Active Problem List   Diagnosis Date Noted  . Uterine contractions during pregnancy 04/20/2018  . Language barrier 03/29/2018  . Short cervix with cervical cerclage in third trimester, antepartum 12/13/2017  . History of preterm delivery at 21 weeks, currently pregnant 12/07/2017  . Cervical incompetence, antepartum, third trimester 10/13/2017  . Pregnant 09/07/2017  . Hypothyroidism 03/29/2016  . Atypical squamous cells of undetermined significance (ASCUS) on Papanicolaou smear of cervix 03/15/2016    Assessment / Plan: 32 y.o. G4P0120 at [redacted]w[redacted]d here for SOL.   Labor: Progressing.  Fetal Wellbeing:  Cat I.  Pain Control:  Epidural in place.  Anticipated MOD:  NSVD   Phill Myron, D.O. OB Fellow  04/20/2018, 9:50 AM

## 2018-04-20 NOTE — H&P (Signed)
Andrea Barry is a 32 y.o. female presenting for painful uterine contractions . This note is a late entry due to computers being down since 0100   OB History    Gravida  4   Para  1   Term  0   Preterm  1   AB  2   Living  0     SAB  2   TAB  0   Ectopic  0   Multiple  0   Live Births  0          Past Medical History:  Diagnosis Date  . Allergy   . Anemia   . Cervical incompetence    2nd trimester fetal loss  . Cervical incompetence in pregnancy, second trimester 10/13/2017   21 week loss 07/2016  . Hypothyroidism    Last TSH (04/2017) was normal, off levothyroxine for months   Past Surgical History:  Procedure Laterality Date  . CERVICAL CERCLAGE N/A 12/15/2017   Procedure: CERCLAGE CERVICAL;  Surgeon: Osborne Oman, MD;  Location: [facility];  Service: Gynecology;  Laterality: N/A;  . NO PAST SURGERIES     Family History: family history includes Diabetes in her mother; Hypertension in her mother and paternal grandmother; Mental illness in her maternal grandmother. Social History:  reports that she has never smoked. She has never used smokeless tobacco. She reports that she does not drink alcohol or use drugs.     Maternal Diabetes: No Genetic Screening: Normal Maternal Ultrasounds/Referrals: Normal Fetal Ultrasounds or other Referrals:  None Maternal Substance Abuse:  No Significant Maternal Medications:  None Significant Maternal Lab Results:  Lab values include: Group B Strep negative Other Comments:  None  Review of Systems  Constitutional: Negative for chills and fever.  Gastrointestinal: Positive for abdominal pain. Negative for constipation, diarrhea, nausea and vomiting.  Neurological: Negative for headaches.   Maternal Medical History:  Reason for admission: Contractions.  Nausea.  Contractions: Onset was 1-2 hours ago.   Perceived severity is strong.    Fetal activity: Perceived fetal activity is normal.   Last perceived fetal  movement was within the past hour.    Prenatal complications: No bleeding, PIH or placental abnormality.   Prenatal Complications - Diabetes: none.    Dilation: 5.5 Effacement (%): 70 Station: -2 Exam by:: [email or web address]-[email or web address], rnc  Blood pressure 131/85, pulse 89, temperature 98 F (36.7 C), temperature source Oral, resp. rate 20, last menstrual period 07/18/2017. Maternal Exam:  Uterine Assessment: Contraction strength is firm.  Contraction frequency is regular.   Abdomen: Patient reports no abdominal tenderness. Fetal presentation: vertex  Introitus: Normal vulva. Normal vagina.  Nitrazine test: not done. Amniotic fluid character: not assessed.  Pelvis: adequate for delivery.   Cervix: Cervix evaluated by digital exam.     Fetal Exam Fetal Monitor Review: Mode: ultrasound.   Baseline rate: 140.  Variability: moderate (6-25 bpm).   Pattern: accelerations present and no decelerations.    Fetal State Assessment: Category I - tracings are normal.     Physical Exam  Constitutional: She is oriented to person, place, and time. She appears well-developed and well-nourished.  HENT:  Head: Normocephalic.  Cardiovascular: Normal rate and regular rhythm.  Respiratory: Effort normal. No respiratory distress.  GI: Soft. She exhibits no distension. There is no tenderness. There is no rebound and no guarding.  Genitourinary:  Genitourinary Comments: Cervix 5-6cm/90/-1  Musculoskeletal: Normal range of motion.  Neurological: She is alert and oriented to person, place, and time.  Skin: Skin is warm and dry.  Psychiatric: She has a normal mood and affect.    Prenatal labs: ABO, Rh: A/Positive/-- (02/07 0000) Antibody: Negative (02/07 0000) Rubella: Immune (02/07 0000) RPR: Non Reactive (06/19 0827)  HBsAg: Negative (02/07 0000)  HIV: Non Reactive (06/19 0827)  GBS:     Assessment/Plan: [facility] at [redacted]w[redacted]d Active Labor  Admit to [facility] Routine orders   Hansel Feinstein 04/20/2018, 6:07 AM

## 2018-04-20 NOTE — Anesthesia Postprocedure Evaluation (Signed)
Anesthesia Post Note  Patient: Andrea Barry  Procedure(s) Performed: AN AD [place]     Patient location during evaluation: Mother Baby Anesthesia Type: Epidural Level of consciousness: awake and alert Pain management: pain level controlled Vital Signs Assessment: post-procedure vital signs reviewed and stable Respiratory status: spontaneous breathing, nonlabored ventilation and respiratory function stable Cardiovascular status: stable Postop Assessment: no headache, no backache and epidural receding Anesthetic complications: no    Last Vitals:  Vitals:   04/20/18 1402 04/20/18 1530  BP: 111/71 106/69  Pulse: 76 63  Resp: 18 18  Temp: 37 C 37 C    Last Pain:  Vitals:   04/20/18 1530  TempSrc: Oral  PainSc:    Pain Goal: Patients Stated Pain Goal: 2 (04/20/18 1031)               Sontee Desena

## 2018-04-20 NOTE — MAU Note (Signed)
Contractions since 10pm, was 5cm in office

## 2018-04-21 LAB — CBC
HEMATOCRIT: 29.5 % — AB (ref 36.0–46.0)
HEMOGLOBIN: 9.4 g/dL — AB (ref 12.0–15.0)
MCH: 28.1 pg (ref 26.0–34.0)
MCHC: 31.9 g/dL (ref 30.0–36.0)
MCV: 88.1 fL (ref 78.0–100.0)
Platelets: 187 10*3/uL (ref 150–400)
RBC: 3.35 MIL/uL — ABNORMAL LOW (ref 3.87–5.11)
RDW: 15.2 % (ref 11.5–15.5)
WBC: 13.3 10*3/uL — AB (ref 4.0–10.5)

## 2018-04-21 MED ORDER — IBUPROFEN 600 MG PO TABS: 600.0000 mg | ORAL_TABLET | Freq: Four times a day (QID) | ORAL | 0 refills | Status: DC | PRN

## 2018-04-21 NOTE — Discharge Instructions (Signed)
Depresin posparto y baby blues (Postpartum Depression and Baby Blues) El perodo del posparto comienza inmediatamente despus del nacimiento de un beb y suele ser una poca de gran felicidad y mucho entusiasmo. Tambin es tiempo de muchos cambios en la vida de [place]. Sin importar cuntos hijos tenga una madre, cada nio plantea nuevos desafos y Ardelia Mems nueva dinmica a la familia. Es frecuente que haya sentimientos de entusiasmo junto con cambios confusos en el estado de nimo, las emociones y los pensamientos. Todas las madres corren riesgo de tener depresin posparto o "baby blues". Estos cambios en el estado de nimo pueden presentarse inmediatamente despus del parto o muchos meses despus del nacimiento de un nio. El baby blues o la depresin posparto pueden ser leves o graves. Adems, la depresin posparto puede desaparecer con bastante rapidez o puede ser una enfermedad de larga evolucin. CAUSAS Se cree que el aumento de las concentraciones hormonales y su rpida disminucin es la causa principal de la depresin posparto y el baby blues. Durante y despus del [place], un nmero de hormonas [place]. El estrgeno y la progesterona generalmente disminuyen inmediatamente despus del parto. Tambin disminuyen con [place] y de varios esteroides del cortisol. Otros factores que juegan un papel en estos cambios anmicos incluyen los sucesos importantes de la vida y Advertising account planner. FACTORES DE RIESGO Si tiene alguno de los siguientes riesgos de desarrollar baby blues o depresin posparto, sepa a qu sntomas debe estar atenta durante el perodo del posparto. Los factores de riesgo que pueden aumentar la probabilidad de Best boy baby blues o depresin posparto incluyen lo siguiente:  Antecedentes personales o familiares de depresin.  Depresin Solicitor.  Problemas anmicos premenstruales o que guardan relacin con los anticonceptivos orales.  Mucho  estrs en la vida.  Conflictos maritales.  Falta de una red de apoyo social.  Raynelle Jan un beb con necesidades especiales.  Problemas de salud, como diabetes. Lorena sntomas de baby blues incluyen lo siguiente:  Cambios en el estado de nimo durante lapsos breves, como pasar de la felicidad extrema a la tristeza.  Falta de concentracin.  Dificultad para dormir.  Ataques de llanto, sensibilidad emocional.  Irritabilidad.  Ansiedad. Generalmente, los sntomas de depresin posparto comienzan en el trmino del primer mes despus del parto. Estos sntomas incluyen:  Dificultad para dormir o somnolencia excesiva.  Prdida de peso notable.  Agitacin.  Sentimientos de inutilidad.  Falta de inters en las actividades o la comida. La psicosis posparto es una enfermedad muy grave que puede ser peligrosa. Afortunadamente, es poco frecuente. Si aparece alguno de los siguientes sntomas, se debe recibir atencin mdica de inmediato. Los sntomas de psicosis posparto incluyen lo siguiente:  Alucinaciones y delirios.  Comportamiento atpico o desorganizado.  Confusin o desorientacin. DIAGNSTICO El diagnstico se realiza mediante la evaluacin de los sntomas. No hay exmenes mdicos ni pruebas de laboratorio que permitan hacer un diagnstico, pero hay varios cuestionarios que el mdico puede usar para identificar a las personas que tienen baby blues, depresin posparto o psicosis. A menudo se [place] una herramienta de deteccin sistemtica llamada [place] de depresin posnatal de [place] para diagnosticar la depresin durante el perodo del posparto. TRATAMIENTO Generalmente, el baby blues desaparece solo en el trmino de 1 o 2semanas. A menudo, todo lo que se necesita es apoyo social. Se le recomendar que duerma y descanse lo suficiente. En ocasiones, se le pueden administrar medicamentos para ayudarla a dormir. La depresin posparto requiere [employer]  porque puede  durar varios meses o ms tiempo si no se la trata. El tratamiento puede incluir terapia individual o grupal, medicamentos o ambos para abordar los factores [place], fisiolgicos y psicolgicos que pueden tener influencia en la depresin. Tambin pueden recomendarse enfticamente el ejercicio regular, una alimentacin sana, el descanso y el 49 social. La psicosis posparto es una enfermedad ms grave y requiere tratamiento inmediato. A menudo es necesaria la hospitalizacin. INSTRUCCIONES PARA EL CUIDADO EN EL HOGAR  Descanse todo lo posible. Tome una siesta cuando el beb duerme.  Haga ejercicios regularmente. Para algunas mujeres, el yoga y las caminatas son beneficiosas.  Consuma una dieta equilibrada y nutritiva.  Haga pequeas cosas que disfruta. Tome una taza de t, dese un bao de burbujas, lea su revista favorita o escuche su msica predilecta.  Evite el alcohol.  Pida ayuda con los [employer], la cocina, las compras de comida o las obligaciones diarias si lo necesita. No intente hacer todo.  Hable con personas allegadas sobre cmo se siente. Busque el apoyo de su pareja, sus familiares, sus amigos o de [employer] primerizas.  Intente pensar positivamente. Piense en aquellas cosas por las que se siente agradecida.  No pase mucho tiempo sola.  Tome solo medicamentos de venta libre o recetados, segn las indicaciones del mdico.  Cumpla con todos los controles del postparto.  Infrmele a su mdico sobre cualquier inquietud que tenga.  SOLICITE ATENCIN MDICA SI: Wilma Flavin reaccin al medicamento o problemas con este. SOLICITE ATENCIN MDICA DE INMEDIATO SI:  Tiene sentimientos suicidas.  Cree que podra lastimar al beb o a Nurse, children's.  ASEGRESE DE QUE:  Comprende estas instrucciones.  Controlar su afeccin.  Recibir ayuda de inmediato si no mejora o si empeora.  Esta informacin no tiene Marine scientist el consejo del mdico. Asegrese de  hacerle al mdico cualquier pregunta que tenga. Document Released: 01/12/2008 Document Revised: 07/31/2013 Document Reviewed: 05/07/2013 Elsevier Interactive Patient Education  2017 [place] vaginal, cuidados posteriores Vaginal Delivery, Care After Siga estas instrucciones durante las prximas semanas. Estas indicaciones le proporcionan informacin acerca de cmo deber cuidarse despus del parto vaginal. Su mdico tambin podr darle indicaciones ms especficas. El tratamiento ha sido planificado segn las prcticas mdicas actuales, pero en algunos casos pueden ocurrir problemas. Llame al mdico si tiene problemas o preguntas. Qu puedo esperar despus del procedimiento? Despus de un parto vaginal, es frecuente tener lo siguiente:  Hemorragia leve de la vagina.  Dolor en el abdomen, la vagina y la zona de la piel entre la abertura vaginal y el ano (perineo).  Calambres plvicos.  Fatiga.  Siga estas indicaciones en su casa: Medicamentos  Tome los medicamentos de venta libre y los recetados solamente como se lo haya indicado el mdico.  Si le recetaron un antibitico, tmelo como se lo haya indicado el mdico. No interrumpa la administracin del antibitico hasta que lo haya terminado. Conducir   No conduzca ni opere maquinaria pesada mientras toma analgsicos recetados.  No conduzca durante 24horas si le administraron un sedante. Estilo de vida  No beba alcohol. Esto es de suma importancia si est amamantando o toma analgsicos.  No consuma productos que contengan tabaco, incluidos cigarrillos, tabaco de Higher education careers adviser o cigarrillos electrnicos. Si necesita ayuda para dejar de fumar, consulte al mdico. Qu debe comer y beber  Beba al menos 8vasos de ochoonzas (240cc) de agua todos los das a menos que el mdico le indique lo contrario. Si elige amamantar al beb,  quiz deba beber an ms cantidad de agua.  Coma alimentos ricos en [employer]. Estos  alimentos pueden ayudarla a prevenir o Cytogeneticist. Los alimentos ricos en fibras incluyen, entre otros: ? Panes y cereales integrales. ? Arroz integral. ? Optometrist. ? Lambert Mody y verduras frescas. Actividad  Retome sus actividades normales como se lo haya indicado el mdico. Pregntele al mdico qu actividades son seguras para usted.  Descanse todo lo que pueda. Trate de descansar o tomar una siesta mientras el beb est durmiendo.  No levante objetos que pesen ms que su beb o 10libras (4,5kg) hasta que el mdico le diga que es seguro.  Hable con el mdico sobre cundo puede retomar la actividad sexual. Esto puede depender de lo siguiente: ? Riesgo de sufrir una infeccin. ? Velocidad de cicatrizacin. ? Comodidad y deseo de Statistician sexual. Cuidados vaginales  Si le realizaron una episiotoma o tuvo un desgarro vaginal, contrlese la zona [employer] para detectar signos de infeccin. Est atenta a los siguientes signos: ? Aumento del enrojecimiento, la hinchazn o Conservation officer, historic buildings. ? Mayor presencia de lquido o [place]. ? Calor. ? Pus o mal olor.  No use tampones ni se haga duchas vaginales hasta que el mdico la autorice.  Controle la sangre que elimina por la vagina para detectar cogulos de [place] [address]. Estos pueden tener el aspecto de grumos de color rojo oscuro, o secrecin marrn o negra. Instrucciones generales  Mantenga el perineo limpio y seco, como se lo haya indicado el mdico.  Use ropa cmoda y suelta.  Cuando vaya al bao, siempre higiencese de adelante [employer].  Pregntele al mdico si puede ducharse o tomar baos de inmersin. Si se le realiz una episiotoma o tuvo un desgarro perineal durante el trabajo del parto o el parto, es posible que el mdico le indique que no tome baos de inmersin durante un determinado tiempo.  Use un sostn que sujete y ajuste bien sus pechos.  Si es posible, pdale a alguien que la ayude con las tareas  del hogar y a Air cabin crew del beb durante al menos [employer] despus de que le den el alta del hospital.  Dallie Dad a todas las visitas de seguimiento para usted y el beb, como se lo haya indicado el mdico. Esto es importante. Comunquese con un mdico si:  Tiene los siguientes sntomas: ? Secrecin vaginal que tiene mal olor. ? Dificultad para orinar. ? Dolor al [employer]. ? Aumento o disminucin repentinos de la frecuencia de las deposiciones. ? Ms enrojecimiento, hinchazn o dolor alrededor de la episiotoma o del desgarro vaginal. ? Ms secrecin de lquido o sangre de la episiotoma o del desgarro vaginal. ? Pus o mal olor proveniente de la episiotoma o del desgarro vaginal. ? Cristy Hilts. ? Erupcin cutnea. ? Poco inters o falta de inters en actividades que solan gustarle. ? Dudas sobre su cuidado y el del beb.  Siente la episiotoma o el desgarro vaginal caliente al tacto.  La episiotoma o el desgarro vaginal se abren o no Sales executive.  Siente dolor en las [place], o estn duras o enrojecidas.  Siente tristeza o preocupacin de forma inusual.  Siente nuseas o vomita.  Elimina cogulos de sangre grandes por la vagina. Si expulsa un cogulo de sangre por la vagina, gurdelo para mostrrselo a su mdico. No tire la cadena sin que el mdico examine el cogulo de sangre antes.  Orina ms de lo habitual.  Se siente mareada o se desmaya.  No ha amamantado para nada y no ha tenido un perodo menstrual durante 12 semanas despus del [place].  Dej de amamantar al beb y no ha tenido su perodo menstrual durante 12 semanas despus de dejar de Economist. Solicite ayuda de inmediato si:  Tiene los siguientes sntomas: ? Dolor que no desaparece o no mejora con medicamentos. ? Tourist information centre manager. ? Dificultad para respirar. ? Visin borrosa o Geologist, engineering. ? Pensamientos de autolesionarse o lesionar al beb.  Comienza a Public affairs consultant abdomen o en una de las  piernas.  Presenta un dolor de cabeza intenso.  Se desmaya.  Tiene una hemorragia de la vagina tan intensa que empapa dos toallitas sanitarias en [place]. Esta informacin no tiene Marine scientist el consejo del mdico. Asegrese de hacerle al mdico cualquier pregunta que tenga. Document Released: 07/26/2005 Document Revised: 11/17/2016 Document Reviewed: 08/10/2015 Elsevier Interactive Patient Education  [employer].

## 2018-04-21 NOTE — Progress Notes (Signed)
Patient has declined Interpreting services. [place] Interpreter.

## 2018-04-21 NOTE — Progress Notes (Deleted)
Patient's husband is bringing her food. [place] Interpreter.

## 2018-04-21 NOTE — Discharge Summary (Signed)
Postpartum Discharge Summary     Patient Name: Andrea Barry DOB: 06-12-1986 MRN: [id number]  Date of admission: 04/20/2018 Delivering Provider: Matilde Haymaker   Date of discharge: 04/21/2018  Admitting diagnosis: 39 weeks ctx Intrauterine pregnancy: [redacted]w[redacted]d     Secondary diagnosis:  Active Problems:   Cervical incompetence, antepartum, third trimester   History of preterm delivery at 21 weeks, currently pregnant   Short cervix with cervical cerclage in third trimester, antepartum   Hypothyroidism   Language barrier   Uterine contractions during pregnancy   Atypical squamous cells of undetermined significance (ASCUS) on Papanicolaou smear of cervix  Additional problems: none     Discharge diagnosis: Term Pregnancy Delivered                                                                                                Post partum procedures:none  Augmentation: none  Complications: None  Hospital course:  Onset of Labor With Vaginal Delivery     32 y.o. yo [id number] at [redacted]w[redacted]d was admitted in Latent Labor on 04/20/2018. Patient had an uncomplicated labor course as follows:  Membrane Rupture Time/Date: 11:18 AM ,04/20/2018   Intrapartum Procedures: Episiotomy: None [1]                                         Lacerations:  1st degree [2];Sulcus [9]  Patient had a delivery of a Viable infant. 04/20/2018  Information for the patient's newborn:  Mikailah, Morel [[id number]]  Delivery Method: Vaginal, Spontaneous(Filed from Delivery Summary)    Pateint had an uncomplicated postpartum course. She had a score of 14 on the [place] assessment but was unable to have a SW consult prior to d/c. Msg sent to [facility], social worker at [facility] to f/u on this. She is ambulating, tolerating a regular diet, passing flatus, and urinating well. Patient is discharged home in stable condition on 04/21/18, per her request for early d/c.   Magnesium Sulfate recieved: No BMZ received:  No  Physical exam  Vitals:   04/20/18 2313 04/21/18 0228 04/21/18 0512 04/21/18 1512  BP: 118/79 116/66 107/74 99/65  Pulse: 80 90 97 82  Resp: 16 17 16 16   Temp: (!) 97.5 F (36.4 C) 98.1 F (36.7 C) 98 F (36.7 C) 98 F (36.7 C)  TempSrc: Oral Oral Oral   SpO2: 100% 99%  99%  Weight:      Height:       General: alert and cooperative Lochia: appropriate Uterine Fundus: firm Incision: N/A DVT Evaluation: No evidence of DVT seen on physical exam. Labs: Lab Results  Component Value Date   WBC 13.3 (H) 04/21/2018   HGB 9.4 (L) 04/21/2018   HCT 29.5 (L) 04/21/2018   MCV 88.1 04/21/2018   PLT 187 04/21/2018   CMP Latest Ref Rng & Units 04/13/2017  Glucose 65 - 99 mg/dL 84  BUN 6 - 20 mg/dL 7  Creatinine 0.57 - 1.00 mg/dL 0.63  Sodium 134 - 144 mmol/L 138  Potassium  3.5 - 5.2 mmol/L 4.7  Chloride 96 - 106 mmol/L 104  CO2 20 - 29 mmol/L 24  Calcium 8.7 - 10.2 mg/dL 9.3  Total Protein 6.0 - 8.5 g/dL 7.7  Total Bilirubin 0.0 - 1.2 mg/dL 0.5  Alkaline Phos 39 - 117 IU/L 39  AST 0 - 40 IU/L 22  ALT 0 - 32 IU/L 11    Discharge instruction: per After Visit Summary and "Baby and Me Booklet".  After visit meds:  Allergies as of 04/21/2018      Reactions   Metronidazole Other (See Comments)   Dizziness, taste of iron in mouth, and darkened urine      Medication List    STOP taking these medications   CLARITIN 10 MG tablet Generic drug:  loratadine   docusate sodium 100 MG capsule Commonly known as:  COLACE   traMADol 50 MG tablet Commonly known as:  ULTRAM     TAKE these medications   ferrous sulfate 325 (65 FE) MG tablet Take 1 tablet (325 mg total) by mouth daily with breakfast.   ibuprofen 600 MG tablet Commonly known as:  ADVIL,MOTRIN Take 1 tablet (600 mg total) by mouth every 6 (six) hours as needed.   prenatal vitamin w/FE, FA 27-1 MG Tabs tablet Take 1 tablet by mouth daily at 12 noon.   VITAMIN C GUMMIE PO Take 1 each by mouth daily.        Diet: routine diet  Activity: Advance as tolerated. Pelvic rest for 6 weeks.   Outpatient follow UG:QBVQ have Brielle check in 1-2 wks, then 4 wk PP visit; needs f/u on TSH at her PP visit Follow up Appt: Future Appointments  Date Time Provider [place]  05/26/2018 11:55 AM Lavonia Drafts, MD [place]   Follow up Visit:No follow-ups on file.   Newborn Data: Live born female  Birth Weight: 7 lb (3175 g) APGAR: 53, 46  Newborn Delivery   Birth date/time:  04/20/2018 11:32:00 Delivery type:  Vaginal, Spontaneous     Baby Feeding: Breast Disposition:home with mother   04/21/2018 Serita Grammes, CNM  5:25 PM

## 2018-04-21 NOTE — Lactation Note (Addendum)
This note was copied from a baby's chart. Lactation Consultation Note  Patient Name: Andrea Barry [id number] Date: 04/21/2018 Reason for consult: Follow-up assessment;1st time breastfeeding  Mom reports that her breasts feel heavier today.  Hand expression was taught to Mom & she was able to express colostrum with ease. Breasts are filling. I anticipate that Mom will have an abundant milk supply. Spoon-feeding was discussed if Mom ever felt that infant needed more intake.   Mom's L nipple was rounded after infant released latch. Mom's L nipple, which had been previously documented as inverted, is now everted at rest.  Mom was provided a size 27 flange for her R breast. Mom had been provided a hand pump from nurse. Mom was also shown how to disassemble pump parts for cleaning. Mom understands that she does not have to pump, but we are providing the pump in case she will find it useful.   Parents were taught sounds of swallows.   Matthias Hughs [facility] 04/21/2018, 4:08 PM

## 2018-04-21 NOTE — Progress Notes (Addendum)
CSW acknowledges consult.  CSW attempted to meet with MOB, however MOB and infant was being attended to bey bedside nurse. Weekend CSW will complete assessment.    Laurey Arrow, MSW, LCSW Clinical Social Work [phone]

## 2018-04-21 NOTE — Progress Notes (Signed)
Post Partum Day #1 Subjective: Pt is a 32 yo [id number] [redacted]w[redacted]d s/p SVD, PPH w/ EBL 66mL. Doing well today, no complaints. Reports only mild abdominal pain and normal lochia with mild vaginal bleeding, both much improved from yesterday. Tolerating PO intake, ambulating well, urinating normally, +flatus. Denies any nausea, vomiting, HA, dizziness, blurred vision, or RUQ pain. She is breastfeeding. Requests Nexplanon for contraception.   Objective: Blood pressure 107/74, pulse 97, temperature 98 F (36.7 C), temperature source Oral, resp. rate 16, height 5\' 5"  (1.651 m), weight 74.8 kg, last menstrual period 07/18/2017, SpO2 99 %, unknown if currently breastfeeding.  Physical Exam:  General: alert, cooperative, appears stated age and no distress Lochia: appropriate Uterine Fundus: firm Incision: N/A DVT Evaluation: No evidence of DVT seen on physical exam.  Recent Labs    04/20/18 0159 04/21/18 0539  HGB 11.7* 9.4*  HCT 36.9 29.5*    Assessment/Plan: Discharge home, Breastfeeding. F/u OP for Nexplanon placement for contraception.    LOS: 1 day   [facility] 04/21/2018, 9:19 AM

## 2018-04-28 ENCOUNTER — Encounter: Payer: Self-pay | Admitting: Obstetrics & Gynecology

## 2018-05-08 ENCOUNTER — Ambulatory Visit (INDEPENDENT_AMBULATORY_CARE_PROVIDER_SITE_OTHER): Payer: Medicaid Other | Admitting: Clinical

## 2018-05-08 DIAGNOSIS — F4323 Adjustment disorder with mixed anxiety and depressed mood: Secondary | ICD-10-CM

## 2018-05-08 NOTE — BH Specialist Note (Addendum)
Integrated Behavioral Health Initial Visit  MRN: [id number] Name: Andrea Barry  Number of [place] Clinician visits:: 1/6 Session Start time: 10:06  Session End time: 10:32 Total time: 20 minutes  Type of Service: [place] Interpretor:Yes.   Interpretor Name and Language: Spanish    Warm Hand Off Completed.       SUBJECTIVE: Andrea Barry is a 32 y.o. female accompanied by newborn daughter Patient was referred by Serita Grammes, CNM for positive [place] screening prior to discharge from hospital. Patient reports the following symptoms/concerns: Pt states she is feeling much better since being discharged from the hospital, feels she is adjusting well postpartum, sleeping when baby sleeps. Pt feels some fatigue, stopped taking iron pills when she ran out, and worries about blood clots postpartum. Duration of problem: Postpartum; Severity of problem: mild  OBJECTIVE: Mood: Normal and Affect: Appropriate Risk of harm to self or others: No plan to harm self or others  LIFE CONTEXT: Family and Social: Family and friends supportive School/Work: - Self-Care: - Life Changes: Recent childbirth  GOALS ADDRESSED: Patient will: 1. Reduce symptoms of: anxiety and depression 2. Increase knowledge and/or ability of: healthy habits  3. Demonstrate ability to: Increase healthy adjustment to current life circumstances  INTERVENTIONS: Interventions utilized: Psychoeducation and/or Health Education  Standardized Assessments completed: GAD-7 and PHQ 9  ASSESSMENT: Patient currently experiencing Adjustment disorder with mixed anxious and depressed mood   Patient may benefit from psychoeducation and brief therapeutic interventions regarding coping with symptoms of depression and anxiety .  PLAN: 1. Follow up with behavioral health clinician on : As needed 2. Behavioral recommendations:  -Talk to nurse today, about blood  clots -Continue sleeping when baby sleeps -Continue taking iron daily, as recommended by medical provider(pick  Up OTC today) -Read educational materials regarding coping with symptoms of depression and anxiety -Consider options for ongoing therapy, as needed in the future 3. Referral(s): [place] (In Clinic) 4. "From scale of 1-10, how likely are you to follow plan?": 10  [name], LCSW  Depression screen [facility] 2/9 05/08/2018 04/19/2018 04/12/2018 04/05/2018 01/25/2018  Decreased Interest 1 1 1 1 1   Down, Depressed, Hopeless 1 1 1 3 1   PHQ - 2 Score 2 2 2 4 2   Altered sleeping 1 1 3 1 1   Tired, decreased energy 1 1 3 1 1   Change in appetite 1 1 2 1 1   Feeling bad or failure about yourself  0 1 1 1 1   Trouble concentrating 1 1 2 1 1   Moving slowly or fidgety/restless 0 1 1 1 1   Suicidal thoughts 0 0 0 0 1  PHQ-9 Score 6 8 14 10 9   Some recent data might be hidden   GAD 7 : Generalized Anxiety Score 05/08/2018 04/19/2018 04/12/2018 04/05/2018  Nervous, Anxious, on Edge 1 1 1 1   Control/stop worrying 1 1 2 1   Worry too much - different things 1 1 1 1   Trouble relaxing 1 1 2 1   Restless 1 1 1 1   Easily annoyed or irritable 2 2 3 1   Afraid - awful might happen 1 1 1 1   Total GAD 7 Score 8 8 11  7

## 2018-05-26 ENCOUNTER — Ambulatory Visit (INDEPENDENT_AMBULATORY_CARE_PROVIDER_SITE_OTHER): Payer: Medicaid Other | Admitting: Obstetrics & Gynecology

## 2018-05-26 ENCOUNTER — Encounter: Payer: Self-pay | Admitting: Obstetrics & Gynecology

## 2018-05-26 DIAGNOSIS — Z1389 Encounter for screening for other disorder: Secondary | ICD-10-CM | POA: Diagnosis not present

## 2018-05-26 DIAGNOSIS — Z3202 Encounter for pregnancy test, result negative: Secondary | ICD-10-CM

## 2018-05-26 DIAGNOSIS — Z30017 Encounter for initial prescription of implantable subdermal contraceptive: Secondary | ICD-10-CM

## 2018-05-26 LAB — POCT PREGNANCY, URINE: PREG TEST UR: NEGATIVE

## 2018-05-26 MED ORDER — ETONOGESTREL 68 MG ~~LOC~~ IMPL
68.0000 mg | DRUG_IMPLANT | Freq: Once | SUBCUTANEOUS | Status: AC
  Administered 2018-05-26: 68 mg via SUBCUTANEOUS

## 2018-05-26 NOTE — Progress Notes (Signed)
Subjective:     Andrea Barry is a 32 y.o. female who presents for a postpartum visit. She is 5 weeks postpartum following a spontaneous vaginal delivery. I have fully reviewed the prenatal and intrapartum course. The delivery was at 39.3 gestational weeks. Outcome: spontaneous vaginal delivery. Anesthesia: epidural. Postpartum course has been unremarkable. Baby's course has been unremarkable. Baby is feeding by breast. Bleeding no bleeding. Bowel function is normal. Bladder function is normal. Patient is not sexually active. Contraception method is none. Patient is interested in [place]. Postpartum depression screening: negative.  The following portions of the patient's history were reviewed and updated as appropriate: allergies, current medications, past family history, past medical history, past social history, past surgical history and problem list.  Review of Systems Pertinent items are noted in HPI.   Objective:  BP 114/71   Pulse 80   Ht 5\' 4"  (1.626 m)   Wt 140 lb (63.5 kg)   Breastfeeding? Yes   BMI 24.03 kg/m   CONSTITUTIONAL: Well-developed, well-nourished female in no acute distress.  HENT:  Normocephalic, atraumatic EYES: Conjunctivae and EOM are normal. No scleral icterus.  NECK: Normal range of motion SKIN: Skin is warm and dry. No rash noted. Not diaphoretic.No pallor. Justin: Alert and oriented to person, place, and time. Normal coordination.  GYN: perineum healing well. There is a knot at the introitus which is nontender. No abnormal discharge.   Patient given informed consent, she signed consent form. Pregnancy test was negative.  Appropriate time out taken.  Patient's left arm was prepped and draped in the usual sterile fashion.. The ruler used to measure and mark insertion area.  Patient was prepped with alcohol swab and then injected with 5 ml of 1 % lidocaine.  She was prepped with betadine, Nexplanon removed from packaging,  Device confirmed in needle, then  inserted full length of needle and withdrawn per handbook instructions.  There was minimal blood loss.  Patient insertion site covered with guaze and a pressure bandage to reduce any bruising.  The patient tolerated the procedure well and was given post procedure instructions. Return in about one month for Nexplanon check.   . Assessment:   5 weeks postpartum exam. Pap smear not done at today's visit.   Contraception counseling- Nexplanon inserted today  Plan:    1. Contraception: Nexplanon 2.  Follow up in: 1 year or sooner prn   Keith Cancio L. Harraway-Smith, M.D., Cherlynn June

## 2018-05-26 NOTE — Patient Instructions (Signed)
Etonogestrel implant Qu es este medicamento? El ETONOGESTREL es un dispositivo anticonceptivo (control de la natalidad). [place] para evitar los embarazos. Se puede usar hasta por 3 aos. Este medicamento puede ser utilizado para otros usos; si tiene alguna pregunta consulte con su proveedor de atencin mdica o con su farmacutico. MARCAS COMUNES: Implanon, Nexplanon Qu le debo informar a mi profesional de la salud antes de tomar este medicamento? Necesita saber si usted presenta alguno de los siguientes problemas o situaciones: sangrado vaginal anormal enfermedad vascular o cogulos sanguneos cncer de mama, cervical, heptico depresin diabetes enfermedad de la vescula biliar dolores de cabeza enfermedad cardiaca o ataque cardiaco reciente alta presin sangunea alto nivel de colesterol enfermedad renal enfermedad heptica convulsiones fuma tabaco una reaccin alrgica o inusual al etonogestrel, otras hormonas, anestsicos o antispticos, medicamentos, alimentos, colorantes o conservantes si est embarazada o buscando quedar embarazada si est amamantando a un beb Cmo debo [employer]? Un profesional de la salud inserta este dispositivo justo debajo de la piel en la parte interior del brazo. Hable con su pediatra para informarse acerca del uso de este medicamento en nios. Puede requerir atencin especial. Sobredosis: Pngase en contacto inmediatamente con un centro toxicolgico o una sala de urgencia si usted cree que haya tomado demasiado medicamento. ATENCIN: [employer] es solo para usted. No comparta este medicamento con nadie. Qu sucede si me olvido de una dosis? No se aplica en este caso. Qu puede interactuar con este medicamento? No tome este medicamento con ninguno de los siguientes frmacos: amprenavir bosentano fosamprenavir Este medicamento tambin podra interactuar con los siguientes medicamentos: barbitricos para inducir el sueo o para el  tratamiento de convulsiones ciertos medicamentos para infecciones micticas, tales como itraconazol y Photographer jugo de toronja griseofulvina medicamentos para tratar convulsiones, tales como [place], [place], [place], [place], topiramato modafinilo fenilbutazona rifampicina rufinamida algunos medicamentos para tratar la infeccin por el VIH, tales como atazanavir, indinavir, lopinavir, nelfinavir, tipranavir, ritonavir hierba de [place] Puede ser que esta lista no menciona todas las posibles interacciones. Informe a su profesional de [employer]	[place] de [employer] productos a base de hierbas, medicamentos de [place] o suplementos nutritivos que est tomando. Si usted fuma, consume bebidas alcohlicas o si utiliza drogas ilegales, indqueselo tambin a su profesional de [employer]	[place]. Algunas sustancias pueden interactuar con su medicamento. A qu debo estar atento al usar [employer]? Este producto no protege contra la infeccin por el VIH ([place]) u otras enfermedades de transmisin sexual. Usted debe sentir el implante al presionar con las yemas de los dedos sobre la piel donde se insert. Contacte a su mdico si no se siente el implante y [place] un mtodo anticonceptivo no hormonal (como el condn) hasta que el mdico confirma que el implante est en su Environmental consultant. Si siente que el implante puede haber roto o doblado en su brazo, pngase en contacto con su proveedor de atencin mdica. Qu efectos secundarios puedo tener al [employer] este medicamento? Efectos secundarios que debe informar a su mdico o a Barrister's clerk de la salud tan pronto como sea posible: Chief of Staff, como erupcin cutnea, picazn o urticarias, e hinchazn de la cara, los labios o la lengua bultos en las mamas cambios en las emociones o el estado de nimo estado de nimo deprimido sangrado menstrual intenso o Recruitment consultant, irritacin, hinchazn o Tour manager de la insercin Investment banker, corporate de la  insercin signos de infeccin en el sitio de insercin tales Marcell Anger, y enrojecimiento de  la piel, dolor o secrecin signos de Media planner signos y sntomas de un cogulo sanguneo, tales como problemas respiratorios; cambios en la visin; dolor en el pecho; dolor de cabeza severo, repentino; dolor, hinchazn, calor en la pierna; dificultad para hablar; entumecimiento o debilidad repentina de la cara, el brazo o la pierna signos y sntomas de lesin al hgado, como orina amarilla oscura o [place]; sensacin general de estar enfermo o sntomas gripales; heces claras; prdida de apetito; nuseas; dolor en la regin abdominal superior derecha; cansancio o debilidad inusual; color amarillento de los ojos o la piel sangrado vaginal inusual, secrecin signos y sntomas de un derrame cerebral, tales como cambios en la visin; confusin; dificultad para hablar o entender; dolores de cabeza severos; entumecimiento o debilidad repentina de la cara, el brazo o la pierna; problemas al Writer; Chief of Staff; prdida del equilibrio o la coordinacin Efectos secundarios que generalmente no requieren Geophysical data processor (infrmelos a su mdico o a Barrister's clerk de la salud si persisten o si son molestos): acn dolor de Futures trader en las mamas cambios de peso mareos sensacin general de estar enfermo o sntomas gripales dolor de cabeza sangrado menstrual irregular nuseas dolor de garganta irritacin o inflamacin vaginal Puede ser que esta lista no menciona todos los posibles efectos secundarios. Comunquese a su mdico por asesoramiento mdico [employer] Usted puede informar los efectos secundarios a la FDA por telfono al 1-800-FDA-1088. Dnde debo guardar mi medicina? Este medicamento se administra en hospitales o clnicas y no necesitar guardarlo en su domicilio. ATENCIN: Este folleto es un resumen. Puede ser que no cubra toda la posible informacin. Si usted tiene preguntas acerca de esta medicina,  consulte con su mdico, su farmacutico o su profesional de Technical sales engineer.  2018 Elsevier/Gold Standard (2016-08-26 00:00:00)

## 2018-05-30 ENCOUNTER — Encounter: Payer: Self-pay | Admitting: *Deleted

## 2018-11-29 IMAGING — US US MFM OB TRANSVAGINAL
1 series · 15 of 28 positions shown · non-contrast
Comparison: none

[Series 1: us mfm ob transvaginal · 33 acquisitions, 15 frames shown]
[im 1/33]
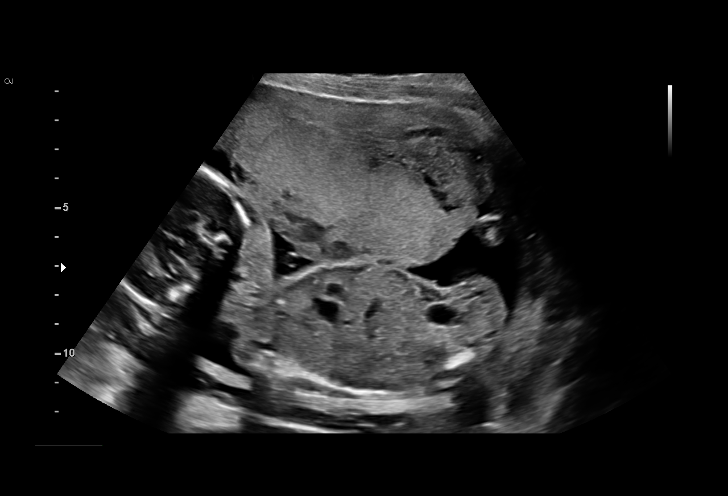
[im 3/33]
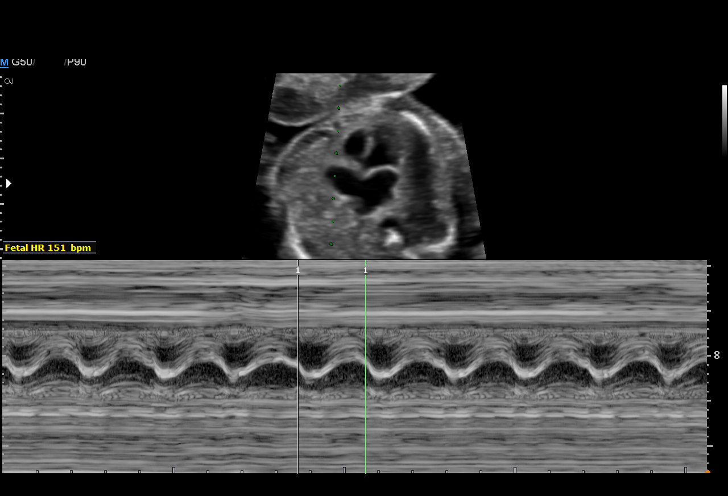
[im 5/33]
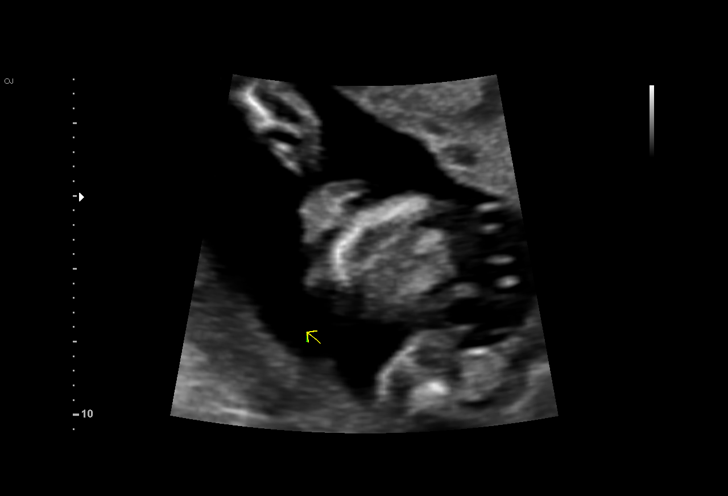
[im 8/33]
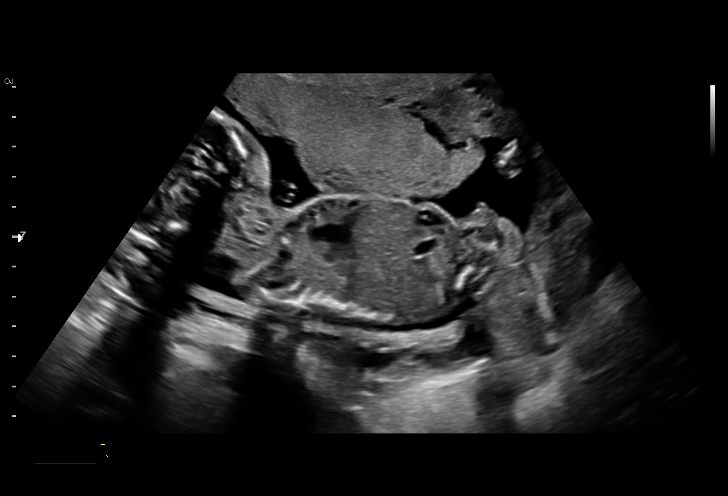
[im 10/33]
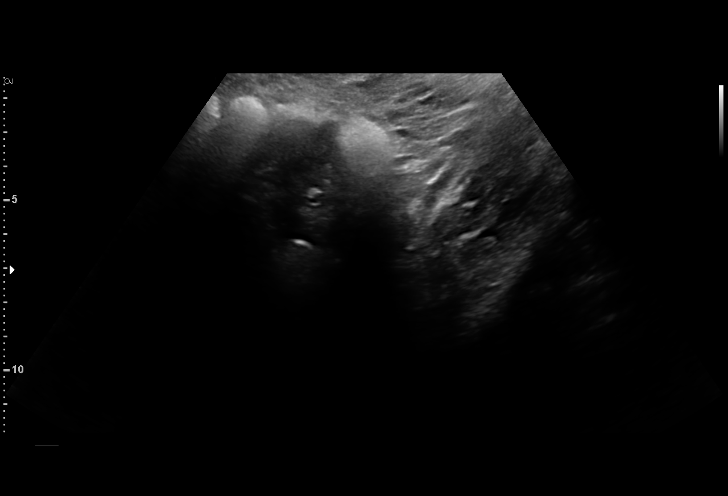
[im 12/33]
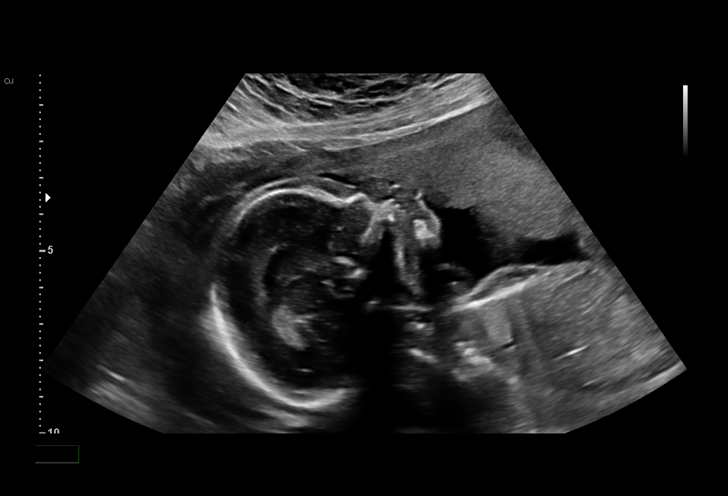
[im 15/33]
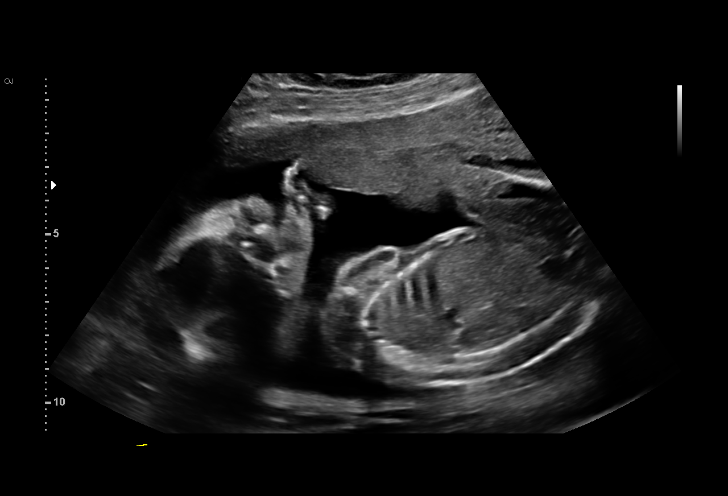
[im 17/33]
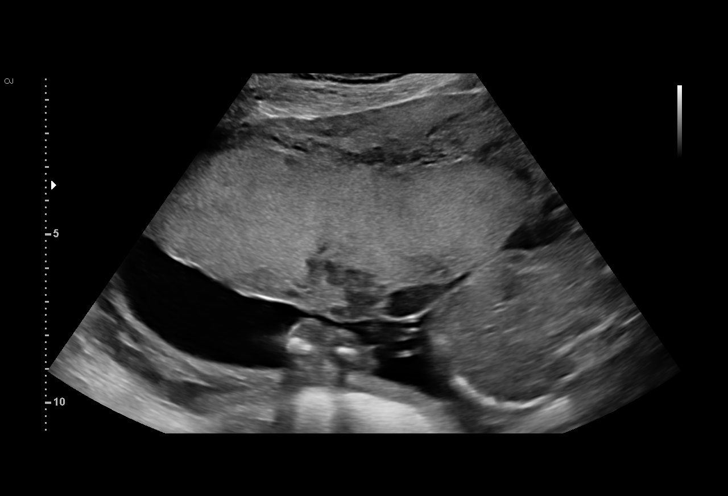
[im 18/33]
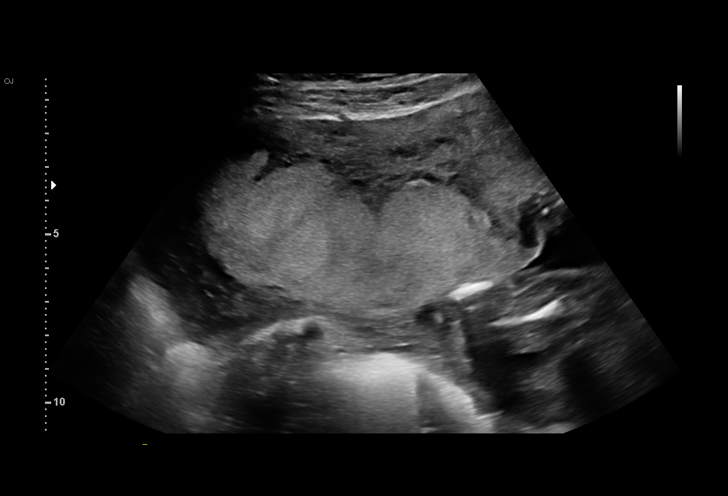
[im 21/33]
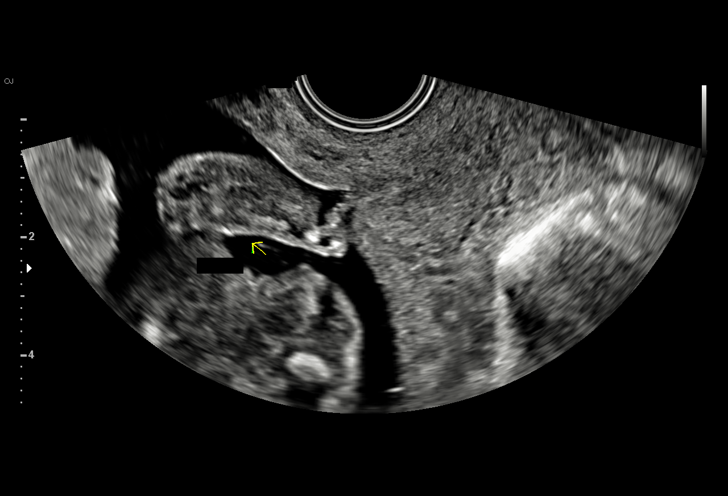
[im 23/33]
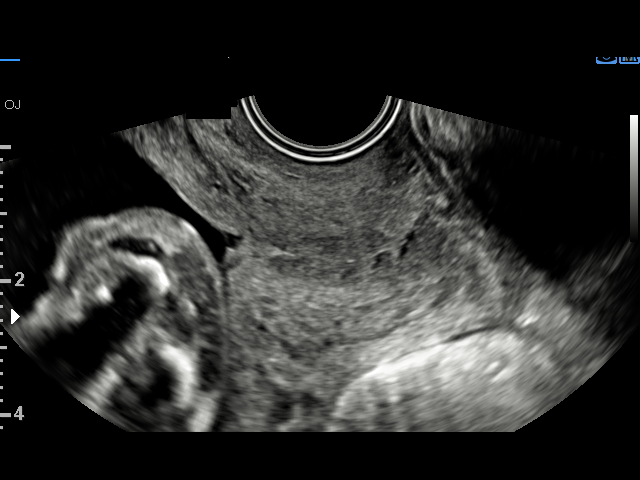
[im 25/33]
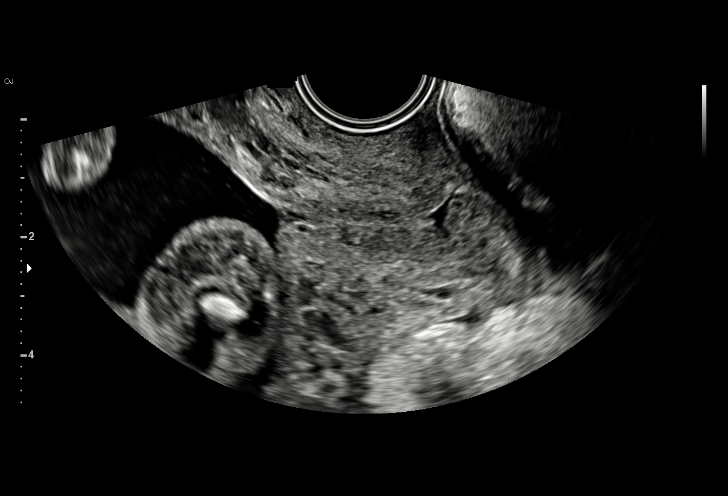
[im 28/33]
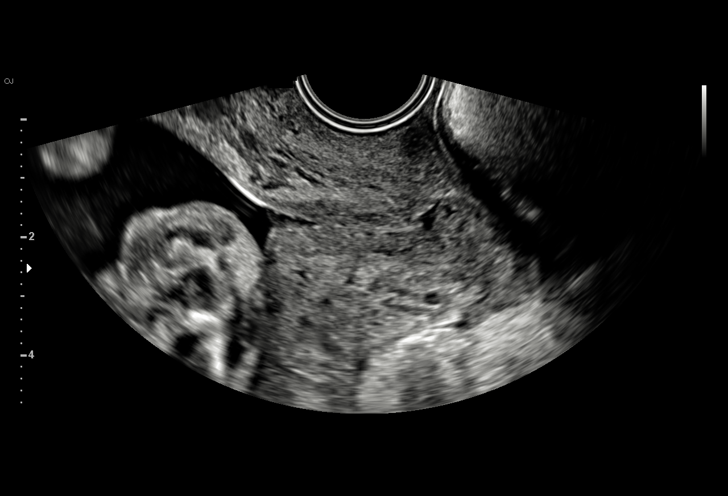
[im 30/33]
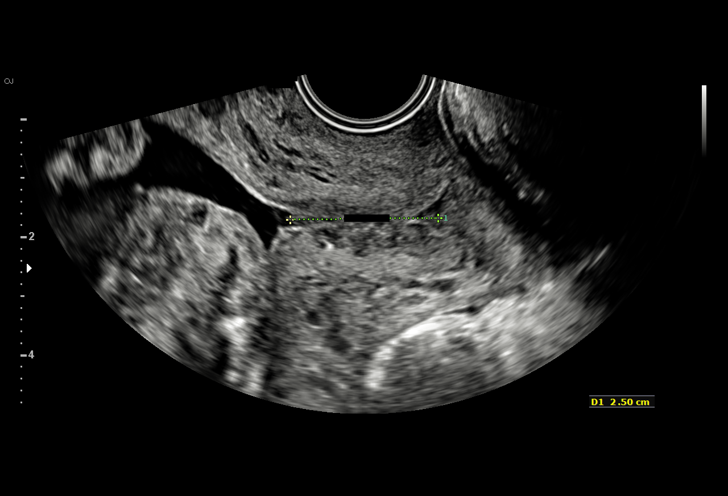
[im 33/33]
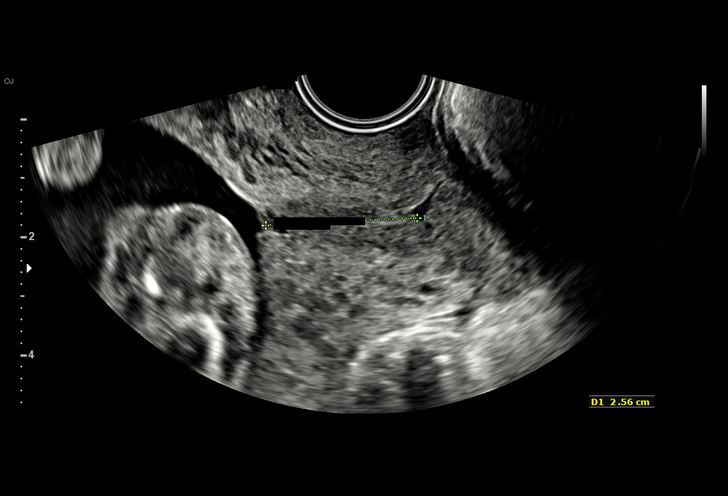

[15 of 28 positions shown; findings below may reference images not displayed]

LUCA-VIKI NP

Indications

21 weeks gestation of pregnancy
Poor obstetric history: Previous preterm
delivery at 21 weeks, antepartum; 17P
Cervical incompetence, second trimester
Hypothyroid
OB History

Blood Type:            Height:  5'4"   Weight (lb):  135       BMI:
Gravidity:    4         Prem:   1         SAB:   2
Living:       0
Fetal Evaluation

Num Of Fetuses:     1
Fetal Heart         151
Rate(bpm):
Cardiac Activity:   Observed
Presentation:       Breech
Placenta:           Anterior, above cervical os

Amniotic Fluid
AFI FV:      Subjectively within normal limits

Largest Pocket(cm)
5.19
Gestational Age

LMP:           21w 1d        Date:  07/18/17                 EDD:   04/24/18
Best:          21w 1d     Det. By:  LMP  (07/18/17)          EDD:   04/24/18
Cervix Uterus Adnexa

Cervix
Length:            2.2  cm.
Measured transvaginally.

Uterus
No abnormality visualized.

Left Ovary
Size(cm)       1.6  x   1.6    x  1.7       Vol(ml):
Within normal limits.

Right Ovary
Not visualized.

Cul De Sac:   No free fluid seen.

Adnexa:       No abnormality visualized.
Impression

SIUP at 21+1 weeks with cardiac activity
Normal amniotic fluid volume
EV views of cervix: shortened cervical length (2.2 cms) with
minimal funneling; this CL is significantly shorter than the
previous US on [DATE] (3.25 cms)

The US findings were shared with Ms. Bapst. The
implications of a progressively shortening cervical length with
a previous 21 week delivery were discussed in detail. We
reviewed the risks and benefits of expectant management
with the addition of vaginal progesterone and cerclage
placement. After careful consideration, Ms. Bapst decided
to pursue cerclage placement.

Case discussed with Dr. Yahir.
Recommendations

Follow-up ultrasound for cervical length in one week

## 2018-12-22 IMAGING — US US MFM OB TRANSVAGINAL
1 series · 15 of 23 positions shown · non-contrast
Comparison: none

[Series 1: us mfm ob transvaginal · 23 acquisitions, 15 frames shown]
[im 1/23]
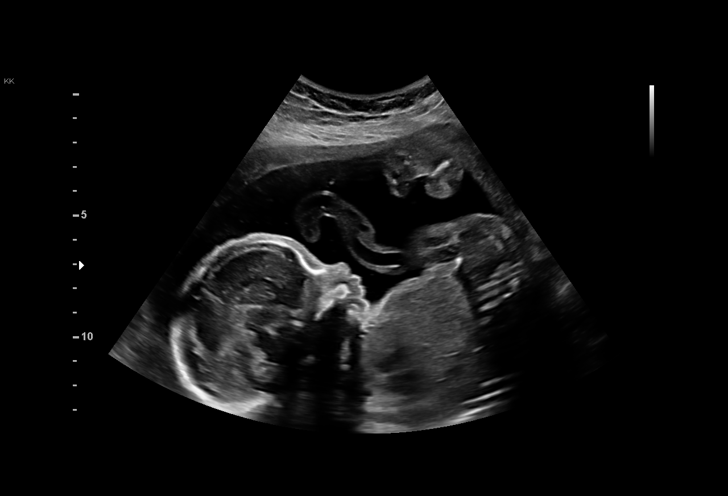
[im 3/23]
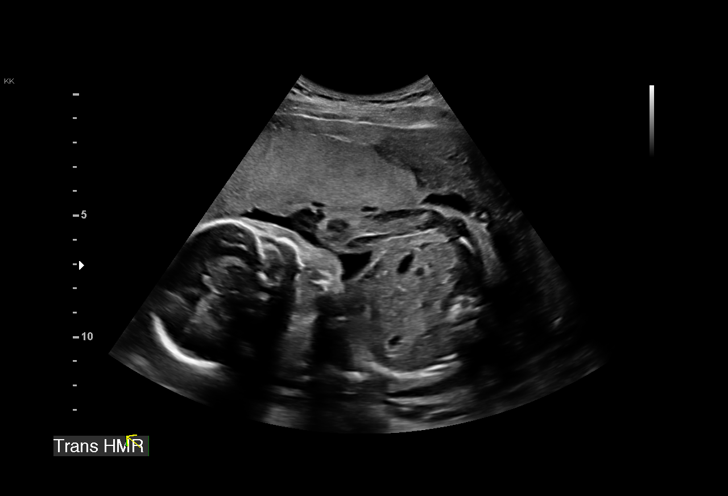
[im 4/23]
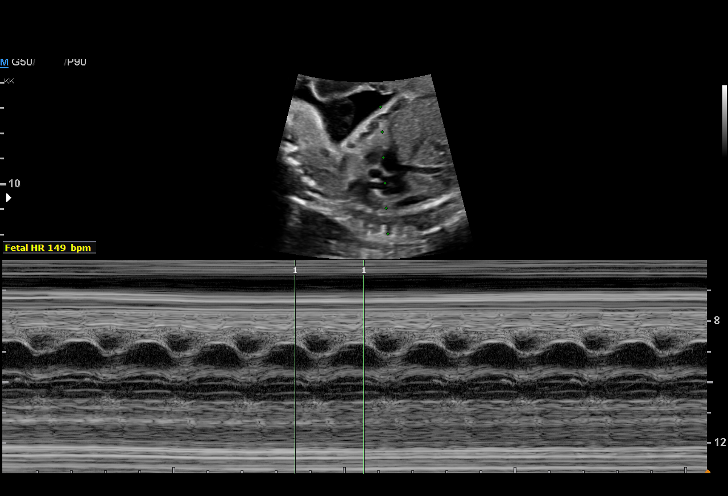
[im 6/23]
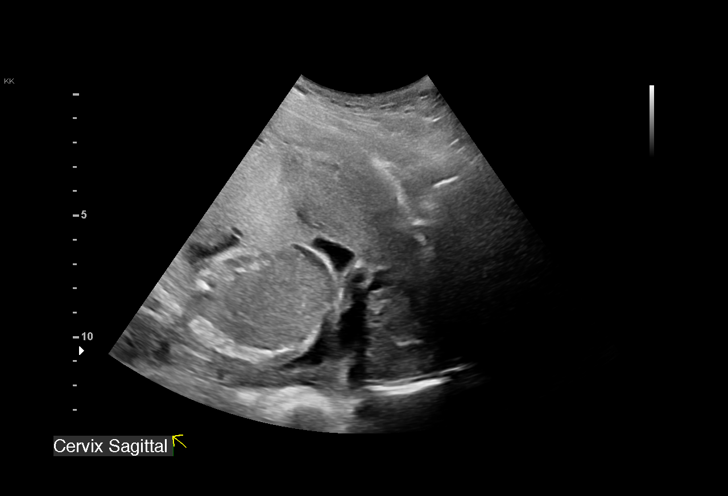
[im 7/23]
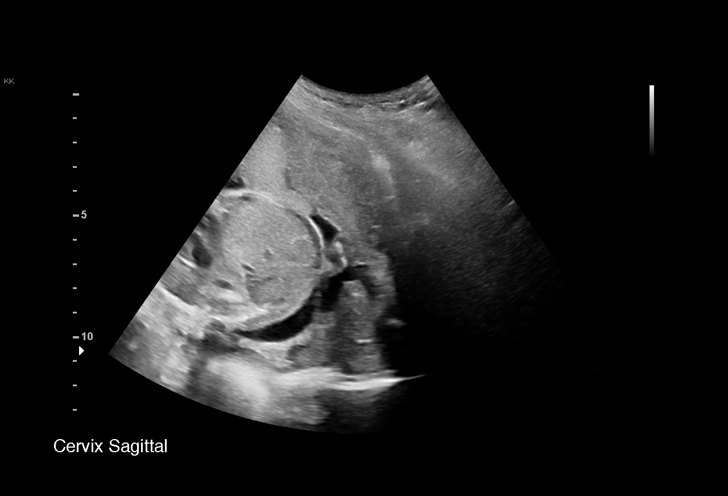
[im 9/23]
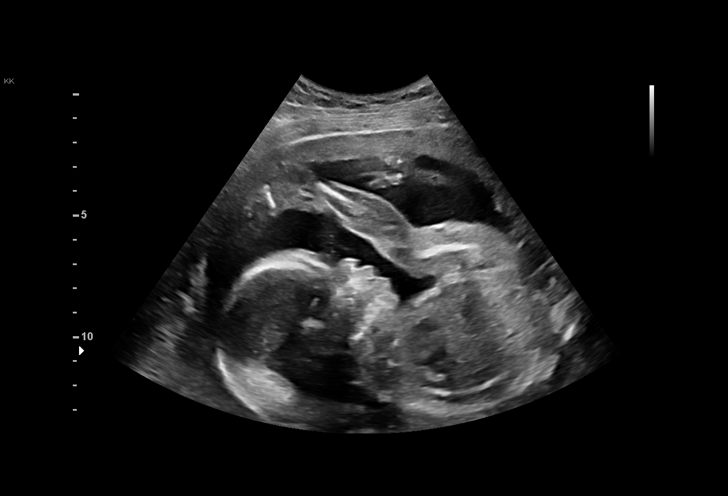
[im 10/23]
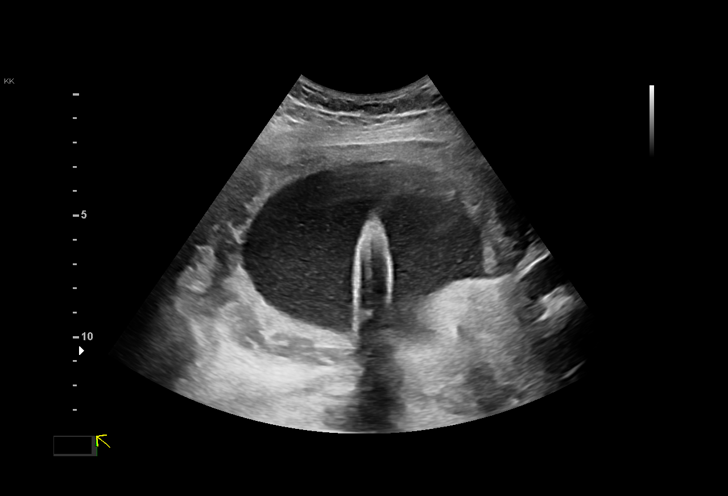
[im 12/23]
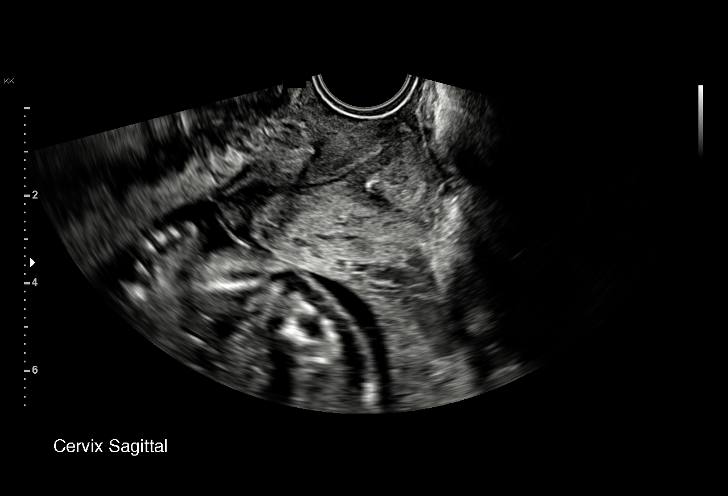
[im 14/23]
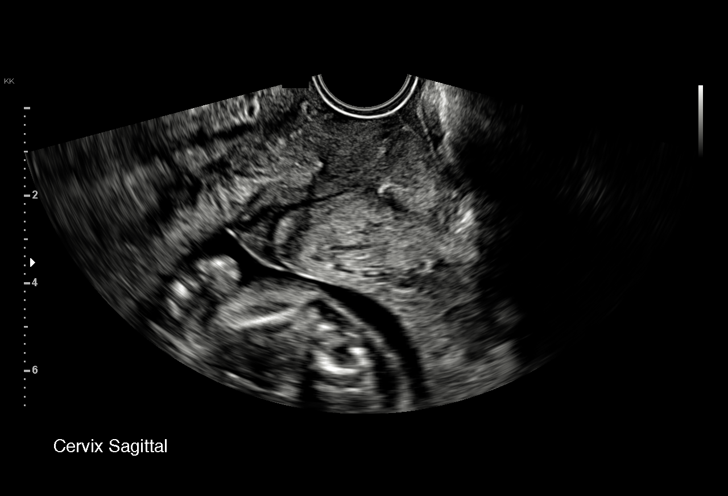
[im 15/23]
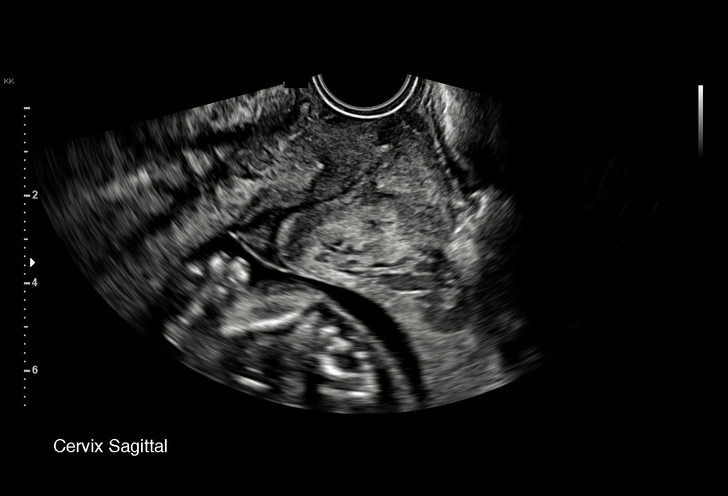
[im 17/23]
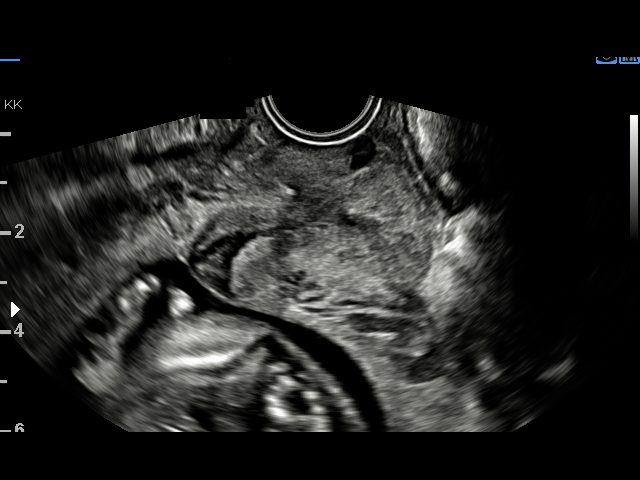
[im 18/23]
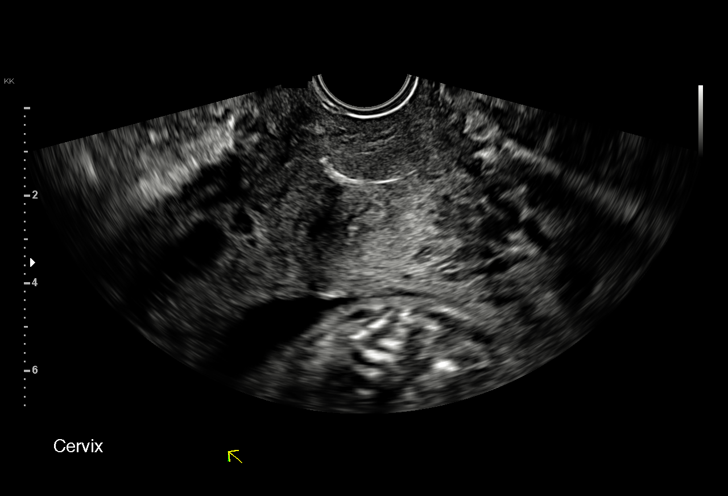
[im 20/23]
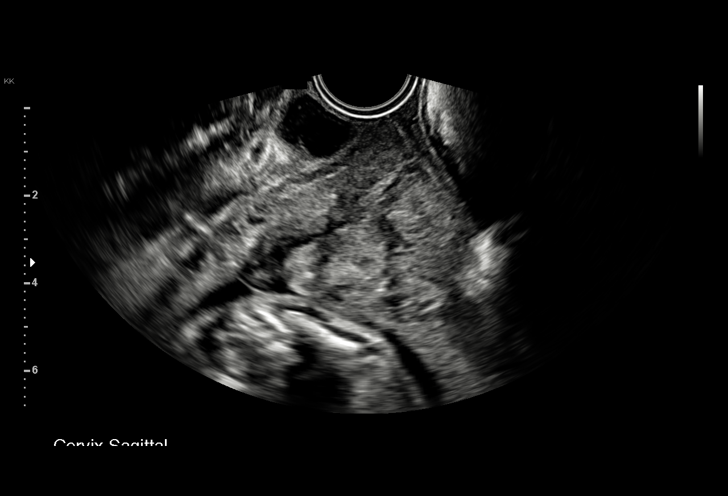
[im 21/23]
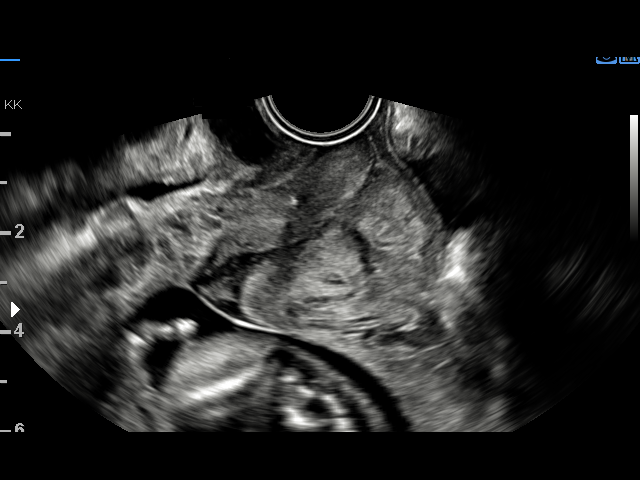
[im 23/23]
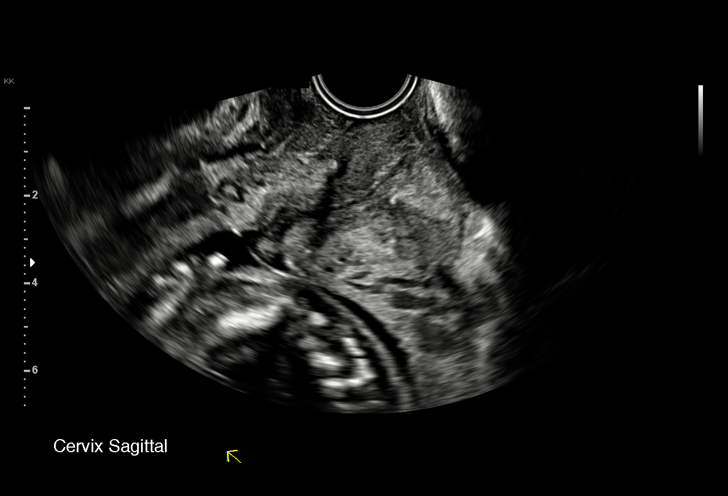

[15 of 23 positions shown; findings below may reference images not displayed]

OB/Gyn Clinic
Ref. Address:     Faculty Fellow

[address]              [id number]      [phone]     [phone]
Indications

24 weeks gestation of pregnancy
Poor obstetric history: Previous preterm
delivery at 21 weeks, antepartum; 17P
Cervical incompetence, second trimester
(s/p cerclage 12/15/17)
Hypothyroid
Chorioamnionitis (with prev pregnancy)
Cervical cerclage suture present, second
trimester
OB History

Blood Type:            Height:  5'4"   Weight (lb):  135       BMI:
Gravidity:    4         Prem:   1         SAB:   2
Living:       0
Fetal Evaluation

Num Of Fetuses:     1
Fetal Heart         149
Rate(bpm):
Cardiac Activity:   Observed
Presentation:       Transverse, head to maternal right
Amniotic Fluid
AFI FV:      Subjectively within normal limits
Gestational Age

LMP:           24w 3d        Date:  07/18/17                 EDD:   04/24/18
Best:          24w 3d     Det. By:  LMP  (07/18/17)          EDD:   04/24/18
Cervix Uterus Adnexa

Cervix
Length:            2.8  cm.
Measured transvaginally. Cerclage visualized.
Impression

SIUP at 24+3 weeks
Normal amniotic fluid volume
EV views of cervix: CL = 
 2.8 cms; mild LUS contraction;
suture visualized and appeared to be intact
Recommendations

Follow-up ultrasound for growth and CL next week (previously
scheduled)

## 2019-06-12 ENCOUNTER — Encounter: Payer: Self-pay | Admitting: Family Medicine

## 2019-06-12 ENCOUNTER — Other Ambulatory Visit: Payer: Self-pay

## 2019-06-12 ENCOUNTER — Other Ambulatory Visit (HOSPITAL_COMMUNITY)
Admission: RE | Admit: 2019-06-12 | Discharge: 2019-06-12 | Disposition: A | Discharge status: Home / Self Care | Payer: Medicaid Other | Source: Ambulatory Visit | Attending: Family Medicine | Admitting: Family Medicine

## 2019-06-12 ENCOUNTER — Ambulatory Visit (INDEPENDENT_AMBULATORY_CARE_PROVIDER_SITE_OTHER): Payer: Self-pay | Admitting: Family Medicine

## 2019-06-12 VITALS — BP 107/70 | HR 73 | Temp 98.6°F | Ht 64.0 in | Wt 140.0 lb

## 2019-06-12 DIAGNOSIS — N898 Other specified noninflammatory disorders of vagina: Secondary | ICD-10-CM

## 2019-06-12 DIAGNOSIS — Z3046 Encounter for surveillance of implantable subdermal contraceptive: Secondary | ICD-10-CM

## 2019-06-12 MED ORDER — CEPHALEXIN 500 MG PO CAPS: 500.0000 mg | ORAL_CAPSULE | Freq: Three times a day (TID) | ORAL | 0 refills | Status: DC

## 2019-06-12 NOTE — Progress Notes (Signed)
11/3/202011:02 AM  Andrea Barry [birth year], 33 y.o., female [id number]  Chief Complaint  Patient presents with  . nexplanon    Pt stated having pain lower frontal abdominal/back, headache, feet pain since had the birth controll.    HPI:   Patient is a 33 y.o. female  who presents today for nexplanon removal  Placed PP in oct 2019, left arm Feels since placement she has been having various issues, headaches, mood changes, constant brownish discharge, lower pelvic pain Denies any odor or vaginal irritation She does not want any other forms of birth control  Depression screen [facility] 2/9 06/12/2019 05/08/2018 04/19/2018  Decreased Interest 0 1 1  Down, Depressed, Hopeless 0 1 1  PHQ - 2 Score 0 2 2  Altered sleeping - 1 1  Tired, decreased energy - 1 1  Change in appetite - 1 1  Feeling bad or failure about yourself  - 0 1  Trouble concentrating - 1 1  Moving slowly or fidgety/restless - 0 1  Suicidal thoughts - 0 0  PHQ-9 Score - 6 8  Some recent data might be hidden    Fall Risk  06/12/2019 12/20/2017 10/21/2017 08/25/2017 06/20/2017  Falls in the past year? 0 No No No No  Number falls in past yr: 0 - - - -  Injury with Fall? 0 - - - -     Allergies  Allergen Reactions  . Metronidazole Other (See Comments)    Dizziness, taste of iron in mouth, and darkened urine    Prior to Admission medications   Medication Sig Start Date End Date Taking? Authorizing Provider  ferrous sulfate 325 (65 FE) MG tablet Take 1 tablet (325 mg total) by mouth daily with breakfast. 04/14/17   Rutherford Guys, MD  ibuprofen (ADVIL,MOTRIN) 600 MG tablet Take 1 tablet (600 mg total) by mouth every 6 (six) hours as needed. 04/21/18   Myrtis Ser, CNM  prenatal vitamin w/FE, FA (PRENATAL 1 + 1) 27-1 MG TABS tablet Take 1 tablet by mouth daily at 12 noon. 11/23/17   Woodroe Mode, MD    Past Medical History:  Diagnosis Date  . Allergy   . Anemia   . Cervical incompetence    2nd trimester  fetal loss  . Cervical incompetence in pregnancy, second trimester 10/13/2017   21 week loss 07/2016  . Hypothyroidism    Last TSH (04/2017) was normal, off levothyroxine for months    Past Surgical History:  Procedure Laterality Date  . CERVICAL CERCLAGE N/A 12/15/2017   Procedure: CERCLAGE CERVICAL;  Surgeon: Osborne Oman, MD;  Location: [facility];  Service: Gynecology;  Laterality: N/A;  . NO PAST SURGERIES      Social History   Tobacco Use  . Smoking status: Never Smoker  . Smokeless tobacco: Never Used  Substance Use Topics  . Alcohol use: No    Family History  Problem Relation Age of Onset  . Diabetes Mother   . Hypertension Mother   . Mental illness Maternal Grandmother   . Hypertension Paternal Grandmother     ROS Per hpi  OBJECTIVE:  Today's Vitals   06/12/19 1059  BP: 107/70  Pulse: 73  Temp: 98.6 F (37 C)  SpO2: 99%  Weight: 140 lb (63.5 kg)  Height: 5\' 4"  (1.626 m)   Body mass index is 24.03 kg/m.   Physical Exam Vitals signs and nursing note reviewed.  Constitutional:      Appearance: She is well-developed.  HENT:     Head: Normocephalic and atraumatic.  Eyes:     General: No scleral icterus.    Conjunctiva/sclera: Conjunctivae normal.     Pupils: Pupils are equal, round, and reactive to light.  Neck:     Musculoskeletal: Neck supple.  Pulmonary:     Effort: Pulmonary effort is normal.  Skin:    General: Skin is warm and dry.  Neurological:     Mental Status: She is alert and oriented to person, place, and time.    procedure note for nexplanon removal: After discussing r/se/b of nexplanon removal, verbal informed consent obtained.  Patient placed on supine position, left arm placed at 90 degree angle exposing area were nexplanon was palpable. At distal edge, area was cleansed with betadine. Local anesthesia obtained with 1 cc of 1% lido with epi. A small incision was made with 11 blade. Blunt dissection used to break thru capsule,  distal edge secured with hemostat however when pulled for removal, it became apparent that proximal edge of nexplanon was tethered as tenting of skin appreciated. Unable to reach area for continued blunt dissection, therefore decision was made to make a second incision in same manner at proximal edge and blunt dissect from there. This resulted in successful removal of nexplanon. Incisions were covered with steri-stripes, pressure dressings placed. Prophylactic abx prescribed rx given degree of manipulation. Routine wound care instructions and precautions given. Patient tolerated procedure well.  Procedure assisted by CMA   No results found for this or any previous visit (from the past 24 hour(s)).  No results found.   ASSESSMENT and PLAN  1. Nexplanon removal Complicated but successful removal. Routine care and return to care precautions reviewed. abx for prophylaxis.  2. Vaginal discharge - Urine cytology ancillary only  Other orders - cephALEXin (KEFLEX) 500 MG capsule; Take 1 capsule (500 mg total) by mouth 3 (three) times daily.  Return in about 4 weeks (around 07/10/2019) for mood, vaginal discharge, 2 days wound check.    Rutherford Guys, MD Primary Care at [place], [place] [address] Ph.  [phone] Fax [phone]

## 2019-06-12 NOTE — Patient Instructions (Addendum)
° ° ° °  If you have lab work done today you will be contacted with your lab results within the next 2 weeks.  If you have not heard from us then please contact us. The fastest way to get your results is to register for My Chart. ° ° °IF you received an x-ray today, you will receive an invoice from [facility]. Please contact  Radiology at [phone] with questions or concerns regarding your invoice.  ° °IF you received labwork today, you will receive an invoice from [facility]. Please contact [facility] at [phone] with questions or concerns regarding your invoice.  ° °Our billing staff will not be able to assist you with questions regarding bills from these companies. ° °You will be contacted with the lab results as soon as they are available. The fastest way to get your results is to activate your My Chart account. Instructions are located on the last page of this paperwork. If you have not heard from us regarding the results in 2 weeks, please contact this office. °  ° ° ° °

## 2019-06-14 ENCOUNTER — Ambulatory Visit (INDEPENDENT_AMBULATORY_CARE_PROVIDER_SITE_OTHER): Payer: Self-pay | Admitting: Family Medicine

## 2019-06-14 ENCOUNTER — Other Ambulatory Visit: Payer: Self-pay

## 2019-06-14 ENCOUNTER — Encounter: Payer: Self-pay | Admitting: Family Medicine

## 2019-06-14 VITALS — BP 110/64 | HR 79 | Temp 98.3°F | Resp 16 | Wt 141.8 lb

## 2019-06-14 DIAGNOSIS — Z5189 Encounter for other specified aftercare: Secondary | ICD-10-CM

## 2019-06-14 NOTE — Progress Notes (Signed)
   11/5/20204:12 PM  Andrea Barry September 17, 1985, 33 y.o., female [id number]  Chief Complaint  Patient presents with  . Nexplanon Follow-up    Lt arm---    HPI:   Patient is a 33 y.o. female who presents today for wound check after nexplanon removal 2 days ago  She is doing well No bruising or hematoma No bleeding or drainage  Depression screen [facility] 2/9 06/14/2019 06/12/2019 05/08/2018  Decreased Interest 0 0 1  Down, Depressed, Hopeless 0 0 1  PHQ - 2 Score 0 0 2  Altered sleeping - - 1  Tired, decreased energy - - 1  Change in appetite - - 1  Feeling bad or failure about yourself  - - 0  Trouble concentrating - - 1  Moving slowly or fidgety/restless - - 0  Suicidal thoughts - - 0  PHQ-9 Score - - 6  Some recent data might be hidden    Fall Risk  06/14/2019 06/12/2019 12/20/2017 10/21/2017 08/25/2017  Falls in the past year? 0 0 No No No  Number falls in past yr: 0 0 - - -  Injury with Fall? 0 0 - - -     Allergies  Allergen Reactions  . Metronidazole Other (See Comments)    Dizziness, taste of iron in mouth, and darkened urine    Prior to Admission medications   Medication Sig Start Date End Date Taking? Authorizing Provider  cephALEXin (KEFLEX) 500 MG capsule Take 1 capsule (500 mg total) by mouth 3 (three) times daily. 06/12/19  Yes Rutherford Guys, MD  ferrous sulfate 325 (65 FE) MG tablet Take 1 tablet (325 mg total) by mouth daily with breakfast. 04/14/17  Yes Rutherford Guys, MD    Past Medical History:  Diagnosis Date  . Allergy   . Anemia   . Cervical incompetence    2nd trimester fetal loss  . Cervical incompetence in pregnancy, second trimester 10/13/2017   21 week loss 07/2016  . Hypothyroidism    Last TSH (04/2017) was normal, off levothyroxine for months    Past Surgical History:  Procedure Laterality Date  . CERVICAL CERCLAGE N/A 12/15/2017   Procedure: CERCLAGE CERVICAL;  Surgeon: Osborne Oman, MD;  Location: [facility];  Service: Gynecology;   Laterality: N/A;  . NO PAST SURGERIES      Social History   Tobacco Use  . Smoking status: Never Smoker  . Smokeless tobacco: Never Used  Substance Use Topics  . Alcohol use: No    Family History  Problem Relation Age of Onset  . Diabetes Mother   . Hypertension Mother   . Mental illness Maternal Grandmother   . Hypertension Paternal Grandmother     ROS Per hpi  OBJECTIVE:  Today's Vitals   06/14/19 1557  BP: 110/64  Pulse: 79  Resp: 16  Temp: 98.3 F (36.8 C)  TempSrc: Oral  SpO2: 99%  Weight: 141 lb 12.8 oz (64.3 kg)   Body mass index is 24.34 kg/m.   Physical Exam  Gen: AAOx3, NAD Left arm: incisions healing well, no bleeding, erythema, swelling, TTP, or drainage  No results found for this or any previous visit (from the past 24 hour(s)).  No results found.   ASSESSMENT and PLAN  1. Visit for wound check Doing well. Cont routine care/precautions Complete abx  Return for as scheduled.    Rutherford Guys, MD Primary Care at [place], [place] [address] Ph.  [phone] Fax [phone]

## 2019-06-14 NOTE — Patient Instructions (Signed)
° ° ° °  If you have lab work done today you will be contacted with your lab results within the next 2 weeks.  If you have not heard from us then please contact us. The fastest way to get your results is to register for My Chart. ° ° °IF you received an x-ray today, you will receive an invoice from [facility]. Please contact  Radiology at [phone] with questions or concerns regarding your invoice.  ° °IF you received labwork today, you will receive an invoice from [facility]. Please contact [facility] at [phone] with questions or concerns regarding your invoice.  ° °Our billing staff will not be able to assist you with questions regarding bills from these companies. ° °You will be contacted with the lab results as soon as they are available. The fastest way to get your results is to activate your My Chart account. Instructions are located on the last page of this paperwork. If you have not heard from us regarding the results in 2 weeks, please contact this office. °  ° ° ° °

## 2019-06-18 ENCOUNTER — Encounter: Payer: Self-pay | Admitting: Family Medicine

## 2019-06-18 LAB — URINE CYTOLOGY ANCILLARY ONLY
Bacterial Vaginitis-Urine: POSITIVE — AB
Candida Urine: NEGATIVE
Chlamydia: POSITIVE — AB
Comment: NEGATIVE
Comment: NEGATIVE
Comment: NORMAL
Neisseria Gonorrhea: NEGATIVE
Trichomonas: NEGATIVE

## 2019-06-18 MED ORDER — METRONIDAZOLE 0.75 % VA GEL: 1.0000 | Freq: Every day | VAGINAL | 0 refills | Status: DC

## 2019-06-18 MED ORDER — AZITHROMYCIN 250 MG PO TABS: 1000.0000 mg | ORAL_TABLET | Freq: Once | ORAL | 0 refills | Status: AC

## 2019-06-18 NOTE — Addendum Note (Signed)
Addended by: Rutherford Guys on: 06/18/2019 01:26 PM   Modules accepted: Orders

## 2019-06-19 ENCOUNTER — Other Ambulatory Visit: Payer: Self-pay | Admitting: Family Medicine

## 2019-06-19 NOTE — Telephone Encounter (Signed)
Copied from [facility] #[id number]. Topic: Quick Communication - Rx Refill/Question >> Jun 19, 2019  3:52 PM Izola Price, [place] A wrote: Medication: metroNIDAZOLE (METROGEL) 0.75 % vaginal gel ,azithromycin (ZITHROMAX) 250 MG tablet [id number] (Patient stated that pharmacy does not have medication .)  Has the patient contacted their pharmacy? Yes (Agent: If no, request that the patient contact the pharmacy for the refill.) (Agent: If yes, when and what did the pharmacy advise?)Contact PCP  Preferred Pharmacy (with phone number or street name): [facility], [place] [phone] (Phone) [phone] (Fax)    Agent: Please be advised that RX refills may take up to 3 business days. We ask that you follow-up with your pharmacy.

## 2019-06-19 NOTE — Telephone Encounter (Signed)
Patient is also requesting azithromycin, (ZITHROMAX) please see encounter below.

## 2019-06-19 NOTE — Telephone Encounter (Signed)
Requested medication (s) are due for refill today: yes  Requested medication (s) are on the active medication list: yes  Last refill:  06/18/2019  Patient states pharmacy never received  Also requesting Azithromycin   Future visit scheduled: yes  Notes to clinic:  Medication off protocol. Patient with additional request for azithromycin    Requested Prescriptions  Pending Prescriptions Disp Refills   metroNIDAZOLE (METROGEL) 0.75 % vaginal gel 70 g 0    Sig: Place 1 Applicatorful vaginally at bedtime.     Off-Protocol Failed - 06/19/2019  4:08 PM      Failed - Medication not assigned to a protocol, review manually.      Passed - Valid encounter within last 12 months    Recent Outpatient Visits          5 days ago Visit for wound check   [facility], Lilia Argue, MD   1 week ago Nexplanon removal   [facility], Lilia Argue, MD   1 year ago Positive urine pregnancy test   [facility], Lilia Argue, MD   1 year ago Dysuria   [facility], Lilia Argue, MD   2 years ago Encounter for annual routine gynecological examination   [facility], Lilia Argue, MD      Future Appointments            In 3 weeks Rutherford Guys, MD Primary Care at [place], [facility]

## 2019-06-20 ENCOUNTER — Telehealth: Payer: Self-pay | Admitting: *Deleted

## 2019-06-20 NOTE — Telephone Encounter (Signed)
Unable to reach patient through interpretor. Left message to call back.   Please advise patient her prescriptions are at the pharmacy waiting for her to pick up.    Pharmacy advised the card they have on file is not active and will need a new insurance card to run.

## 2019-07-10 ENCOUNTER — Ambulatory Visit: Payer: Self-pay | Admitting: Family Medicine

## 2019-07-11 ENCOUNTER — Encounter: Payer: Self-pay | Admitting: Family Medicine

## 2019-08-10 NOTE — L&D Delivery Note (Signed)
OB/GYN Faculty Practice Delivery Note  Andrea Barry is a 34 y.o. [id number] s/p SVD at [redacted]w[redacted]d. She was admitted for labor.   ROM: 0h 74m with clear fluid GBS Status:  Negative/-- (11/29 1251) Maximum Maternal Temperature: 98  Labor Progress: . Initial SVE: 7/100/0. She then progressed to complete.   Delivery Date/Time: 12/12 at 04:54 Delivery: Called to room and patient was complete and pushing. Head delivered LOP. No nuchal cord present. Shoulder and body delivered in usual fashion. Infant with spontaneous cry, placed on mother's abdomen, dried and stimulated. Cord clamped x 2 after 1-minute delay, and cut by patient. Cord blood drawn. Placenta delivered spontaneously with gentle cord traction. Fundal rub expressed multiple golf ball sized clots, 1g TXA given. Fundus firm with massage and Pitocin. Labia, perineum, vagina, and cervix inspected inspected with second degree perineal laceration, repaired.  Baby Weight: pending  Placenta: Sent to L&D Complications: None Lacerations: second degree, repaired EBL: 200 mL Analgesia: Epidural   Infant:  APGAR (1 MIN): 9   APGAR (5 MINS): 9   APGAR (10 MINS):     Sharene Skeans, MD [facility] Family Medicine Fellow, [facility] for [facility] 07/20/2020, 5:22 AM

## 2019-11-22 ENCOUNTER — Encounter: Payer: Self-pay | Admitting: *Deleted

## 2019-11-23 ENCOUNTER — Telehealth: Payer: Self-pay | Admitting: Family Medicine

## 2019-11-23 NOTE — Telephone Encounter (Signed)
callled pt LVM to call us back / when she calls back we need to schedule a fasting lab with pat 3-4 days before her physical which is scheduled for 12/13/2019

## 2019-11-28 ENCOUNTER — Telehealth: Payer: Self-pay | Admitting: Family Medicine

## 2019-11-28 NOTE — Telephone Encounter (Signed)
Called pt LVM for patient to call back and schedule lab visit 3-4 days before physical.

## 2019-12-13 ENCOUNTER — Encounter: Payer: Self-pay | Admitting: Family Medicine

## 2019-12-13 ENCOUNTER — Ambulatory Visit (INDEPENDENT_AMBULATORY_CARE_PROVIDER_SITE_OTHER): Payer: Medicaid Other | Admitting: Family Medicine

## 2019-12-13 ENCOUNTER — Other Ambulatory Visit: Payer: Self-pay

## 2019-12-13 ENCOUNTER — Telehealth: Payer: Self-pay | Admitting: Emergency Medicine

## 2019-12-13 VITALS — BP 97/60 | HR 70 | Temp 98.2°F | Resp 14 | Ht 63.5 in | Wt 120.2 lb

## 2019-12-13 DIAGNOSIS — Z3201 Encounter for pregnancy test, result positive: Secondary | ICD-10-CM

## 2019-12-13 DIAGNOSIS — E039 Hypothyroidism, unspecified: Secondary | ICD-10-CM

## 2019-12-13 DIAGNOSIS — R300 Dysuria: Secondary | ICD-10-CM | POA: Diagnosis not present

## 2019-12-13 DIAGNOSIS — N898 Other specified noninflammatory disorders of vagina: Secondary | ICD-10-CM | POA: Diagnosis not present

## 2019-12-13 LAB — POCT URINALYSIS DIP (MANUAL ENTRY)
Bilirubin, UA: NEGATIVE
Blood, UA: NEGATIVE
Glucose, UA: NEGATIVE mg/dL
Ketones, POC UA: NEGATIVE mg/dL
Leukocytes, UA: NEGATIVE
Nitrite, UA: NEGATIVE
Protein Ur, POC: NEGATIVE mg/dL
Spec Grav, UA: 1.02 (ref 1.010–1.025)
Urobilinogen, UA: 0.2 E.U./dL
pH, UA: 6 (ref 5.0–8.0)

## 2019-12-13 LAB — POCT WET + KOH PREP
Trich by wet prep: ABSENT
Yeast by KOH: ABSENT
Yeast by wet prep: ABSENT

## 2019-12-13 LAB — POCT URINE PREGNANCY: Preg Test, Ur: POSITIVE — AB

## 2019-12-13 NOTE — Telephone Encounter (Signed)
Left a msg on patient machine to return call. When patient return call please inform patient that Dr Pamella Pert wanted to get a self swab in office. Please have her to stop back by the office for a swab

## 2019-12-13 NOTE — Patient Instructions (Signed)
° ° ° °  If you have lab work done today you will be contacted with your lab results within the next 2 weeks.  If you have not heard from us then please contact us. The fastest way to get your results is to register for My Chart. ° ° °IF you received an x-ray today, you will receive an invoice from [facility]. Please contact [facility] at [phone] with questions or concerns regarding your invoice.  ° °IF you received labwork today, you will receive an invoice from [facility]. Please contact [facility] at [phone] with questions or concerns regarding your invoice.  ° °Our billing staff will not be able to assist you with questions regarding bills from these companies. ° °You will be contacted with the lab results as soon as they are available. The fastest way to get your results is to activate your My Chart account. Instructions are located on the last page of this paperwork. If you have not heard from us regarding the results in 2 weeks, please contact this office. °  ° ° ° °
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
## 2019-12-14 LAB — TSH: TSH: 3.09 u[IU]/mL (ref 0.450–4.500)

## 2019-12-21 ENCOUNTER — Telehealth: Payer: Self-pay | Admitting: Family Medicine

## 2019-12-21 NOTE — Telephone Encounter (Signed)
Attempted to call patient with Spanish interpreter Andrea Barry to get her scheduled to start prenatal care at our office. No answer, let voicemail for patient to give the office a call back.

## 2020-01-14 ENCOUNTER — Ambulatory Visit (INDEPENDENT_AMBULATORY_CARE_PROVIDER_SITE_OTHER): Payer: Medicaid Other | Admitting: Clinical

## 2020-01-14 ENCOUNTER — Encounter: Payer: Self-pay | Admitting: *Deleted

## 2020-01-14 ENCOUNTER — Ambulatory Visit (INDEPENDENT_AMBULATORY_CARE_PROVIDER_SITE_OTHER): Payer: Medicaid Other | Admitting: *Deleted

## 2020-01-14 ENCOUNTER — Other Ambulatory Visit: Payer: Self-pay

## 2020-01-14 VITALS — Wt 123.5 lb

## 2020-01-14 DIAGNOSIS — E038 Other specified hypothyroidism: Secondary | ICD-10-CM

## 2020-01-14 DIAGNOSIS — O0990 Supervision of high risk pregnancy, unspecified, unspecified trimester: Secondary | ICD-10-CM | POA: Insufficient documentation

## 2020-01-14 DIAGNOSIS — Z658 Other specified problems related to psychosocial circumstances: Secondary | ICD-10-CM

## 2020-01-14 DIAGNOSIS — F419 Anxiety disorder, unspecified: Secondary | ICD-10-CM

## 2020-01-14 DIAGNOSIS — O99281 Endocrine, nutritional and metabolic diseases complicating pregnancy, first trimester: Secondary | ICD-10-CM

## 2020-01-14 DIAGNOSIS — F32A Depression, unspecified: Secondary | ICD-10-CM | POA: Insufficient documentation

## 2020-01-14 DIAGNOSIS — R45851 Suicidal ideations: Secondary | ICD-10-CM

## 2020-01-14 DIAGNOSIS — F332 Major depressive disorder, recurrent severe without psychotic features: Secondary | ICD-10-CM

## 2020-01-14 DIAGNOSIS — O99341 Other mental disorders complicating pregnancy, first trimester: Secondary | ICD-10-CM

## 2020-01-14 DIAGNOSIS — Z3A10 10 weeks gestation of pregnancy: Secondary | ICD-10-CM

## 2020-01-14 DIAGNOSIS — Z87898 Personal history of other specified conditions: Secondary | ICD-10-CM | POA: Insufficient documentation

## 2020-01-14 DIAGNOSIS — O09899 Supervision of other high risk pregnancies, unspecified trimester: Secondary | ICD-10-CM | POA: Insufficient documentation

## 2020-01-14 MED ORDER — PRENATAL 27-0.8 MG PO TABS: 1.0000 | ORAL_TABLET | Freq: Every day | ORAL | 11 refills | Status: DC
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
## 2020-01-14 NOTE — Patient Instructions (Signed)
[place] [address] [place], [place] [address] [phone]   or  www.http://james-garner.info/ **SNAP/EBT/ Other nutritional benefits  [facility] [address], [place], [place] [address] [phone]  or  https://palmer-smith.com/ **WIC for  women who are pregnant and postpartum, infants and children up to 34 years old  [place] [address], [place], [place] [address] [phone]   or   www.theblessedtable.org  **Food pantry  Brother Kolbe's [place], [place], [place] [address] [phone]   or   https://brotherkolbes.godaddysites.com  **Emergency food and prepared meals  [place] [address] [place], [place] [address] [phone]   or   www.cedargrovetop.us **Food pantry  [place] Pantry [address], [place], [place] [address] [phone]   or   www.https://hartman-jones.net/ **Food pantry  [employer] Hands Food Pantry [address] [place], [place] [address] [phone] **Food pantry  [facility] [address], [place], [place] [address] [phone]   or   www.greensborourbanministry.org  Insurance underwriter and prepared meals  [facility] [address] [place], [place], [place], [place] [address] DomainerFinder.be  **Food pantry  [place] [employer] Food Pantry [address] [place], [place] [address] [phone]   or   www.lbcnow.org  **Food pantry  One Step Further [address], [place], [place] [address] [phone]   or   http://patterson-parker.net/ **Food pantry, nutrition education, gardening activities  [place] [address] [place], [place] [address] [phone] **Food pantry  [facility] [place] [address] [place], [place] [address] [phone]   or   www.salvationarmyofgreensboro.Lovette Cliche of [place], [place], [place] [address] [phone]   or   http://senior-resources-guilford.org [facility] on [place] [address] [place], [place] [address] [phone]   or   www.stmattchurch.com  **Food pantry  [place] [address], [place], [place] [address] [phone]   or   vandaliapresbyterianchurch.org **Food pantry  [place] Pantry [address] [place], [place], [place] [address] [phone]   or   www.Mormon101.be Food Civil Service fast streamer of [place] [address] Jacinto Reap [place], [place] [address] [phone] **Food pantry  [place] Resources   Department of [facility] [address], [place], [place] [address] [phone]   or   www.co.[place].us/ph/  [place]) [address], [place], [place] [address] [phone]   or   MedicationWebsites.com.au **WIC for pregnant and postpartum women, infants and children up to 16 years old  Compassionate Pantry [address] [place], [place] [address] [phone] **Food pantry  [place] [address] [place], [place] [address] [phone]   or   emerywoodbaptistchurch.com *Food pantry  Five loaves Two Fish Food Pantry [address] [place], [place] [address] [phone]   or   www.fcchighpoint.Radonna RickerFood pantry  [facility] [address] [place], [place] [address] [phone]   or    http://www.green.com/ **Food pantry  [place] [address] [place],  [address] [phone]   or   www.facebook.com/KBCI1 Equities trader Pantry [place] [employer] Brands  [address], [place], [place] [address] [phone]   or   www.abbottscreek.org Ryder System of [place] [phone]   **Delivers meals  New Beginnings Full [facility] [address], [place], [place] [address] [phone]   or   nbfgm.sundaystreamwebsites.com  Programme researcher, broadcasting/film/video of Fortune [employer]ands [address], [place], [place] [address] [phone]   or   www.odm-hp.org  **Food pantry  [place] [address], [place], [place] [address] [phone]   or   R2live.tv **Food pantry  [place] [address], [place], [place] [address] [phone]   or   ClubMonetize.fr **Emergency food and pet food  Senior [place] [address] [place], [place] [address] [phone]   or   www.senioradults.org **Congregate and delivered meals to older adults  [place] Way of Greater Fortune Brands [address], [place], [place] [address] [phone]   or   SamedayNews.com.cy **Back Pack Program for elementary school students  Mutual [address] [place], [place] [address] [phone]   or   www.wardstreetcommunityresources.org **Food pantry  [facility] [address] [place], [place] [address] [phone]   or   https://www.carlson.net/ **Emergency food, nutrition classes, food budgeting  Food Resources [place]  [address] [place], [place], [place] [address] [phone]  or   www.co.[place].us/pview.aspx?id=14850&catid=407 **SNAP/Other nutrition benefits  [place], [place], [place] [address] [phone]   or  http://goodwin-walker.biz/  **SNAP/Other nutrition benefits  [place], [place], [place] [address] [phone]  or  http://www.rockinghamcountypublichealth.org **WIC for pregnant and postpartum women, infants and children up to 52 years old  Aging, Disability and [place] [address], [place], [place] [address] [phone]  or www.risodejaneiro.com **Prepared meals for older adults  [place] [address], [place], [place] [address] [phone]  or  NetworkingMixer.com.ee **Food pantry  Hands of God [address] [place], [place] [address] [phone]   or   https://www.handsofgod.org/  Insurance underwriter  Men in [place] [address], [place], [place] [address] [phone] **Food pantry  [facility] for [place], [place], [place] [address] [phone]   or   www.ci.[place].us/government/parks_and_recreation/senior_center/index.php **Congregate meal for older adults  [facility] [address] [place], [place] [address] [phone]   or www.reidsvilleoutreachcenter.org  **Food pantry  [facility] [address] [place], [place] [address] [phone]   or   http://reid-chang.com/ Building surveyor Resources [place]) [place], [place],  [address] https://www.Neffs-.gov/departments/transportation/gdot-divisions/-transit-agency-public-transportation-division     . Fixed-route bus services, including regional fare cards for PART, [place], [place], and WSTA buses.  . Reduced fare bus ID's available for Medicaid, Medicare, and "orange card"  recipients.  [place] Kitchen SCAT offers curb-to-curb and door-to-door bus services for people with disabilities who are unable to use a fixed-bus route; also offers a shared-ride program.   Helpful tips:  -Routes available online and physical maps available at the main bus hub lobby (each for a specific route) -Smartphone directions often include bus routes (see the "bus" icon, next to the "car" and "walk" icons) -Routes differ on weekends, evenings and holidays, so  plan ahead!  -If you have Medicaid, Medicare, or orange card, plan to obtain a reduced-fare ID to save 50% on rides. Check days and times to obtain an ID, and bring all necessary documents.   E[employer] System [place]) [place], [place]. [address], [place], [place] [address] [phone] http://www.smith-bell.org/ **Fixed-bus route services, and demand response bus service for older adults  Department of Social [facility] [address], [place], [place] [address] [phone] www.ProfileWatcher.fi **Medicaid transportation is available to [facility] Health recipients who need assistance getting to [facility] medical appointments and providers  [facility] [address] [place] [address], [place], [place] [address] www.cjmedicaltransportation.com  ** Offers non-emergency transportation for medical appointments  Wheels [place], [place], [place] [address] [phone] www.wheels4hope.org **REFERRAL NEEDED by specific agencies (see website), after meeting specified criteria only  Mirant for [employer]) [address] [place], [place] [address] [phone]  BikerFestival.is  *Regional fixed-bus routes between counties (example: [place] to [place]) and Coca[employer] of [place], [place], [place] [address] [phone] Http://senior-resources-[place]ilford.org  Production assistant, radio available (call or see website for details)  [place] [address], [place], [place] [address] [phone] www.senioradults.org  **[place], Disability and [place] [address], [place], [place] [address] [phone] www.adtsrc.[place] Department of Health and [facility] [place], [place], [place] [address] [phone]  http://goodwin-walker.biz/  **Transportation expense assistance  Department of [place] [address] [place], [place], [place] [address] [phone] www.co.[place]ingham.[place].us/pview.aspx?id=14850&catid=407  **Medicaid transportation for recipients who need assistance getting to [facility] medical appointments and providers      If you are in need of transportation to get to and from your appointments in our office.  You can reach Transportation Services by calling [phone] Monday - Friday  7am-6pm.

## 2020-01-14 NOTE — Progress Notes (Signed)
Chart reviewed - agree with CMA/RN documentation.  ° °

## 2020-01-14 NOTE — Patient Instructions (Signed)
[facility]:  [address] 2nd floor, [place], [place] [address] [phone])  Main line [phone]  [facility] location  [facility]:  [address] [place], [place] [address] [phone]) Main line [phone]  [place] location   Walk-in hours: Monday -Friday 8:30am-4:30am  [email or web address]   If immediate emergency, call 9-1-1, or [facility] of the [place] 24 hour crisis hotline at: [place]:   What if I or someone I know is in crisis?  . If you are thinking about harming yourself or having thoughts of suicide, or if you know someone who is, seek help right away.  . Call your doctor or mental health care provider.  . Call 911 or go to a hospital emergency room to get immediate help, or ask a friend or family member to help you do these things.  . Call the [place] National Suicide Prevention Lifeline's toll-free, 24-hour hotline at 1-800-273-TALK 986-599-9997) or TTY: 1-800-799-4 TTY (682)103-1694) to talk to a trained counselor.  . If you are in crisis, make sure you are not left alone.   . If someone else is in crisis, make sure he or she is not left alone   24 Hour :   [place] National Suicide Hotline: (660)430-4245  Therapeutic [facility] Mobile Crisis: 910-198-9412   [facility]  [address], [place], [place] [address]  [phone] or [phone]  [facility] Service of the [employer] (Domestic Violence, Rape & Victim Assistance)  [phone]  [place]  [address],   [address]   [phone] or [phone]   [place]: [phone] (8am-4pm) or [phone] (after hours)           [facility], [address], [place], [place] Fax:  [phone] www.NailBuddies.[email or web address]  *Interpreters available *Accepts Medicaid, Medicare, uninsured  [facility]   Mon-Fri: 8am-5pm [address] [place], [place] [phone](phone); [phone]) BloggerCourse.com  *Accepts Medicare  [facility] [place], Fri: 8am-4pm [address] [place], [place] (phone); [phone] (fax) TaskTown.es  *[place] Mon-Fri: 9am-5pm  [address], [place], [place] (phone); [phone]  [email or web address].[email or web address]  *Accepts Medicaid  [facility] [address] [place], [place]  SalonLookup.es   [facility] the [place], 8:30am-12pm/1pm-2:30pm [address], [place], [place] (phone); [phone] (fax) www.fspcares.org  *Accepts Medicaid, sliding-scale*Bilingual services available  [facility] Mon-Fri, 8am-7pm [place], [place]  [phone](phone); [phone]) www.famsolutions.org  *Accepts Medicaid *Bilingual services available  [facility] Mon-Fri: 8am-5pm, Saturday by appointment only [place], [place], [place] (phone); [phone] (fax) www.[email or web address]   [facility] [address] [place], [place], [place] [email or web address]  *Free & reduced services for uninsured and underinsured individuals *Bilingual services for Spanish-speaking clients 21 and under  [facility], [address], [place], [place]); [phone]) RunningConvention.de  *Bring your own interpreter at first visit *Accepts Medicare and  [facility]  [place] Mon-Fri: 9am-5:30pm [address] [place], [place], [place] (phone), [phone] (fax) After hours crisis line: [phone] www.[email or web address]  *Accepts Medicare and Medicaid  [employer], 8am-6pm [address], [place], [place] (phone); [phone] (fax) http://presbyteriancounseling.org  *Subsidized costs available  [facility] Mon-Fri: 8am-4pm [address], [place], [place] [phone](phone); [phone]) www.psychotherapeuticservices.com  *Accepts Medicaid  [facility] Same day access hours: Mon-Fri, 8:30-3pm Crisis hours: Mon-Fri, 8am-5pm [place], [place] Same day access hours: Mon-Fri, 8:30-3pm Crisis hours: Mon-Fri, 8am-8pm [address], [place], [place] (phone); [phone] (fax) www.rhahealthservices.org  *Accepts Medicaid and Medicare  [facility], [place], Fri: 9am-9pm Tues, Thurs: 9am-6pm [place], [place], [place] (phone); [phone] (fax) https://[email or web address]  *(Accepts Medicare and Medicaid; payment plans available)*Bilingual services available  [facility] [address] [place], [place] (phone); [phone] (fax) [email or web address] [address] [place], [place], [place]  [email or web address].[email or web address]  *Bilingual services available  [facility]) Mon-Thurs: 8am-8pm [address] [place], [place], [place] (phone); [phone] (fax) LostMillions.com.pt  *Accepts Medicaid*Bilingual services available  [facility] [address] [place], [place], [place] (phone) DeadConnect.com.cy  *Accepts  Medicaid *Bilingual services available  [facility] Mon-Fri, 9am-4:45pm [address], [place], [place] (phone); [phone] (fax) http://tlc-[email or web address]  *Accepts Medicare  [facility] Mon-Thurs: 8:30-8pm, Fri: 8:30am-7pm [address] [place], [place] (3rd floor) [phone] (phone); [phone] (fax) VIPinterview.si  *Accepts Medicaid; income-based reduced rates available  [facility] Mon-Fri: 8am-5pm [address] [place], [place], [place] (phone); [phone] (fax) http://www.[email or web address]  *Accepts Medicaid*Bilingual services available  [place], [place], [place], [place] (phone); [phone] (fax) www.youthfocus.org  *Free emergency housing and clinical services for youth in crisis  [facility] ([place])  [address] [place] [phone] www.mhag.org  *Provides direct services to individuals in recovery from mental illness, including support groups, recovery skills classes, and one on one peer support  [facility] [employer] on [place]) [place] helpline: [phone]  https://namiguilford.org  *A community hub for information relating to local resources and services for the friends and families of individuals living alongside a mental health condition, as well as the individuals themselves. Classes and support groups also provided
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
## 2020-01-15 ENCOUNTER — Encounter: Payer: Self-pay | Admitting: Obstetrics & Gynecology

## 2020-01-15 LAB — OBSTETRIC PANEL, INCLUDING HIV
Antibody Screen: NEGATIVE
Basophils Absolute: 0 10*3/uL (ref 0.0–0.2)
Basos: 0 %
EOS (ABSOLUTE): 0 10*3/uL (ref 0.0–0.4)
Eos: 1 %
HIV Screen 4th Generation wRfx: NONREACTIVE
Hematocrit: 35.8 % (ref 34.0–46.6)
Hemoglobin: 11.7 g/dL (ref 11.1–15.9)
Hepatitis B Surface Ag: NEGATIVE
Immature Grans (Abs): 0 10*3/uL (ref 0.0–0.1)
Immature Granulocytes: 0 %
Lymphocytes Absolute: 1.6 10*3/uL (ref 0.7–3.1)
Lymphs: 24 %
MCH: 29 pg (ref 26.6–33.0)
MCHC: 32.7 g/dL (ref 31.5–35.7)
MCV: 89 fL (ref 79–97)
Monocytes Absolute: 0.5 10*3/uL (ref 0.1–0.9)
Monocytes: 8 %
Neutrophils Absolute: 4.4 10*3/uL (ref 1.4–7.0)
Neutrophils: 67 %
Platelets: 233 10*3/uL (ref 150–450)
RBC: 4.04 x10E6/uL (ref 3.77–5.28)
RDW: 13.1 % (ref 11.7–15.4)
RPR Ser Ql: NONREACTIVE
Rh Factor: POSITIVE
Rubella Antibodies, IGG: 2.02 index (ref >0.99)
WBC: 6.5 10*3/uL (ref 3.4–10.8)

## 2020-01-15 LAB — HEMOGLOBIN A1C
Est. average glucose Bld gHb Est-mCnc: 108 mg/dL
Hgb A1c MFr Bld: 5.4 % (ref 4.8–5.6)

## 2020-01-15 NOTE — BH Specialist Note (Signed)
Pt did not arrive to video visit and did not answer the phone ; Via in-person Miranda interpreter, Al[place],Left HIPPA-compliant message to call back Roselyn Reef from [employer] for [employer] at [facility] [phone] ([place] office).  ; left MyChart message for patient.    Andrea Barry via Telemedicine Video Visit  01/15/2020 Jilliana Burkes [phone]  [name]

## 2020-01-17 ENCOUNTER — Ambulatory Visit (INDEPENDENT_AMBULATORY_CARE_PROVIDER_SITE_OTHER): Payer: Medicaid Other | Admitting: Family Medicine

## 2020-01-17 ENCOUNTER — Other Ambulatory Visit: Payer: Self-pay

## 2020-01-17 ENCOUNTER — Other Ambulatory Visit (HOSPITAL_COMMUNITY)
Admission: RE | Admit: 2020-01-17 | Discharge: 2020-01-17 | Disposition: A | Discharge status: Home / Self Care | Payer: Medicaid Other | Source: Ambulatory Visit | Attending: Family Medicine | Admitting: Family Medicine

## 2020-01-17 VITALS — BP 119/67 | HR 81 | Wt 122.0 lb

## 2020-01-17 DIAGNOSIS — O09299 Supervision of pregnancy with other poor reproductive or obstetric history, unspecified trimester: Secondary | ICD-10-CM

## 2020-01-17 DIAGNOSIS — Z23 Encounter for immunization: Secondary | ICD-10-CM

## 2020-01-17 DIAGNOSIS — O09899 Supervision of other high risk pregnancies, unspecified trimester: Secondary | ICD-10-CM

## 2020-01-17 DIAGNOSIS — O09291 Supervision of pregnancy with other poor reproductive or obstetric history, first trimester: Secondary | ICD-10-CM

## 2020-01-17 DIAGNOSIS — Z789 Other specified health status: Secondary | ICD-10-CM

## 2020-01-17 DIAGNOSIS — O0990 Supervision of high risk pregnancy, unspecified, unspecified trimester: Secondary | ICD-10-CM | POA: Insufficient documentation

## 2020-01-17 DIAGNOSIS — O09891 Supervision of other high risk pregnancies, first trimester: Secondary | ICD-10-CM

## 2020-01-17 DIAGNOSIS — N766 Ulceration of vulva: Secondary | ICD-10-CM

## 2020-01-17 DIAGNOSIS — O99281 Endocrine, nutritional and metabolic diseases complicating pregnancy, first trimester: Secondary | ICD-10-CM

## 2020-01-17 DIAGNOSIS — Z3A11 11 weeks gestation of pregnancy: Secondary | ICD-10-CM

## 2020-01-17 DIAGNOSIS — F339 Major depressive disorder, recurrent, unspecified: Secondary | ICD-10-CM

## 2020-01-17 DIAGNOSIS — E038 Other specified hypothyroidism: Secondary | ICD-10-CM

## 2020-01-17 DIAGNOSIS — O99341 Other mental disorders complicating pregnancy, first trimester: Secondary | ICD-10-CM

## 2020-01-17 DIAGNOSIS — R87610 Atypical squamous cells of undetermined significance on cytologic smear of cervix (ASC-US): Secondary | ICD-10-CM

## 2020-01-17 DIAGNOSIS — O09211 Supervision of pregnancy with history of pre-term labor, first trimester: Secondary | ICD-10-CM

## 2020-01-17 MED ORDER — MICONAZOLE NITRATE 2 % VA CREA: 1.0000 | TOPICAL_CREAM | Freq: Every day | VAGINAL | 2 refills | Status: DC

## 2020-01-17 MED ORDER — BLOOD PRESSURE KIT DEVI: 1.0000 | Freq: Every day | Status: DC

## 2020-01-17 MED ORDER — SERTRALINE HCL 50 MG PO TABS: 50.0000 mg | ORAL_TABLET | Freq: Every day | ORAL | 1 refills | Status: DC
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
## 2020-01-18 ENCOUNTER — Telehealth (HOSPITAL_COMMUNITY): Payer: Self-pay | Admitting: *Deleted

## 2020-01-18 ENCOUNTER — Encounter (HOSPITAL_COMMUNITY): Payer: Self-pay | Admitting: *Deleted

## 2020-01-18 LAB — CERVICOVAGINAL ANCILLARY ONLY
Bacterial Vaginitis (gardnerella): POSITIVE — AB
Candida Glabrata: NEGATIVE
Candida Vaginitis: POSITIVE — AB
Chlamydia: NEGATIVE
Comment: NEGATIVE
Comment: NEGATIVE
Comment: NEGATIVE
Comment: NEGATIVE
Comment: NEGATIVE
Comment: NORMAL
Neisseria Gonorrhea: NEGATIVE
Trichomonas: NEGATIVE

## 2020-01-18 LAB — SPECIMEN STATUS REPORT

## 2020-01-18 LAB — CYTOLOGY - PAP
Comment: NEGATIVE
Diagnosis: UNDETERMINED — AB
High risk HPV: POSITIVE — AB

## 2020-01-18 LAB — TSH: TSH: 3.15 u[IU]/mL (ref 0.450–4.500)

## 2020-01-18 NOTE — Telephone Encounter (Signed)
Preadmission screen Interpreter number [phone] Instructed to arrive at 0700 on Tuesday for cerclage and NPO after midnight vis interpreter.  Verbalized understanding and all questions answered

## 2020-01-19 ENCOUNTER — Other Ambulatory Visit: Payer: Self-pay | Admitting: Obstetrics & Gynecology

## 2020-01-19 ENCOUNTER — Other Ambulatory Visit (HOSPITAL_COMMUNITY)
Admission: RE | Admit: 2020-01-19 | Discharge: 2020-01-19 | Disposition: A | Discharge status: Home / Self Care | Payer: Medicaid Other | Source: Ambulatory Visit | Attending: Obstetrics & Gynecology | Admitting: Obstetrics & Gynecology

## 2020-01-19 DIAGNOSIS — Z01812 Encounter for preprocedural laboratory examination: Secondary | ICD-10-CM | POA: Insufficient documentation

## 2020-01-19 DIAGNOSIS — Z20822 Contact with and (suspected) exposure to covid-19: Secondary | ICD-10-CM | POA: Insufficient documentation

## 2020-01-19 LAB — SARS CORONAVIRUS 2 (TAT 6-24 HRS): SARS Coronavirus 2: NEGATIVE

## 2020-01-20 LAB — HERPES SIMPLEX VIRUS CULTURE

## 2020-01-20 LAB — URINE CULTURE, OB REFLEX

## 2020-01-20 LAB — CULTURE, OB URINE

## 2020-01-21 ENCOUNTER — Telehealth (INDEPENDENT_AMBULATORY_CARE_PROVIDER_SITE_OTHER): Payer: Medicaid Other | Admitting: Lactation Services

## 2020-01-21 DIAGNOSIS — R87610 Atypical squamous cells of undetermined significance on cytologic smear of cervix (ASC-US): Secondary | ICD-10-CM

## 2020-01-21 MED ORDER — FLUCONAZOLE 150 MG PO TABS
150.0000 mg | ORAL_TABLET | Freq: Once | ORAL | 0 refills | Status: AC
Start: 2020-01-21 — End: 2020-01-21

## 2020-01-21 MED ORDER — TINIDAZOLE 500 MG PO TABS
2.0000 g | ORAL_TABLET | Freq: Every day | ORAL | 2 refills | Status: DC
Start: 2020-01-21 — End: 2020-06-03

## 2020-01-21 NOTE — Telephone Encounter (Signed)
Called patient with assistance of [place] Telephone [place], [place] # [phone].   Informed patient that she is positive for ASCUS with + HPV and Colposcopy at her next appointment.   Reviewed that she also has BV and yeast. Patient informed medications have been sent to her Pharmacy. Patient reports understanding.

## 2020-01-21 NOTE — Telephone Encounter (Signed)
-----   Message from Truett Mainland, DO sent at 01/21/2020  3:56 PM EDT ----- Has ASCUS, +HPV. Needs colposcopy with next appointment. She also has BV and yeast. Prescription for diflucan and tindamax sent to pharmacy.

## 2020-01-21 NOTE — Addendum Note (Signed)
Addended by: Truett Mainland on: 01/21/2020 03:57 PM   Modules accepted: Orders

## 2020-01-22 ENCOUNTER — Other Ambulatory Visit: Payer: Self-pay

## 2020-01-22 ENCOUNTER — Other Ambulatory Visit: Payer: Self-pay | Admitting: Obstetrics & Gynecology

## 2020-01-22 ENCOUNTER — Encounter (HOSPITAL_COMMUNITY)
Admission: RE | Disposition: A | Discharge status: Home / Self Care | Payer: Self-pay | Source: Home / Self Care | Attending: Obstetrics & Gynecology

## 2020-01-22 ENCOUNTER — Ambulatory Visit (HOSPITAL_COMMUNITY)
Admission: RE | Admit: 2020-01-22 | Discharge: 2020-01-22 | Disposition: A | Discharge status: Home / Self Care | Payer: Medicaid Other | Attending: Obstetrics & Gynecology | Admitting: Obstetrics & Gynecology

## 2020-01-22 ENCOUNTER — Inpatient Hospital Stay (HOSPITAL_COMMUNITY): Payer: Medicaid Other | Admitting: Anesthesiology

## 2020-01-22 ENCOUNTER — Encounter (HOSPITAL_COMMUNITY): Payer: Self-pay | Admitting: Obstetrics & Gynecology

## 2020-01-22 DIAGNOSIS — O3431 Maternal care for cervical incompetence, first trimester: Secondary | ICD-10-CM | POA: Diagnosis present

## 2020-01-22 DIAGNOSIS — O09891 Supervision of other high risk pregnancies, first trimester: Secondary | ICD-10-CM

## 2020-01-22 DIAGNOSIS — O99282 Endocrine, nutritional and metabolic diseases complicating pregnancy, second trimester: Secondary | ICD-10-CM | POA: Insufficient documentation

## 2020-01-22 DIAGNOSIS — O23591 Infection of other part of genital tract in pregnancy, first trimester: Secondary | ICD-10-CM | POA: Insufficient documentation

## 2020-01-22 DIAGNOSIS — O09211 Supervision of pregnancy with history of pre-term labor, first trimester: Secondary | ICD-10-CM | POA: Insufficient documentation

## 2020-01-22 DIAGNOSIS — Z3A12 12 weeks gestation of pregnancy: Secondary | ICD-10-CM | POA: Diagnosis not present

## 2020-01-22 DIAGNOSIS — O99343 Other mental disorders complicating pregnancy, third trimester: Secondary | ICD-10-CM | POA: Insufficient documentation

## 2020-01-22 DIAGNOSIS — O09899 Supervision of other high risk pregnancies, unspecified trimester: Secondary | ICD-10-CM

## 2020-01-22 DIAGNOSIS — O3432 Maternal care for cervical incompetence, second trimester: Secondary | ICD-10-CM | POA: Diagnosis present

## 2020-01-22 DIAGNOSIS — F339 Major depressive disorder, recurrent, unspecified: Secondary | ICD-10-CM | POA: Diagnosis not present

## 2020-01-22 DIAGNOSIS — E039 Hypothyroidism, unspecified: Secondary | ICD-10-CM | POA: Insufficient documentation

## 2020-01-22 HISTORY — PX: CERVICAL CERCLAGE: SHX1329

## 2020-01-22 LAB — CBC
HCT: 32.1 % — ABNORMAL LOW (ref 36.0–46.0)
Hemoglobin: 10.4 g/dL — ABNORMAL LOW (ref 12.0–15.0)
MCH: 28.7 pg (ref 26.0–34.0)
MCHC: 32.4 g/dL (ref 30.0–36.0)
MCV: 88.7 fL (ref 80.0–100.0)
Platelets: 210 10*3/uL (ref 150–400)
RBC: 3.62 MIL/uL — ABNORMAL LOW (ref 3.87–5.11)
RDW: 12.9 % (ref 11.5–15.5)
WBC: 5.9 10*3/uL (ref 4.0–10.5)
nRBC: 0 % (ref 0.0–0.2)

## 2020-01-22 SURGERY — CERCLAGE, CERVIX, VAGINAL APPROACH
Anesthesia: Spinal | Site: Vagina | Wound class: Clean Contaminated

## 2020-01-22 MED ORDER — FENTANYL CITRATE (PF) 100 MCG/2ML IJ SOLN: 25.0000 ug | INTRAMUSCULAR | Status: DC | PRN

## 2020-01-22 MED ORDER — FENTANYL CITRATE (PF) 100 MCG/2ML IJ SOLN
INTRAMUSCULAR | Status: DC | PRN
  Administered 2020-01-22 (×2): 25 ug via INTRAVENOUS

## 2020-01-22 MED ORDER — OXYCODONE-ACETAMINOPHEN 5-325 MG PO TABS: 1.0000 | ORAL_TABLET | Freq: Four times a day (QID) | ORAL | 0 refills | Status: DC | PRN

## 2020-01-22 MED ORDER — OXYCODONE HCL 5 MG/5ML PO SOLN: 5.0000 mg | Freq: Once | ORAL | Status: DC | PRN

## 2020-01-22 MED ORDER — SODIUM CHLORIDE 0.9 % IR SOLN
Status: DC | PRN
  Administered 2020-01-22: 1000 mL

## 2020-01-22 MED ORDER — CHLOROPROCAINE HCL 50 MG/5ML IT SOLN
INTRATHECAL | Status: AC
  Filled 2020-01-22: qty 5

## 2020-01-22 MED ORDER — ONDANSETRON HCL 4 MG/2ML IJ SOLN
INTRAMUSCULAR | Status: DC | PRN
Start: 2020-01-22 — End: 2020-01-22
  Administered 2020-01-22: 4 mg via INTRAVENOUS

## 2020-01-22 MED ORDER — ACETAMINOPHEN 160 MG/5ML PO SOLN: 325.0000 mg | ORAL | Status: DC | PRN

## 2020-01-22 MED ORDER — ACETAMINOPHEN 325 MG PO TABS: 325.0000 mg | ORAL_TABLET | ORAL | Status: DC | PRN

## 2020-01-22 MED ORDER — FENTANYL CITRATE (PF) 100 MCG/2ML IJ SOLN
INTRAMUSCULAR | Status: AC
  Filled 2020-01-22: qty 2

## 2020-01-22 MED ORDER — CHLOROPROCAINE HCL (PF) 3 % IJ SOLN
INTRAMUSCULAR | Status: DC | PRN
  Administered 2020-01-22 (×2): 50 mg

## 2020-01-22 MED ORDER — ONDANSETRON HCL 4 MG/2ML IJ SOLN
INTRAMUSCULAR | Status: AC
  Filled 2020-01-22: qty 2

## 2020-01-22 MED ORDER — OXYCODONE HCL 5 MG PO TABS: 5.0000 mg | ORAL_TABLET | Freq: Once | ORAL | Status: DC | PRN

## 2020-01-22 MED ORDER — LACTATED RINGERS IV SOLN: INTRAVENOUS | Status: DC

## 2020-01-22 SURGICAL SUPPLY — 16 items
CANISTER SUCT 3000ML PPV (MISCELLANEOUS) IMPLANT
GLOVE BIO SURGEON STRL SZ 6.5 (GLOVE) ×2 IMPLANT
GLOVE BIO SURGEONS STRL SZ 6.5 (GLOVE) ×1
GLOVE BIOGEL PI IND STRL 7.0 (GLOVE) ×2 IMPLANT
GLOVE BIOGEL PI INDICATOR 7.0 (GLOVE) ×4
GOWN STRL REUS W/TWL LRG LVL3 (GOWN DISPOSABLE) ×6 IMPLANT
NS IRRIG 1000ML POUR BTL (IV SOLUTION) ×3 IMPLANT
PACK VAGINAL MINOR WOMEN LF (CUSTOM PROCEDURE TRAY) ×3 IMPLANT
PAD OB MATERNITY 4.3X12.25 (PERSONAL CARE ITEMS) ×3 IMPLANT
PAD PREP 24X48 CUFFED NSTRL (MISCELLANEOUS) ×3 IMPLANT
SUT PROLENE 1 CT 1 30 (SUTURE) ×3 IMPLANT
TOWEL OR 17X24 6PK STRL BLUE (TOWEL DISPOSABLE) ×6 IMPLANT
TRAY FOLEY W/BAG SLVR 14FR (SET/KITS/TRAYS/PACK) ×3 IMPLANT
TUBING NON-CON 1/4 X 20 CONN (TUBING) IMPLANT
TUBING NON-CON 1/4 X 20' CONN (TUBING)
YANKAUER SUCT BULB TIP NO VENT (SUCTIONS) IMPLANT

## 2020-01-22 NOTE — Anesthesia Procedure Notes (Signed)
Spinal  Patient location during procedure: OR Start time: 01/22/2020 9:15 AM End time: 01/22/2020 9:19 AM Staffing Performed: anesthesiologist  Anesthesiologist: Lyn Hollingshead, MD Preanesthetic Checklist Completed: patient identified, IV checked, site marked, risks and benefits discussed, surgical consent, monitors and equipment checked, pre-op evaluation and timeout performed Spinal Block Patient position: sitting Prep: DuraPrep and site prepped and draped Patient monitoring: continuous pulse ox and blood pressure Approach: midline Location: L3-4 Injection technique: single-shot Needle Needle type: Pencan  Needle gauge: 24 G Needle length: 10 cm Needle insertion depth: 3 cm Assessment Sensory level: T10 Events: other event Additional Notes Introducer placed by the SRNA to its hub may have breached the Dura?

## 2020-01-22 NOTE — Anesthesia Postprocedure Evaluation (Signed)
Anesthesia Post Note  Patient: Andrea Barry  Procedure(s) Performed: CERCLAGE CERVICAL (N/A Vagina )     Patient location during evaluation: PACU Anesthesia Type: Spinal Level of consciousness: awake Pain management: pain level controlled Vital Signs Assessment: post-procedure vital signs reviewed and stable Respiratory status: spontaneous breathing Cardiovascular status: stable Postop Assessment: no headache, spinal receding, patient able to bend at knees and no apparent nausea or vomiting Anesthetic complications: no   No complications documented.  Last Vitals:  Vitals:   01/22/20 1130 01/22/20 1145  BP: 106/65 102/67  Pulse: 70 83  Resp: 17 16  Temp:  36.8 C  SpO2: 100% 100%    Last Pain:  Vitals:   01/22/20 1145  TempSrc: Oral   Pain Goal:    LLE Motor Response: Purposeful movement (01/22/20 1145)   RLE Motor Response: Purposeful movement (01/22/20 1145)       Epidural/Spinal Function Cutaneous sensation: Able to Discern Pressure (01/22/20 1145), Patient able to flex knees: Yes (01/22/20 1145), Patient able to lift hips off bed: Yes (01/22/20 1145), Back pain beyond tenderness at insertion site: No (01/22/20 1145), Progressively worsening motor and/or sensory loss: No (01/22/20 1145), Bowel and/or bladder incontinence post epidural: No (01/22/20 1145)  Huston Foley

## 2020-01-22 NOTE — Anesthesia Preprocedure Evaluation (Signed)
Anesthesia Evaluation  Patient identified by MRN, date of birth, ID band Patient awake    Reviewed: Allergy & Precautions, NPO status , Patient's Chart, lab work & pertinent test results  Airway Mallampati: I  TM Distance: >3 FB Neck ROM: Full    Dental  (+) Teeth Intact   Pulmonary neg pulmonary ROS,    Pulmonary exam normal breath sounds clear to auscultation       Cardiovascular Exercise Tolerance: Good negative cardio ROS Normal cardiovascular exam Rhythm:Regular Rate:Normal     Neuro/Psych negative neurological ROS  negative psych ROS   GI/Hepatic negative GI ROS, Neg liver ROS,   Endo/Other  Hypothyroidism   Renal/GU negative Renal ROS     Musculoskeletal negative musculoskeletal ROS (+)   Abdominal Normal abdominal exam  (+)   Peds  Hematology  (+) Blood dyscrasia, anemia , Plt 230k    Anesthesia Other Findings   Reproductive/Obstetrics (+) Pregnancy Short cervix                              Anesthesia Physical  Anesthesia Plan  ASA: II  Anesthesia Plan: Spinal   Post-op Pain Management:    Induction:   PONV Risk Score and Plan: 2 and Treatment may vary due to age or medical condition  Airway Management Planned: Natural Airway and Nasal Cannula  Additional Equipment: None  Intra-op Plan:   Post-operative Plan:   Informed Consent: I have reviewed the patients History and Physical, chart, labs and discussed the procedure including the risks, benefits and alternatives for the proposed anesthesia with the patient or authorized representative who has indicated his/her understanding and acceptance.     Dental advisory given  Plan Discussed with: CRNA, Anesthesiologist and Surgeon  Anesthesia Plan Comments: (. Patient consents to spinal via interpreter.)        Anesthesia Quick Evaluation

## 2020-01-22 NOTE — OR Nursing (Signed)
Foley discontinued pt instructed she has 6hrs to void on her own. IV discontinued tip intact. Pt ambulated. Discharge instructions given .

## 2020-01-22 NOTE — Transfer of Care (Signed)
Immediate Anesthesia Transfer of Care Note  Patient: Andrea Barry  Procedure(s) Performed: CERCLAGE CERVICAL (N/A Vagina )  Patient Location: PACU  Anesthesia Type:Spinal  Level of Consciousness: awake, alert  and oriented  Airway & Oxygen Therapy: Patient Spontanous Breathing  Post-op Assessment: Report given to RN and Post -op Vital signs reviewed and stable  Post vital signs: Reviewed and stable  Last Vitals:  Vitals Value Taken Time  BP 103/58 01/22/20 0950  Temp    Pulse 66 01/22/20 0953  Resp 13 01/22/20 0953  SpO2 100 % 01/22/20 0953  Vitals shown include unvalidated device data.  Last Pain:  Vitals:   01/22/20 0728  TempSrc: Oral         Complications: No complications documented.

## 2020-01-22 NOTE — Anesthesia Procedure Notes (Deleted)
Spinal  Patient location during procedure: OR Start time: 01/22/2020 9:18 AM End time: 01/22/2020 9:21 AM Staffing Performed: other anesthesia staff  Other anesthesia staff: Antonieta Pert, RN Preanesthetic Checklist Completed: patient identified, IV checked, site marked, risks and benefits discussed, surgical consent, monitors and equipment checked, pre-op evaluation and timeout performed Spinal Block Patient position: sitting Prep: Betasept Patient monitoring: heart rate, continuous pulse ox and blood pressure Approach: midline Location: L3-4 Injection technique: single-shot Needle Needle type: Pencan  Needle gauge: 24 G Needle length: 10 cm Assessment Sensory level: T8

## 2020-01-22 NOTE — Op Note (Signed)
Andrea Barry   PROCEDURE DATE: 01/22/2020  PREOPERATIVE DIAGNOSIS: Intrauterine pregnancy at [redacted]w[redacted]d, history of cervical incompetence   POSTOPERATIVE DIAGNOSIS: The same PROCEDURE: Transvaginal [place] Cervical Cerclage Placement SURGEON:  Woodroe Mode, MD  INDICATIONS: 34 y.o. [id number] at [redacted]w[redacted]d with history of cervical incompetence, here for cerclage placement.   The risks of surgery were discussed in detail with the patient including but not limited to: bleeding; infection which may require antibiotic therapy; injury to cervix, vagina other surrounding organs; risk of ruptured membranes and/or preterm delivery and other postoperative or anesthesia complications.  Written informed consent was obtained.    FINDINGS:  About 2 cm palpable cervical length in the vagina, closed cervix, suture knot placed anteriorly.  ANESTHESIA:  Spinal INTRAVENOUS FLUIDS: 1000  ml ESTIMATED BLOOD LOSS: 15 ml COMPLICATIONS: None immediate  PROCEDURE IN DETAIL:  The patienthad sequential compression devices applied to her lower extremities while in the preoperative area.  Reassuring fetal heart rate was also obtained using a doppler. She was then taken to the operating room where spinal anesthesia was administered and was found to be adequate.  She was placed in the dorsal lithotomy, and was prepped and draped in a sterile manner. Her bladder was catheterized for an unmeasured amount of clear, yellow urine.  After an adequate timeout was performed, a vaginal speculum was then placed in the patient's vagina.  The anterior and posterior lips of the cervix were grasped with ring forceps. A curved needle with a 1-0 Prolene suture was inserted at 12 o'clock, as high as possible at the junction of the rugated vaginal epithelium and the smooth cervix, at least 2 cm above the external os.  Four bites are taken circumferentially around the entire cervix in a purse-string fashion, each bite should be deep enough to extend at  least midway into the cervical stroma, but not into the endocervical canal. The two ends of the suture were then tied securely anteriorly and cut, leaving the ends long enough to grasp with a clamp when it is time to remove it.There was minimal bleeding noted and the ring forceps were removed with good hemostasis noted.  No immediate complications noted.  All instruments were removed from the patient's vagina.Instrument, needle and sponge counts were correct x 2. The patient tolerated the procedure well, and was taken to the recovery area awake and in stable condition.  Reassuring fetal heart rate was also obtained using a doppler in the recovery area.  The patient will be discharged to home as per PACU criteria.  Routine postoperative instructions given.  She was prescribed Percocet and Colace.  She will follow up in the clinic as scheduled for postoperative evaluation and ongoing prenatal care.  Woodroe Mode, MD, [place], [facility] for [facility], [place]

## 2020-01-22 NOTE — Discharge Instructions (Signed)
Cerclaje cervical, cuidados posteriores Cervical Cerclage, Care After Esta hoja le brinda informacin sobre cmo cuidarse despus del procedimiento. Su mdico tambin podr darle instrucciones ms especficas. Comunquese con el mdico si tiene problemas o preguntas. Qu puedo esperar despus del procedimiento? Despus del procedimiento, es comn tener los siguientes sntomas:  Calambres en el abdomen.  Secrecin de mucosidad por la vagina. [place].  Dolor al Garment/textile technologist (disuria).  Manchas o pequeas gotas de sangre que salen de la vagina. Siga estas instrucciones en su casa:  Medicamentos  Use los medicamentos de venta libre y los recetados solamente como se lo haya indicado el mdico.  Pregntele al mdico si el medicamento recetado le impide conducir o usar maquinaria pesada. Instrucciones generales  Si le indicaron que haga reposo en la cama, siga las instrucciones del mdico. Es posible que deba hacer reposo en cama por [place].  Controle la secrecin vaginal y verifique si hay algn cambio. Si los hay, informe a su [place] actividades y los ejercicios fsicos hasta que el mdico lo autorice. Pregntele al mdico qu actividades son seguras para usted.  No tenga relaciones sexuales ni use duchas vaginales hasta que el mdico le diga que puede [place].  Concurra a todas las visitas de seguimiento, incluidas las visitas prenatales, como se lo haya indicado el mdico. Esto es importante. ? Las visitas prenatales son toda la asistencia mdica que recibe antes del nacimiento del beb. ? Tambin puede necesitar una ecografa.  Es posible que le pidan que concurra a visitas semanales para que le controlen el cuello uterino. Comunquese con un mdico si:  Tiene una secrecin anormal por la vagina, por ejemplo, cogulos.  Tiene secrecin vaginal con mal olor.  Tiene una erupcin cutnea. Esto puede ser enrojecimiento o hinchazn.  Se siente  mareada o como si se fuera a desmayarse.  Tiene dolor abdominal que no se alivia con medicamentos.  Tiene nuseas o vmitos que no desaparecen. Busque ayuda de inmediato si:  Tiene sangrado vaginal ms abundante o ms frecuente que un manchado.  Pierde lquido o rompe la bolsa de las aguas.  Tiene fiebre o escalofros.  Se desmaya.  Tiene contracciones uterinas. Estas pueden sentirse como lo siguiente: ? Dolor de espalda. ? Dolor en la zona inferior del abdomen. ? Calambres leves, similares a los [employer]. ? Tirantez o presin en el abdomen.  Siente que el beb no se mueve tanto como de costumbre o no puede sentir los movimientos del beb.  Siente dolor en el pecho.  Le falta el aire. Resumen  Despus del procedimiento, es comn tener clicos, secrecin vaginal, dolor al Garment/textile technologist y pequeas gotas de sangre que salen de la vagina.  Si le indicaron que haga reposo en la cama, siga las instrucciones del mdico. Es posible que deba hacer reposo en cama por [place].  Controle la secrecin vaginal y verifique si hay algn cambio. Si los hay, informe a su mdico.  Comunquese con un mdico si tiene secrecin vaginal anormal, se siente mareada o tiene dolor que no puede controlar con medicamentos.  Obtenga ayuda de inmediato si tiene hemorragia vaginal abundante, rompe la bolsa de aguas o tiene [place] uterinas. [place], busque ayuda de inmediato si el beb no se mueve tanto como de costumbre o si usted Tree surgeon en el pecho o le falta el aire. Esta informacin no tiene Marine scientist el consejo del mdico. Asegrese de hacerle al mdico cualquier pregunta que  tenga. Document Revised: 05/23/2019 Document Reviewed: 05/23/2019 Elsevier Patient Education  [place].

## 2020-01-22 NOTE — Interval H&P Note (Signed)
History and Physical Interval Note:  01/22/2020 8:39 AM  Andrea Barry  has presented today for surgery, with the diagnosis of incompetent cervix.  The various methods of treatment have been discussed with the patient and family. After consideration of risks, benefits and other options for treatment, the patient has consented to  Procedure(s): CERCLAGE CERVICAL (N/A) as a surgical intervention.  The patient's history has been reviewed, patient examined, no change in status, stable for surgery.  I have reviewed the patient's chart and labs.  Questions were answered to the patient's satisfaction.   The risk of bleeding, ROM, early pregnancy loss was discussed. Interpreter was present for counseling.  Emeterio Reeve

## 2020-01-24 NOTE — Discharge Summary (Signed)
Gynecology Physician Postoperative Discharge Summary  Patient ID: Andrea Barry MRN: [id number] DOB/AGE: September 26, 1985 34 y.o.  Admit Date: 01/22/2020 Discharge Date: 01/22/2020   Preoperative Diagnoses: Incompetent cervix  Procedures: Procedure(s) (LRB): CERCLAGE CERVICAL (N/A)  Hospital Course:  Andrea Barry is a 34 y.o. [id number]  admitted for scheduled surgery.  She underwent the procedures as mentioned above, her operation was uncomplicated. For further details about surgery, please refer to the operative report. Patient had an uncomplicated postoperative course. By time of discharge her pain was controlled on oral pain medications; she was ambulating, voiding without difficulty, tolerating regular diet and passing flatus. She was deemed stable for discharge to home.   Significant Labs: CBC Latest Ref Rng & Units 01/22/2020 01/14/2020 04/21/2018  WBC 4.0 - 10.5 K/uL 5.9 6.5 13.3(H)  Hemoglobin 12.0 - 15.0 g/dL 10.4(L) 11.7 9.4(L)  Hematocrit 36 - 46 % 32.1(L) 35.8 29.5(L)  Platelets 150 - 400 K/uL 210 233 187    Discharge Exam: Blood pressure 102/67, pulse 83, temperature 98.2 F (36.8 C), temperature source Oral, resp. rate 16, height _0  (1.626 m), weight 54.8 kg, last menstrual period 10/26/2019, SpO2 100 %, currently breastfeeding. General appearance: alert and no distress  Resp: clear to auscultation bilaterally  Cardio: regular rate and rhythm  GI: soft, non-tender; bowel sounds normal; no masses, no organomegaly.  Incision: C/D/I, no erythema, no drainage noted Pelvic: scant blood on pad  Extremities: extremities normal, atraumatic, no cyanosis or edema and Homans sign is negative, no sign of DVT  Discharged Condition: Stable  Disposition: Discharge disposition: 01-Home or Self Care       Discharge Instructions    Discharge patient   Complete by: As directed    Discharge disposition: 01-Home or Self Care   Discharge patient date: 01/22/2020     Allergies  as of 01/22/2020      Reactions   Metronidazole Other (See Comments)   Dizziness, taste of iron in mouth, and darkened urine      Medication List    TAKE these medications   Blood Pressure Kit Devi 1 Device by Does not apply route daily. ICD 10: Z34.00   miconazole 2 % vaginal cream Commonly known as: MONISTAT 7 Place 1 Applicatorful vaginally at bedtime. Apply for seven nights   multivitamin-prenatal 27-0.8 MG Tabs tablet Take 1 tablet by mouth daily at 12 noon. What changed: when to take this   oxyCODONE-acetaminophen 5-325 MG tablet Commonly known as: PERCOCET/ROXICET Take 1-2 tablets by mouth every 6 (six) hours as needed.   sertraline 50 MG tablet Commonly known as: Zoloft Take 1 tablet (50 mg total) by mouth daily.   tinidazole 500 MG tablet Commonly known as: TINDAMAX Take 4 tablets (2,000 mg total) by mouth daily with breakfast. For two days       Follow-up [place] for [facility] On 01/31/2020.   Specialty: Obstetrics and Gynecology Contact information: [place] [address] [phone]              Signed: Woodroe Mode, MD, [place], [place]

## 2020-01-25 ENCOUNTER — Encounter: Payer: Self-pay | Admitting: *Deleted

## 2020-01-28 ENCOUNTER — Ambulatory Visit: Payer: Medicaid Other | Admitting: Clinical

## 2020-01-28 DIAGNOSIS — Z91199 Patient's noncompliance with other medical treatment and regimen due to unspecified reason: Secondary | ICD-10-CM

## 2020-01-31 ENCOUNTER — Encounter: Payer: Medicaid Other | Admitting: Obstetrics & Gynecology

## 2020-02-20 ENCOUNTER — Encounter: Payer: Medicaid Other | Admitting: Obstetrics and Gynecology

## 2020-02-20 ENCOUNTER — Encounter (INDEPENDENT_AMBULATORY_CARE_PROVIDER_SITE_OTHER): Payer: Self-pay

## 2020-02-20 ENCOUNTER — Encounter: Payer: Self-pay | Admitting: Obstetrics and Gynecology

## 2020-03-10 ENCOUNTER — Ambulatory Visit: Payer: Medicaid Other

## 2020-03-10 ENCOUNTER — Ambulatory Visit: Payer: Medicaid Other | Attending: Obstetrics and Gynecology

## 2020-06-03 ENCOUNTER — Encounter (HOSPITAL_COMMUNITY): Payer: Self-pay | Admitting: Obstetrics & Gynecology

## 2020-06-03 ENCOUNTER — Inpatient Hospital Stay (HOSPITAL_COMMUNITY)
Admission: AD | Admit: 2020-06-03 | Discharge: 2020-06-03 | Disposition: A | Discharge status: Home / Self Care | Payer: Medicaid Other | Attending: Obstetrics & Gynecology | Admitting: Obstetrics & Gynecology

## 2020-06-03 DIAGNOSIS — T43226A Underdosing of selective serotonin reuptake inhibitors, initial encounter: Secondary | ICD-10-CM | POA: Diagnosis not present

## 2020-06-03 DIAGNOSIS — F419 Anxiety disorder, unspecified: Secondary | ICD-10-CM | POA: Insufficient documentation

## 2020-06-03 DIAGNOSIS — Z91128 Patient's intentional underdosing of medication regimen for other reason: Secondary | ICD-10-CM | POA: Diagnosis not present

## 2020-06-03 DIAGNOSIS — Z3A31 31 weeks gestation of pregnancy: Secondary | ICD-10-CM | POA: Diagnosis not present

## 2020-06-03 DIAGNOSIS — F32A Depression, unspecified: Secondary | ICD-10-CM | POA: Insufficient documentation

## 2020-06-03 DIAGNOSIS — O99343 Other mental disorders complicating pregnancy, third trimester: Secondary | ICD-10-CM | POA: Diagnosis not present

## 2020-06-03 DIAGNOSIS — Z3689 Encounter for other specified antenatal screening: Secondary | ICD-10-CM

## 2020-06-03 DIAGNOSIS — O26893 Other specified pregnancy related conditions, third trimester: Secondary | ICD-10-CM | POA: Insufficient documentation

## 2020-06-03 DIAGNOSIS — R102 Pelvic and perineal pain: Secondary | ICD-10-CM

## 2020-06-03 DIAGNOSIS — O3433 Maternal care for cervical incompetence, third trimester: Secondary | ICD-10-CM | POA: Diagnosis not present

## 2020-06-03 DIAGNOSIS — O0933 Supervision of pregnancy with insufficient antenatal care, third trimester: Secondary | ICD-10-CM | POA: Diagnosis not present

## 2020-06-03 LAB — WET PREP, GENITAL
Clue Cells Wet Prep HPF POC: NONE SEEN
Sperm: NONE SEEN
Trich, Wet Prep: NONE SEEN
Yeast Wet Prep HPF POC: NONE SEEN

## 2020-06-03 LAB — URINALYSIS, ROUTINE W REFLEX MICROSCOPIC
Bilirubin Urine: NEGATIVE
Glucose, UA: NEGATIVE mg/dL
Hgb urine dipstick: NEGATIVE
Ketones, ur: NEGATIVE mg/dL
Leukocytes,Ua: NEGATIVE
Nitrite: NEGATIVE
Protein, ur: NEGATIVE mg/dL
Specific Gravity, Urine: 1.013 (ref 1.005–1.030)
pH: 7 (ref 5.0–8.0)

## 2020-06-03 MED ORDER — SERTRALINE HCL 50 MG PO TABS: 50.0000 mg | ORAL_TABLET | Freq: Every day | ORAL | 2 refills | Status: DC

## 2020-06-03 MED ORDER — COMFORT FIT MATERNITY SUPP MED MISC: 1.0000 | Freq: Every day | 0 refills | Status: DC

## 2020-06-03 NOTE — Discharge Instructions (Signed)
PREGNANCY SUPPORT BELT: You are not alone, Seventy-five percent of women have some sort of abdominal or back pain at some point in their pregnancy. Your baby is growing at a fast pace, which means that your whole body is rapidly trying to adjust to the changes. As your uterus grows, your back may start feeling a bit under stress and this can result in back or abdominal pain that can go from mild, and therefore bearable, to severe pains that will not allow you to sit or lay down comfortably, When it comes to dealing with pregnancy-related pains and cramps, some pregnant women usually prefer natural remedies, which the market is filled with nowadays. For example, wearing a pregnancy support belt can help ease and lessen your discomfort and pain. WHAT ARE THE BENEFITS OF WEARING A PREGNANCY SUPPORT BELT? A pregnancy support belt provides support to the lower portion of the belly taking some of the weight of the growing uterus and distributing to the other parts of your body. It is designed make you comfortable and gives you extra support. Over the years, the pregnancy apparel market has been studying the needs and wants of pregnant women and they have come up with the most comfortable pregnancy support belts that woman could ever ask for. In fact, you will no longer have to wear a stretched-out or bulky pregnancy belt that is visible underneath your clothes and makes you feel even more uncomfortable. Nowadays, a pregnancy support belt is made of comfortable and stretchy materials that will not irritate your skin but will actually make you feel at ease and you will not even notice you are wearing it. They are easy to put on and adjust during the day and can be worn at night for additional support.  BENEFITS:  Relives Back pain  Relieves Abdominal Muscle and Leg Pain  Stabilizes the Pelvic Ring  Offers a Cushioned Abdominal Lift Pad  Relieves pressure on the Sciatic Nerve Within Minutes WHERE TO GET  YOUR PREGNANCY BELT: [employer] [phone] [address]  [place], [place] [address]   Abdominal Pain During Pregnancy  Belly (abdominal) pain is common during pregnancy. There are many possible causes. Most of the time, it is not a serious problem. Other times, it can be a sign that something is wrong with the pregnancy. Always tell your doctor if you have belly pain. Follow these instructions at home:  Do not have sex or put anything in your vagina until your pain goes away completely.  Get plenty of rest until your pain gets better.  Drink enough fluid to keep your pee (urine) pale yellow.  Take over-the-counter and prescription medicines only as told by your doctor.  Keep all follow-up visits as told by your doctor. This is important. Contact a doctor if:  Your pain continues or gets worse after resting.  You have lower belly pain that: ? Comes and goes at regular times. ? Spreads to your back. ? Feels like menstrual cramps.  You have pain or burning when you pee (urinate). Get help right away if:  You have a fever or chills.  You have vaginal bleeding.  You are leaking fluid from your vagina.  You are passing tissue from your vagina.  You throw up (vomit) for more than 24 hours.  You have watery poop (diarrhea) for more than 24 hours.  Your baby is moving less than usual.  You feel very weak or faint.  You have shortness of breath.  You have very bad pain in your upper belly. Summary  Belly (abdominal) pain is common during pregnancy. There are many possible causes.  If you have belly pain during pregnancy, tell your doctor right away.  Keep all follow-up visits as told by your doctor. This is important. This information is not intended to replace advice given to you by your health care provider. Make sure you discuss any questions you have with your health care provider. Document Revised: 11/13/2018 Document Reviewed:  10/28/2016 Elsevier Patient Education  [place].

## 2020-06-03 NOTE — MAU Provider Note (Signed)
History     CSN: [id number]  Arrival date and time: 06/03/20 1801   First Provider Initiated Contact with Patient 06/03/20 1918      Chief Complaint  Patient presents with  . Abdominal Pain   34 y.o. [id number] @31 .4 wks presenting with pelvic pain. Reports onset since 22 weeks but became worse today. Pain occurs with standing and ambulation. Pain improves when she is sitting or lying down. She describes pain like menstrual cramps. Rates pain 10/10. Has not taken anything for it. Denies VB, LOF, and ctx. Reports good FM. She has not had care since June when she had a cerclage placed at 12 weeks. Hx of anxiety and depression and reports she was taking Zoloft but she ran out and since that time has not wanted to leave her house. No SI. Also state's she does not have transportation to get to prenatal visits.   OB History    Gravida  5   Para  2   Term  1   Preterm  1   AB  2   Living  1     SAB  2   TAB  0   Ectopic  0   Multiple  0   Live Births  1           Past Medical History:  Diagnosis Date  . Allergy   . Anemia   . Cervical incompetence    2nd trimester fetal loss  . Cervical incompetence in pregnancy, second trimester 10/13/2017   21 week loss 07/2016  . Hypothyroidism    Last TSH (04/2017) was normal, off levothyroxine for months  . Preterm labor     Past Surgical History:  Procedure Laterality Date  . CERVICAL CERCLAGE N/A 12/15/2017   Procedure: CERCLAGE CERVICAL;  Surgeon: Osborne Oman, MD;  Location: [facility];  Service: Gynecology;  Laterality: N/A;  . CERVICAL CERCLAGE N/A 01/22/2020   Procedure: CERCLAGE CERVICAL;  Surgeon: Woodroe Mode, MD;  Location: [facility];  Service: Gynecology;  Laterality: N/A;  . NO PAST SURGERIES      Family History  Problem Relation Age of Onset  . Diabetes Mother   . Hypertension Mother   . Heart disease Mother   . Hyperlipidemia Father   . COPD Father   . Mental illness Maternal Grandmother     Social  History   Tobacco Use  . Smoking status: Never Smoker  . Smokeless tobacco: Never Used  Vaping Use  . Vaping Use: Never used  Substance Use Topics  . Alcohol use: No  . Drug use: No    Allergies:  Allergies  Allergen Reactions  . Metronidazole Other (See Comments)    Dizziness, taste of iron in mouth, and darkened urine    No medications prior to admission.    Review of Systems  Constitutional: Negative for fever.  Gastrointestinal: Negative for abdominal pain.  Genitourinary: Positive for pelvic pain. Negative for dysuria, hematuria, vaginal bleeding and vaginal discharge.   Physical Exam   Blood pressure 109/69, pulse 90, resp. rate 16, weight 73.9 kg, last menstrual period 10/26/2019, SpO2 100 %, currently breastfeeding.  Physical Exam Vitals and nursing note reviewed. Exam conducted with a chaperone present.  Constitutional:      General: She is not in acute distress.    Appearance: Normal appearance.  HENT:     Head: Normocephalic and atraumatic.  Pulmonary:     Effort: Pulmonary effort is normal. No respiratory distress.  Abdominal:  Palpations: Abdomen is soft.     Tenderness: There is no abdominal tenderness.  Genitourinary:    Comments: External: no lesions or erythema Vagina: rugated, pink, moist, scant yellow discharge, cerclage seen at 10 o'clock Cervix closed/thick  Musculoskeletal:        General: Normal range of motion.     Cervical back: Normal range of motion.  Skin:    General: Skin is warm and dry.  Neurological:     General: No focal deficit present.     Mental Status: She is alert and oriented to person, place, and time.  Psychiatric:        Mood and Affect: Mood normal.        Behavior: Behavior normal.   EFM: 130 bpm, mod variability, + accels, no decels Toco: UI  Results for orders placed or performed during the hospital encounter of 06/03/20 (from the past 24 hour(s))  Urinalysis, Routine w reflex microscopic Urine, Clean  Catch     Status: None   Collection Time: 06/03/20  6:40 PM  Result Value Ref Range   Color, Urine YELLOW YELLOW   APPearance CLEAR CLEAR   Specific Gravity, Urine 1.013 1.005 - 1.030   pH 7.0 5.0 - 8.0   Glucose, UA NEGATIVE NEGATIVE mg/dL   Hgb urine dipstick NEGATIVE NEGATIVE   Bilirubin Urine NEGATIVE NEGATIVE   Ketones, ur NEGATIVE NEGATIVE mg/dL   Protein, ur NEGATIVE NEGATIVE mg/dL   Nitrite NEGATIVE NEGATIVE   Leukocytes,Ua NEGATIVE NEGATIVE  Wet prep, genital     Status: Abnormal   Collection Time: 06/03/20  8:02 PM  Result Value Ref Range   Yeast Wet Prep HPF POC NONE SEEN NONE SEEN   Trich, Wet Prep NONE SEEN NONE SEEN   Clue Cells Wet Prep HPF POC NONE SEEN NONE SEEN   WBC, Wet Prep HPF POC FEW (A) NONE SEEN   Sperm NONE SEEN    MAU Course  Procedures  MDM Labs ordered and reviewed. No signs of UTI or PTL. Cerclage intact with no obvious tension. Pt reassured. Will attempt to arrange transportation so pt can attend prenatal appts. Pt requests to restart Zoloft, will send refills and arrange f/u with Roselyn Reef at [facility]. Discussed comfort measures for pelvic pain in pregnancy and given Rx maternity belt. Will order anatomy US outpt since this also has not been completed. Stable for discharge home.   Assessment and Plan   1. [redacted] weeks gestation of pregnancy   2. NST (non-stress test) reactive   3. Pelvic pain affecting pregnancy in third trimester, antepartum   4. Limited prenatal care in third trimester   5. Cervical cerclage suture present in third trimester   6. Depression affecting pregnancy    Discharge home Follow up at [facility] to resume care- message sent Follow up with behavioral health at [facility]- referral placed PTL precautions Pelvic rest  Allergies as of 06/03/2020      Reactions   Metronidazole Other (See Comments)   Dizziness, taste of iron in mouth, and darkened urine      Medication List    STOP taking these medications   miconazole 2 % vaginal  cream Commonly known as: MONISTAT 7   oxyCODONE-acetaminophen 5-325 MG tablet Commonly known as: PERCOCET/ROXICET   tinidazole 500 MG tablet Commonly known as: TINDAMAX     TAKE these medications   Blood Pressure Kit Devi 1 Device by Does not apply route daily. ICD 10: Z34.00   [place] 1  Device by Does not apply route daily.   multivitamin-prenatal 27-0.8 MG Tabs tablet Take 1 tablet by mouth daily at 12 noon. What changed: when to take this   sertraline 50 MG tablet Commonly known as: Zoloft Take 1 tablet (50 mg total) by mouth daily.      Live interpreter present but pt speaks and confirms understanding of [place], [place] 06/03/2020, 9:24 PM

## 2020-06-03 NOTE — MAU Note (Signed)
Pt reports she has been having pain and pressure since 22 weeks. Pain and pressure is worse today feels like a ball is in her pelvis pain happens when she stand up. Denies any vaginal bleeding or discharge  Good fetal movement ment felt. Has not had prenatal care since June.

## 2020-06-04 ENCOUNTER — Telehealth: Payer: Self-pay | Admitting: *Deleted

## 2020-06-04 LAB — GC/CHLAMYDIA PROBE AMP (~~LOC~~) NOT AT ARMC
Chlamydia: NEGATIVE
Comment: NEGATIVE
Comment: NORMAL
Neisseria Gonorrhea: NEGATIVE

## 2020-06-04 NOTE — Telephone Encounter (Signed)
Called pt w/interpreter Eda Royal. I advised that we will be scheduling prenatal visit appointment in the office as well as transportation for the viist. We will also schedule [facility] appt which will be a video visit and will be completed via her MyChart account. Pt voiced understanding and confirmed that she can be notified of appts via [place].

## 2020-06-06 NOTE — BH Specialist Note (Signed)
Error

## 2020-06-13 ENCOUNTER — Telehealth: Payer: Self-pay | Admitting: Obstetrics & Gynecology

## 2020-06-13 ENCOUNTER — Other Ambulatory Visit: Payer: Self-pay

## 2020-06-13 ENCOUNTER — Encounter: Payer: Medicaid Other | Admitting: Obstetrics & Gynecology

## 2020-06-13 ENCOUNTER — Ambulatory Visit: Payer: Self-pay | Admitting: Clinical

## 2020-06-13 DIAGNOSIS — Z91199 Patient's noncompliance with other medical treatment and regimen due to unspecified reason: Secondary | ICD-10-CM

## 2020-06-13 DIAGNOSIS — Z5329 Procedure and treatment not carried out because of patient's decision for other reasons: Secondary | ICD-10-CM

## 2020-06-13 NOTE — Telephone Encounter (Signed)
Attempted to reach patient about her missed appointment. Used Administrator, sports to leave a voice mail message about her missed appointment, and calling us to reschedule.

## 2020-06-13 NOTE — BH Specialist Note (Signed)
Pt did not arrive to video visit. HIPAA compliant voicemail was left for pt to call back [place] at [phone] using interpreter [place] [phone]). MyChart message was sent.  KReva Bores

## 2020-06-17 ENCOUNTER — Encounter: Payer: Self-pay | Admitting: *Deleted

## 2020-06-30 ENCOUNTER — Other Ambulatory Visit: Payer: Self-pay

## 2020-06-30 ENCOUNTER — Ambulatory Visit (INDEPENDENT_AMBULATORY_CARE_PROVIDER_SITE_OTHER): Payer: Medicaid Other | Admitting: Obstetrics & Gynecology

## 2020-06-30 VITALS — BP 127/87 | HR 108 | Wt 160.7 lb

## 2020-06-30 DIAGNOSIS — O0990 Supervision of high risk pregnancy, unspecified, unspecified trimester: Secondary | ICD-10-CM

## 2020-06-30 DIAGNOSIS — O3432 Maternal care for cervical incompetence, second trimester: Secondary | ICD-10-CM

## 2020-06-30 DIAGNOSIS — O0933 Supervision of pregnancy with insufficient antenatal care, third trimester: Secondary | ICD-10-CM | POA: Insufficient documentation

## 2020-06-30 DIAGNOSIS — Z23 Encounter for immunization: Secondary | ICD-10-CM | POA: Diagnosis not present
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
## 2020-06-30 NOTE — Patient Instructions (Signed)
Cerclaje cervical Cervical Cerclage  Un cerclaje cervical es un procedimiento quirrgico que se realiza para corregir el cuello uterino cuando se abre y se hace ms delgado antes de que el embarazo llegue a trmino. Tambin se denomina insuficiencia cervical o cuello uterino incompetente. Esta afeccin puede hacer que el trabajo de parto comience antes de lo debido (de [place]). En este procedimiento, el mdico [place] puntos (suturas) para cerrar el cuello uterino durante el [place]. El mdico puede utilizar una ecografa para guiar el procedimiento y Runner, broadcasting/film/video al beb. En la ecografa, se usan ondas sonoras para tomar imgenes del cuello uterino y del tero. El mdico evaluar esas imgenes en un monitor del quirfano. Informe al mdico acerca de lo siguiente:  Todas las [employer], en especial, las alergias relacionadas con medicamentos recetados, puntos de sutura o anestsicos.  Todos los [employer] [place], incluidos vitaminas, hierbas, gotas oftlmicas, cremas y medicamentos de venta libre.  Sus antecedentes mdicos, entre ellos, partos anteriores con [place] de [place].  Cualquier problema previo que usted o algn miembro de su familia hayan tenido con los anestsicos.  Cualquier trastorno de la sangre que tenga.  Todas las cirugas a las que se someti, incluso si le han cosido el cuello uterino con puntos antes.  Cualquier afeccin que tenga o haya tenido. Cules son los riesgos? En general, se trata de un procedimiento seguro. Sin embargo, pueden ocurrir complicaciones, por ejemplo:  Infeccin, como infeccin del cuello uterino o de la bolsa de lquido que rodea al beb (saco amnitico).  Sangrado vaginal.  Reacciones alrgicas a los medicamentos.  Daos en estructuras u rganos cercanos, como lesiones en el cuello uterino o desgarro del saco amnitico.  Contracciones prematuras e incluso comenzar el [place] de parto y [place] parto antes de  [place].  Distocia cervical. Ocurre cuando el cuello uterino no puede abrirse normalmente durante el [place]. Qu ocurre antes del procedimiento? Mantenerse hidratada Siga las instrucciones del mdico acerca de mantenerse hidratada, las cuales pueden incluir lo siguiente:  [employer] horas antes del procedimiento, puede beber lquidos transparentes, como agua, jugos de fruta sin pulpa, caf negro y t solo.  Restricciones en las comidas y bebidas Siga las instrucciones del mdico respecto de las restricciones de comidas o bebidas, las cuales pueden incluir lo siguiente:  Ocho horas antes del procedimiento, deje de ingerir comidas o alimentos pesados, como carne, alimentos fritos o alimentos grasos.  Seis horas antes del procedimiento, deje de ingerir comidas o alimentos livianos, como tostadas o cereales.  Seis horas antes del procedimiento, deje de beber [place] o bebidas que [employer].  Dos horas antes del procedimiento, deje de beber lquidos transparentes. Medicamentos Consulte al mdico sobre:  Quarry manager o suspender los medicamentos que [place] habitualmente. Esto es muy importante si toma medicamentos para la diabetes o anticoagulantes.  Tomar medicamentos como aspirina e ibuprofeno. Estos medicamentos pueden tener un efecto anticoagulante en la [place]. No tome estos medicamentos a menos que el mdico se lo indique.  Usar medicamentos de venta libre, vitaminas, hierbas y suplementos. Seguridad en la ciruga Pregntele al mdico:  Cmo se Tax inspector de la Leisure centre manager.  Qu medidas se tomarn para evitar una infeccin. Estas medidas pueden incluir: ? Rasurar el vello del lugar de la ciruga. ? Lavar la piel con un jabn antisptico. ? Suministrar antibiticos. Instrucciones generales  No se coloque lociones, desodorantes ni perfumes.  Qutese los lentes de contacto y las joyas.  Pueden hacerle un examen o pruebas,  por ejemplo, anlisis de [place].  Haga  que alguien la lleve a su casa desde el hospital o la clnica.  Si se ir a su casa inmediatamente despus del procedimiento, pdale a alguien que se quede con usted durante 24horas. Qu ocurre durante el procedimiento?  Le colocarn una va intravenosa (IV) en una de las venas.  Pueden administrarle uno o ms de los siguientes medicamentos: ? Un medicamento para ayudar a relajarse (sedante). ? Un medicamento para adormecer la zona (anestesia local). ? Un medicamento que la har dormir (anestesia general). ? Un medicamento que se inyecta en la columna vertebral para adormecer la zona que est por debajo y apenas por encima del sitio de la inyeccin (anestesia raqudea).  Se introducir en la vagina un instrumento lubricado (espculo). El espculo se ensanchar para abrir las paredes de la vagina a fin de que el cirujano pueda ver el cuello uterino.  Sujetarn el cuello uterino y lo cerrarn con suturas de [place] apretada. Para ello, el cirujano coser una franja con puntos de hilo resistente alrededor del cuello uterino; a continuacin, se ajustar el hilo para mantener el cuello uterino cerrado. El procedimiento puede variar segn el mdico y el hospital. Sander Nephew ocurre despus del procedimiento?  Le controlarn la presin arterial, la frecuencia cardaca, la frecuencia respiratoria y Retail buyer de oxgeno en la sangre hasta que le den el alta del hospital o la clnica.  La controlarn para detectar si presenta contracciones prematuras.  Podra tener un pequeo sangrado y sentir leves clicos abdominales.  Quizs Al[employer]a Inc de compresin. Estas medias ayudan a Mining engineer formacin de cogulos de [place] y a reducir la hinchazn de las piernas.  Si le administraron un sedante durante el procedimiento, puede afectarla por varias horas. No conduzca ni opere maquinaria hasta que el mdico le indique que es seguro [place].  Podran indicarle que haga reposo en cama.  Podran aplicarle una  inyeccin de una hormona (progesterona) para evitar las Administrator, sports. Resumen  El cerclaje cervical es un procedimiento quirrgico en el que se usan puntos para coser y cerrar el cuello uterino durante el [place].  Antes del procedimiento, informe al [employer] que toma o los problemas mdicos o trastornos de la sangre que tenga.  Es un procedimiento seguro. Sin embargo, puede haber complicaciones, como una infeccin, hemorragia o parto prematuro.  Antes del procedimiento, siga todas las instrucciones acerca de lo que puede comer y beber. Planifique para que alguien la lleve a su casa desde el hospital o la clnica. Esta informacin no tiene Marine scientist el consejo del mdico. Asegrese de hacerle al mdico cualquier pregunta que tenga. Document Revised: 05/23/2019 Document Reviewed: 05/23/2019 Elsevier Patient Education  [place].

## 2020-07-01 LAB — CBC
Hematocrit: 31.9 % — ABNORMAL LOW (ref 34.0–46.6)
Hemoglobin: 9.9 g/dL — ABNORMAL LOW (ref 11.1–15.9)
MCH: 24 pg — ABNORMAL LOW (ref 26.6–33.0)
MCHC: 31 g/dL — ABNORMAL LOW (ref 31.5–35.7)
MCV: 77 fL — ABNORMAL LOW (ref 79–97)
Platelets: 248 10*3/uL (ref 150–450)
RBC: 4.12 x10E6/uL (ref 3.77–5.28)
RDW: 14.1 % (ref 11.7–15.4)
WBC: 10.8 10*3/uL (ref 3.4–10.8)

## 2020-07-01 LAB — RPR: RPR Ser Ql: NONREACTIVE

## 2020-07-01 LAB — HIV ANTIBODY (ROUTINE TESTING W REFLEX): HIV Screen 4th Generation wRfx: NONREACTIVE

## 2020-07-01 LAB — GLUCOSE TOLERANCE, 1 HOUR: Glucose, 1Hr PP: 124 mg/dL (ref 65–199)

## 2020-07-02 ENCOUNTER — Other Ambulatory Visit: Payer: Self-pay

## 2020-07-02 ENCOUNTER — Ambulatory Visit: Payer: Medicaid Other | Attending: Certified Nurse Midwife

## 2020-07-02 ENCOUNTER — Ambulatory Visit: Payer: Medicaid Other | Admitting: *Deleted

## 2020-07-02 ENCOUNTER — Encounter: Payer: Self-pay | Admitting: *Deleted

## 2020-07-02 DIAGNOSIS — Z3A35 35 weeks gestation of pregnancy: Secondary | ICD-10-CM | POA: Diagnosis not present

## 2020-07-02 DIAGNOSIS — F419 Anxiety disorder, unspecified: Secondary | ICD-10-CM

## 2020-07-02 DIAGNOSIS — O0933 Supervision of pregnancy with insufficient antenatal care, third trimester: Secondary | ICD-10-CM

## 2020-07-02 DIAGNOSIS — R45851 Suicidal ideations: Secondary | ICD-10-CM

## 2020-07-02 DIAGNOSIS — Z3A31 31 weeks gestation of pregnancy: Secondary | ICD-10-CM | POA: Insufficient documentation

## 2020-07-02 DIAGNOSIS — O0990 Supervision of high risk pregnancy, unspecified, unspecified trimester: Secondary | ICD-10-CM

## 2020-07-02 DIAGNOSIS — Z87898 Personal history of other specified conditions: Secondary | ICD-10-CM

## 2020-07-02 DIAGNOSIS — O09899 Supervision of other high risk pregnancies, unspecified trimester: Secondary | ICD-10-CM

## 2020-07-02 DIAGNOSIS — O3433 Maternal care for cervical incompetence, third trimester: Secondary | ICD-10-CM | POA: Insufficient documentation

## 2020-07-07 ENCOUNTER — Other Ambulatory Visit: Payer: Self-pay

## 2020-07-07 ENCOUNTER — Other Ambulatory Visit (HOSPITAL_COMMUNITY)
Admission: RE | Admit: 2020-07-07 | Discharge: 2020-07-07 | Disposition: A | Discharge status: Home / Self Care | Payer: Medicaid Other | Source: Ambulatory Visit | Attending: Advanced Practice Midwife | Admitting: Advanced Practice Midwife

## 2020-07-07 ENCOUNTER — Ambulatory Visit (INDEPENDENT_AMBULATORY_CARE_PROVIDER_SITE_OTHER): Payer: Medicaid Other | Admitting: Advanced Practice Midwife

## 2020-07-07 VITALS — BP 118/78 | HR 96 | Wt 164.4 lb

## 2020-07-07 DIAGNOSIS — O3433 Maternal care for cervical incompetence, third trimester: Secondary | ICD-10-CM

## 2020-07-07 DIAGNOSIS — Z3A36 36 weeks gestation of pregnancy: Secondary | ICD-10-CM | POA: Diagnosis not present

## 2020-07-07 DIAGNOSIS — O99013 Anemia complicating pregnancy, third trimester: Secondary | ICD-10-CM

## 2020-07-07 DIAGNOSIS — O0990 Supervision of high risk pregnancy, unspecified, unspecified trimester: Secondary | ICD-10-CM | POA: Diagnosis not present

## 2020-07-07 DIAGNOSIS — Z789 Other specified health status: Secondary | ICD-10-CM

## 2020-07-07 DIAGNOSIS — R102 Pelvic and perineal pain: Secondary | ICD-10-CM

## 2020-07-07 DIAGNOSIS — O26893 Other specified pregnancy related conditions, third trimester: Secondary | ICD-10-CM

## 2020-07-07 DIAGNOSIS — L299 Pruritus, unspecified: Secondary | ICD-10-CM

## 2020-07-07 MED ORDER — FERROUS SULFATE 325 (65 FE) MG PO TABS: 325.0000 mg | ORAL_TABLET | Freq: Two times a day (BID) | ORAL | 1 refills | Status: DC

## 2020-07-07 MED ORDER — CYCLOBENZAPRINE HCL 10 MG PO TABS: 10.0000 mg | ORAL_TABLET | Freq: Three times a day (TID) | ORAL | 2 refills | Status: DC | PRN
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
## 2020-07-07 NOTE — Patient Instructions (Signed)
Infeccin por estreptococo del grupo&nbsp;B durante el embarazo Group B Streptococcus Infection During Pregnancy El estreptococo del grupo B (EGB) es un tipo de bacteria que se encuentra a menudo en las personas sanas. Generalmente se encuentra en el recto, la vagina y los intestinos. En personas saludables y en mujeres no embarazadas, rara vez la bacteria provoca una enfermedad o complicaciones graves. Sin embargo, las mujeres [place] de EGB es positiva durante el embarazo pueden transmitirle la bacteria al beb en el parto. Esto puede provocar una infeccin grave en el beb despus del nacimiento. Las mujeres con EGB tambin pueden tener infecciones durante el Media planner o poco despus del [place]. Las infecciones incluyen infecciones de las vas urinarias (IU) o infecciones del tero. Los EGB tambin McDonald[employer] Corporation riesgo de complicaciones durante el [place], como parto o trabajo de parto prematuros, aborto espontneo o muerte fetal. Se recomienda que todas las embarazadas se hagan pruebas de rutina para determinar la presencia de EGB. Cules son las causas? Esta afeccin es causada por la bacteria denominada Streptococcus agalactiae. Qu incrementa el riesgo? Puede tener un mayor riesgo de contraer una infeccin por EGB durante el embarazo si ya le ocurri en un embarazo previo. Cules son los signos o sntomas? En la [employer], la infeccin por EGB no causa sntomas en las [place]. Si hay sntomas, estos pueden incluir:  Inicio del trabajo de parto antes de la semana 64 de gestacin.  Una infeccin urinaria (IU) o en la vejiga. Esto puede causar fiebre, miccin frecuente o dolor y ardor al Garment/textile technologist.  Fiebre durante el [place] de [place]. Tambin puede haber latido cardaco rpido en la madre o en el beb. Los sntomas poco frecuentes pero graves de una posible infeccin por EGB en las mujeres incluyen:  Infeccin en la sangre (septicemia). Esto puede provocar fiebre, escalofros o  confusin.  Infeccin pulmonar (neumona). Esto puede provocar fiebre, escalofros, tos, respiracin rpida, dolor torcico o dificultad para respirar.  Infeccin en los huesos, las articulaciones, la piel o los tejidos blandos. Cmo se diagnostica? Le pueden realizar exmenes de deteccin de EGB entre la semana 35 y la semana 68 de gestacin. Si tiene sntomas de trabajo de Environmental education officer, Educational psychologist los exmenes de deteccin antes. Esta afeccin se diagnostica a travs de los [place] de anlisis de laboratorio de:  Un hisopado del lquido de la vagina y del recto.  Truddie C[place] de [place]. Cmo se trata? Esta afeccin se trata con un antibitico. Le pueden administrar antibiticos:  Al comenzar el trabajo de parto o apenas se rompa la bolsa de aguas. El uso de los medicamentos continuar hasta despus del [place]. Si tiene un parto por cesrea no necesita antibiticos, salvo que se haya roto la bolsa de [place].  Para el beb, si necesita tratamiento. El mdico controlar al beb para decidir si necesita antibiticos a fin de prevenir una infeccin grave. Siga estas instrucciones en su casa:  Tome los medicamentos de venta libre y los recetados solamente como se lo haya indicado el mdico.  Tome su antibitico como se lo haya indicado el mdico. No deje de tomar el antibitico aunque comience a sentirse mejor.  Concurra a todas las visitas previas al parto (prenatales) y las visitas de control como se lo haya indicado el mdico. Esto es importante. Comunquese con un mdico si:  Siente dolor o ardor al Garment/textile technologist.  Tiene que orinar con ms frecuencia de lo habitual.  Tiene fiebre o escalofros.  Tiene una secrecin vaginal  con mal olor. Solicite ayuda de inmediato si:  Rompe la bolsa.  Comienza el trabajo de [place].  Siente un dolor intenso en el abdomen.  Tiene dificultad para respirar.  Siente dolor en el pecho. Estos sntomas pueden representar un problema grave que  constituye Engineer, maintenance (IT). No espere a ver si los sntomas desaparecen. Solicite atencin mdica de inmediato. Comunquese con el servicio de emergencias de su localidad (911 en los [place]). No conduzca por sus propios medios Principal Financial. Resumen  El EGB es un tipo de bacteria que se encuentra con frecuencia en personas sanas.  Durante el embarazo, la colonizacin con EGB puede causar complicaciones graves para usted o el beb.  El mdico la examinar entre la semana 9 y 61 de embarazo para Teacher, adult education si tiene Social worker de EGB.  Si esto sucede Solicitor, el mdico le recomendar antibiticos a travs de una va intravenosa durante el [place] de [place].  Despus del parto, se evaluar al beb para detectar complicaciones relacionadas con una posible infeccin por EGB, lo cual puede requerir antibiticos para prevenir una infeccin grave. Esta informacin no tiene Marine scientist el consejo del mdico. Asegrese de hacerle al mdico cualquier pregunta que tenga. Document Revised: 04/11/2019 Document Reviewed: 04/11/2019 Elsevier Patient Education  [place].

## 2020-07-07 NOTE — Addendum Note (Signed)
Addended by: Darlina Rumpf on: 07/07/2020 01:26 PM   Modules accepted: Orders

## 2020-07-08 LAB — GC/CHLAMYDIA PROBE AMP (~~LOC~~) NOT AT ARMC
Chlamydia: NEGATIVE
Comment: NEGATIVE
Comment: NORMAL
Neisseria Gonorrhea: NEGATIVE

## 2020-07-08 LAB — HEPATIC FUNCTION PANEL
ALT: 10 IU/L (ref 0–32)
AST: 19 IU/L (ref 0–40)
Albumin: 3.9 g/dL (ref 3.8–4.8)
Alkaline Phosphatase: 179 IU/L — ABNORMAL HIGH (ref 44–121)
Bilirubin Total: 0.3 mg/dL (ref 0.0–1.2)
Bilirubin, Direct: <0.1 mg/dL (ref 0.00–0.40)
Total Protein: 7.5 g/dL (ref 6.0–8.5)

## 2020-07-08 LAB — BILE ACIDS, TOTAL: Bile Acids Total: <1 umol/L (ref 0.0–10.0)

## 2020-07-11 LAB — CULTURE, BETA STREP (GROUP B ONLY): Strep Gp B Culture: NEGATIVE

## 2020-07-14 ENCOUNTER — Other Ambulatory Visit: Payer: Self-pay

## 2020-07-14 ENCOUNTER — Ambulatory Visit (INDEPENDENT_AMBULATORY_CARE_PROVIDER_SITE_OTHER): Payer: Medicaid Other | Admitting: Obstetrics and Gynecology

## 2020-07-14 DIAGNOSIS — Z789 Other specified health status: Secondary | ICD-10-CM

## 2020-07-14 DIAGNOSIS — Z3A37 37 weeks gestation of pregnancy: Secondary | ICD-10-CM

## 2020-07-14 DIAGNOSIS — R300 Dysuria: Secondary | ICD-10-CM

## 2020-07-14 DIAGNOSIS — O0990 Supervision of high risk pregnancy, unspecified, unspecified trimester: Secondary | ICD-10-CM

## 2020-07-14 LAB — POCT URINALYSIS DIP (DEVICE)
Bilirubin Urine: NEGATIVE
Glucose, UA: NEGATIVE mg/dL
Hgb urine dipstick: NEGATIVE
Ketones, ur: NEGATIVE mg/dL
Leukocytes,Ua: NEGATIVE
Nitrite: NEGATIVE
Protein, ur: NEGATIVE mg/dL
Specific Gravity, Urine: 1.025 (ref 1.005–1.030)
Urobilinogen, UA: 0.2 mg/dL (ref 0.0–1.0)
pH: 7 (ref 5.0–8.0)

## 2020-07-14 MED ORDER — DOCUSATE SODIUM 100 MG PO CAPS: 100.0000 mg | ORAL_CAPSULE | Freq: Two times a day (BID) | ORAL | 2 refills | Status: DC | PRN
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
## 2020-07-14 NOTE — Progress Notes (Signed)
Video Interpreter # [phone]

## 2020-07-15 LAB — URINALYSIS, ROUTINE W REFLEX MICROSCOPIC
Bilirubin, UA: NEGATIVE
Glucose, UA: NEGATIVE
Ketones, UA: NEGATIVE
Leukocytes,UA: NEGATIVE
Nitrite, UA: NEGATIVE
Protein,UA: NEGATIVE
RBC, UA: NEGATIVE
Specific Gravity, UA: 1.021 (ref 1.005–1.030)
Urobilinogen, Ur: 1 mg/dL (ref 0.2–1.0)
pH, UA: 6.5 (ref 5.0–7.5)

## 2020-07-16 ENCOUNTER — Encounter: Payer: Self-pay | Admitting: *Deleted

## 2020-07-20 ENCOUNTER — Inpatient Hospital Stay (HOSPITAL_COMMUNITY)
Admission: EM | Admit: 2020-07-20 | Discharge: 2020-07-21 | DRG: 807 | Disposition: A | Discharge status: Home / Self Care | Payer: Medicaid Other | Attending: Obstetrics and Gynecology | Admitting: Obstetrics and Gynecology

## 2020-07-20 ENCOUNTER — Encounter (HOSPITAL_COMMUNITY): Payer: Self-pay | Admitting: Obstetrics and Gynecology

## 2020-07-20 ENCOUNTER — Inpatient Hospital Stay (HOSPITAL_COMMUNITY): Payer: Medicaid Other | Admitting: Anesthesiology

## 2020-07-20 ENCOUNTER — Other Ambulatory Visit: Payer: Self-pay

## 2020-07-20 DIAGNOSIS — O26893 Other specified pregnancy related conditions, third trimester: Secondary | ICD-10-CM | POA: Diagnosis present

## 2020-07-20 DIAGNOSIS — R87610 Atypical squamous cells of undetermined significance on cytologic smear of cervix (ASC-US): Secondary | ICD-10-CM | POA: Diagnosis present

## 2020-07-20 DIAGNOSIS — R45851 Suicidal ideations: Secondary | ICD-10-CM

## 2020-07-20 DIAGNOSIS — O479 False labor, unspecified: Secondary | ICD-10-CM

## 2020-07-20 DIAGNOSIS — Z20822 Contact with and (suspected) exposure to covid-19: Secondary | ICD-10-CM | POA: Diagnosis present

## 2020-07-20 DIAGNOSIS — D509 Iron deficiency anemia, unspecified: Secondary | ICD-10-CM | POA: Diagnosis present

## 2020-07-20 DIAGNOSIS — O0990 Supervision of high risk pregnancy, unspecified, unspecified trimester: Secondary | ICD-10-CM

## 2020-07-20 DIAGNOSIS — O09899 Supervision of other high risk pregnancies, unspecified trimester: Secondary | ICD-10-CM

## 2020-07-20 DIAGNOSIS — E039 Hypothyroidism, unspecified: Secondary | ICD-10-CM | POA: Diagnosis present

## 2020-07-20 DIAGNOSIS — Z789 Other specified health status: Secondary | ICD-10-CM | POA: Diagnosis present

## 2020-07-20 DIAGNOSIS — Z3A38 38 weeks gestation of pregnancy: Secondary | ICD-10-CM

## 2020-07-20 DIAGNOSIS — O9902 Anemia complicating childbirth: Secondary | ICD-10-CM | POA: Diagnosis present

## 2020-07-20 DIAGNOSIS — O99344 Other mental disorders complicating childbirth: Secondary | ICD-10-CM | POA: Diagnosis present

## 2020-07-20 DIAGNOSIS — F32A Depression, unspecified: Secondary | ICD-10-CM | POA: Diagnosis present

## 2020-07-20 DIAGNOSIS — O9903 Anemia complicating the puerperium: Secondary | ICD-10-CM | POA: Diagnosis not present

## 2020-07-20 DIAGNOSIS — O99013 Anemia complicating pregnancy, third trimester: Secondary | ICD-10-CM

## 2020-07-20 DIAGNOSIS — F419 Anxiety disorder, unspecified: Secondary | ICD-10-CM | POA: Diagnosis present

## 2020-07-20 DIAGNOSIS — D649 Anemia, unspecified: Secondary | ICD-10-CM | POA: Diagnosis not present

## 2020-07-20 DIAGNOSIS — Z87898 Personal history of other specified conditions: Secondary | ICD-10-CM

## 2020-07-20 LAB — CBC
HCT: 35.2 % — ABNORMAL LOW (ref 36.0–46.0)
Hemoglobin: 10.5 g/dL — ABNORMAL LOW (ref 12.0–15.0)
MCH: 24.4 pg — ABNORMAL LOW (ref 26.0–34.0)
MCHC: 29.8 g/dL — ABNORMAL LOW (ref 30.0–36.0)
MCV: 81.9 fL (ref 80.0–100.0)
Platelets: 263 10*3/uL (ref 150–400)
RBC: 4.3 MIL/uL (ref 3.87–5.11)
RDW: 18.7 % — ABNORMAL HIGH (ref 11.5–15.5)
WBC: 9.3 10*3/uL (ref 4.0–10.5)
nRBC: 0 % (ref 0.0–0.2)

## 2020-07-20 LAB — RESP PANEL BY RT-PCR (FLU A&B, COVID) ARPGX2
Influenza A by PCR: NEGATIVE
Influenza B by PCR: NEGATIVE
SARS Coronavirus 2 by RT PCR: NEGATIVE

## 2020-07-20 LAB — RPR: RPR Ser Ql: NONREACTIVE

## 2020-07-20 MED ORDER — LIDOCAINE HCL (PF) 1 % IJ SOLN: 30.0000 mL | INTRAMUSCULAR | Status: DC | PRN

## 2020-07-20 MED ORDER — ONDANSETRON HCL 4 MG PO TABS: 4.0000 mg | ORAL_TABLET | ORAL | Status: DC | PRN

## 2020-07-20 MED ORDER — WITCH HAZEL-GLYCERIN EX PADS: 1.0000 "application " | MEDICATED_PAD | CUTANEOUS | Status: DC | PRN

## 2020-07-20 MED ORDER — LACTATED RINGERS IV BOLUS: 1000.0000 mL | Freq: Once | INTRAVENOUS | Status: DC

## 2020-07-20 MED ORDER — LIDOCAINE HCL (PF) 1 % IJ SOLN
INTRAMUSCULAR | Status: DC | PRN
  Administered 2020-07-20: 10 mL via EPIDURAL

## 2020-07-20 MED ORDER — PRENATAL MULTIVITAMIN CH
1.0000 | ORAL_TABLET | Freq: Every day | ORAL | Status: DC
  Administered 2020-07-20 – 2020-07-21 (×2): 1 via ORAL
  Filled 2020-07-20 (×2): qty 1

## 2020-07-20 MED ORDER — PHENYLEPHRINE 40 MCG/ML (10ML) SYRINGE FOR IV PUSH (FOR BLOOD PRESSURE SUPPORT): 80.0000 ug | PREFILLED_SYRINGE | INTRAVENOUS | Status: DC | PRN

## 2020-07-20 MED ORDER — SIMETHICONE 80 MG PO CHEW: 80.0000 mg | CHEWABLE_TABLET | ORAL | Status: DC | PRN

## 2020-07-20 MED ORDER — OXYTOCIN-SODIUM CHLORIDE 30-0.9 UT/500ML-% IV SOLN
2.5000 [IU]/h | INTRAVENOUS | Status: DC
  Administered 2020-07-20: 06:00:00 2.5 [IU]/h via INTRAVENOUS
  Filled 2020-07-20: qty 500

## 2020-07-20 MED ORDER — SODIUM CHLORIDE (PF) 0.9 % IJ SOLN
INTRAMUSCULAR | Status: DC | PRN
  Administered 2020-07-20: 12 mL/h via EPIDURAL

## 2020-07-20 MED ORDER — ACETAMINOPHEN 325 MG PO TABS: 650.0000 mg | ORAL_TABLET | ORAL | Status: DC | PRN

## 2020-07-20 MED ORDER — IBUPROFEN 600 MG PO TABS
600.0000 mg | ORAL_TABLET | Freq: Four times a day (QID) | ORAL | Status: DC
  Administered 2020-07-20 – 2020-07-21 (×5): 600 mg via ORAL
  Filled 2020-07-20 (×5): qty 1

## 2020-07-20 MED ORDER — SERTRALINE HCL 50 MG PO TABS
50.0000 mg | ORAL_TABLET | Freq: Every day | ORAL | Status: DC
  Administered 2020-07-20 – 2020-07-21 (×2): 50 mg via ORAL
  Filled 2020-07-20 (×3): qty 1

## 2020-07-20 MED ORDER — OXYTOCIN BOLUS FROM INFUSION
333.0000 mL | Freq: Once | INTRAVENOUS | Status: AC
  Administered 2020-07-20: 05:00:00 333 mL via INTRAVENOUS

## 2020-07-20 MED ORDER — EPHEDRINE 5 MG/ML INJ: 10.0000 mg | INTRAVENOUS | Status: DC | PRN

## 2020-07-20 MED ORDER — BENZOCAINE-MENTHOL 20-0.5 % EX AERO: 1.0000 "application " | INHALATION_SPRAY | CUTANEOUS | Status: DC | PRN

## 2020-07-20 MED ORDER — TRANEXAMIC ACID-NACL 1000-0.7 MG/100ML-% IV SOLN
INTRAVENOUS | Status: AC
  Administered 2020-07-20: 05:00:00 1000 mg
  Filled 2020-07-20: qty 100

## 2020-07-20 MED ORDER — MEASLES, MUMPS & RUBELLA VAC IJ SOLR: 0.5000 mL | Freq: Once | INTRAMUSCULAR | Status: DC

## 2020-07-20 MED ORDER — DIBUCAINE (PERIANAL) 1 % EX OINT: 1.0000 "application " | TOPICAL_OINTMENT | CUTANEOUS | Status: DC | PRN

## 2020-07-20 MED ORDER — TETANUS-DIPHTH-ACELL PERTUSSIS 5-2.5-18.5 LF-MCG/0.5 IM SUSY: 0.5000 mL | PREFILLED_SYRINGE | Freq: Once | INTRAMUSCULAR | Status: DC

## 2020-07-20 MED ORDER — SENNOSIDES-DOCUSATE SODIUM 8.6-50 MG PO TABS
2.0000 | ORAL_TABLET | ORAL | Status: DC
  Administered 2020-07-20 – 2020-07-21 (×2): 2 via ORAL
  Filled 2020-07-20 (×2): qty 2

## 2020-07-20 MED ORDER — MAGNESIUM HYDROXIDE 400 MG/5ML PO SUSP: 30.0000 mL | ORAL | Status: DC | PRN

## 2020-07-20 MED ORDER — DIPHENHYDRAMINE HCL 25 MG PO CAPS: 25.0000 mg | ORAL_CAPSULE | Freq: Four times a day (QID) | ORAL | Status: DC | PRN

## 2020-07-20 MED ORDER — LACTATED RINGERS IV SOLN: INTRAVENOUS | Status: DC

## 2020-07-20 MED ORDER — COCONUT OIL OIL: 1.0000 "application " | TOPICAL_OIL | Status: DC | PRN

## 2020-07-20 MED ORDER — DIPHENHYDRAMINE HCL 50 MG/ML IJ SOLN: 12.5000 mg | INTRAMUSCULAR | Status: DC | PRN

## 2020-07-20 MED ORDER — SOD CITRATE-CITRIC ACID 500-334 MG/5ML PO SOLN: 30.0000 mL | ORAL | Status: DC | PRN

## 2020-07-20 MED ORDER — LACTATED RINGERS IV SOLN
500.0000 mL | Freq: Once | INTRAVENOUS | Status: AC
  Administered 2020-07-20: 03:00:00 500 mL via INTRAVENOUS

## 2020-07-20 MED ORDER — ONDANSETRON HCL 4 MG/2ML IJ SOLN: 4.0000 mg | INTRAMUSCULAR | Status: DC | PRN

## 2020-07-20 MED ORDER — FENTANYL CITRATE (PF) 100 MCG/2ML IJ SOLN: 50.0000 ug | INTRAMUSCULAR | Status: DC | PRN

## 2020-07-20 MED ORDER — FERROUS SULFATE 325 (65 FE) MG PO TABS
325.0000 mg | ORAL_TABLET | ORAL | Status: DC
  Administered 2020-07-20: 11:00:00 325 mg via ORAL
  Filled 2020-07-20: qty 1

## 2020-07-20 MED ORDER — ONDANSETRON HCL 4 MG/2ML IJ SOLN: 4.0000 mg | Freq: Four times a day (QID) | INTRAMUSCULAR | Status: DC | PRN

## 2020-07-20 MED ORDER — FENTANYL-BUPIVACAINE-NACL 0.5-0.125-0.9 MG/250ML-% EP SOLN
EPIDURAL | Status: AC
  Filled 2020-07-20: qty 250

## 2020-07-20 MED ORDER — TRANEXAMIC ACID-NACL 1000-0.7 MG/100ML-% IV SOLN: 1000.0000 mg | INTRAVENOUS | Status: DC

## 2020-07-20 MED ORDER — FENTANYL-BUPIVACAINE-NACL 0.5-0.125-0.9 MG/250ML-% EP SOLN: 12.0000 mL/h | EPIDURAL | Status: DC | PRN

## 2020-07-20 MED ORDER — LACTATED RINGERS IV SOLN: 500.0000 mL | INTRAVENOUS | Status: DC | PRN
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
## 2020-07-20 NOTE — Anesthesia Preprocedure Evaluation (Signed)
Anesthesia Evaluation  Patient identified by MRN, date of birth, ID band Patient awake    Reviewed: Allergy & Precautions, NPO status , Patient's Chart, lab work & pertinent test results  Airway Mallampati: I       Dental no notable dental hx.    Pulmonary neg pulmonary ROS,    Pulmonary exam normal        Cardiovascular Exercise Tolerance: Good negative cardio ROS Normal cardiovascular exam     Neuro/Psych Anxiety Depression negative neurological ROS     GI/Hepatic negative GI ROS, Neg liver ROS,   Endo/Other  Hypothyroidism   Renal/GU negative Renal ROS     Musculoskeletal negative musculoskeletal ROS (+)   Abdominal Normal abdominal exam  (+) + obese,   Peds  Hematology  (+) Blood dyscrasia, anemia , Plt 230k    Anesthesia Other Findings   Reproductive/Obstetrics (+) Pregnancy Short cervix                              Anesthesia Physical  Anesthesia Plan  ASA: II  Anesthesia Plan: Epidural   Post-op Pain Management:    Induction:   PONV Risk Score and Plan:   Airway Management Planned:   Additional Equipment:   Intra-op Plan:   Post-operative Plan:   Informed Consent: I have reviewed the patients History and Physical, chart, labs and discussed the procedure including the risks, benefits and alternatives for the proposed anesthesia with the patient or authorized representative who has indicated his/her understanding and acceptance.       Plan Discussed with:   Anesthesia Plan Comments:         Anesthesia Quick Evaluation
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
## 2020-07-20 NOTE — ED Triage Notes (Addendum)
Patient reports sudden onset abdominal contractions at 1 am this morning , [redacted] weeks pregnant , G5P1 , no rupture of membranes /denies vaginal bleeding . Rapid response RN and OB MD at bedside - transported patient to delivery room .
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
## 2020-07-20 NOTE — Anesthesia Postprocedure Evaluation (Signed)
Anesthesia Post Note  Patient: Andrea Barry  Procedure(s) Performed: AN AD [place]     Patient location during evaluation: Mother Baby Anesthesia Type: Epidural Level of consciousness: awake Pain management: satisfactory to patient Vital Signs Assessment: post-procedure vital signs reviewed and stable Respiratory status: spontaneous breathing Cardiovascular status: stable Anesthetic complications: no   No complications documented.  Last Vitals:  Vitals:   07/20/20 0835 07/20/20 1113  BP: 109/64 113/78  Pulse: 74 69  Resp: 17 18  Temp: 36.8 C 36.5 C  SpO2:      Last Pain:  Vitals:   07/20/20 1114  TempSrc:   PainSc: 4    Pain Goal:                   [employer]

## 2020-07-20 NOTE — Anesthesia Procedure Notes (Signed)
Epidural Patient location during procedure: OB Start time: 07/20/2020 4:13 AM End time: 07/20/2020 4:16 AM  Staffing Anesthesiologist: Lyn Hollingshead, MD Performed: anesthesiologist   Preanesthetic Checklist Completed: patient identified, IV checked, site marked, risks and benefits discussed, surgical consent, monitors and equipment checked, pre-op evaluation and timeout performed  Epidural Patient position: sitting Prep: DuraPrep and site prepped and draped Patient monitoring: continuous pulse ox and blood pressure Approach: midline Location: L3-L4 Injection technique: LOR air  Needle:  Needle type: Tuohy  Needle gauge: 17 G Needle length: 9 cm and 9 Needle insertion depth: 4 cm Catheter type: closed end flexible Catheter size: 19 Gauge Catheter at skin depth: 9 cm Test dose: negative and Other  Assessment Events: blood not aspirated, injection not painful, no injection resistance, no paresthesia and negative IV test  Additional Notes Reason for block:procedure for pain

## 2020-07-21 ENCOUNTER — Encounter: Payer: Medicaid Other | Admitting: Obstetrics and Gynecology

## 2020-07-21 DIAGNOSIS — O9903 Anemia complicating the puerperium: Secondary | ICD-10-CM

## 2020-07-21 DIAGNOSIS — D649 Anemia, unspecified: Secondary | ICD-10-CM

## 2020-07-21 MED ORDER — COVID-19 MRNA VACC (MODERNA) 100 MCG/0.5ML IM SUSP
0.5000 mL | Freq: Once | INTRAMUSCULAR | Status: AC
  Administered 2020-07-21: 16:00:00 0.5 mL via INTRAMUSCULAR
  Filled 2020-07-21 (×2): qty 0.5

## 2020-07-21 MED ORDER — COVID-19 AD26 VACCINE(JANSSEN) 0.5 ML IM SUSP
0.5000 mL | Freq: Once | INTRAMUSCULAR | Status: DC
  Filled 2020-07-21: qty 0.5

## 2020-07-21 MED ORDER — IBUPROFEN 600 MG PO TABS: 600.0000 mg | ORAL_TABLET | Freq: Four times a day (QID) | ORAL | 0 refills | Status: DC | PRN

## 2020-07-21 NOTE — Discharge Instructions (Signed)
Parto vaginal, cuidados de puerperio Postpartum Care After Vaginal Delivery Lea esta informacin sobre cmo cuidarse desde el momento en que nazca su beb y Waylan Boga 6 a 12 semanas despus del parto (perodo del posparto). El mdico tambin podr darle instrucciones ms especficas. Comunquese con su mdico si tiene problemas o preguntas. Siga estas indicaciones en su casa: Hemorragia vaginal  Es normal tener un poco de hemorragia vaginal (loquios) despus del parto. Use un apsito sanitario para el sangrado vaginal y secrecin. ? Durante la primera semana despus del parto, la cantidad y el aspecto de los loquios a menudo es similar a las del perodo menstrual. ? Durante las siguientes semanas disminuir gradualmente hasta convertirse en una secrecin seca amarronada o [place]. ? En la [employer], los loquios se detienen [employer] 4 a 6semanas despus del [place]. Los sangrados vaginales pueden variar de mujer a Art therapist.  Cambie los apsitos sanitarios con frecuencia. Observe si hay cambios en el flujo, como: ? Un aumento repentino en el volumen. ? Cambio en el color. ? Cogulos sanguneos grandes.  Si expulsa un cogulo de sangre por la vagina, gurdelo y llame al mdico para informrselo. No deseche los cogulos de sangre por el inodoro antes de hablar con su mdico.  No use tampones ni se haga duchas vaginales hasta que el mdico la autorice.  Si no est amamantando, volver a tener su perodo entre 6 y 34 semanas despus del parto. Si solamente alimenta al beb con Steva Colder materna (lactancia materna exclusiva), podra no volver a tener su perodo hasta que deje de [place]. Cuidados perineales  Mantenga la zona entre la vagina y el ano (perineo) limpia y [place], [place] se lo haya indicado el mdico. Utilice apsitos o aerosoles analgsicos y Proofreader, como se lo hayan indicado.  Si le hicieron un corte en el perineo (episiotoma) o tuvo un desgarro en la vagina, controle la  zona para detectar signos de infeccin hasta que sane. Est atenta a los siguientes signos: ? Aumento del enrojecimiento, la hinchazn o Conservation officer, historic buildings. ? Presenta lquido o sangre que supura del corte o Tree surgeon. ? Calor. ? Pus o mal olor.  Es posible que le den una botella rociadora para que use en lugar de limpiarse el rea con papel higinico despus de usar el bao. Cuando comience a Scientist, research (medical), podr usar la botella rociadora antes de secarse. Asegrese de secarse suavemente.  Para aliviar el dolor causado por una episiotoma, un desgarro en la vagina o venas hinchadas en el ano (hemorroides), trate de tomar un bao de asiento tibio 2 o 3 veces por da. Un bao de asiento es un bao de agua tibia que se toma mientras se est sentado. El agua solo debe Systems analyst las caderas y cubrir las nalgas. [place] despus del parto, las mamas pueden sentirse pesadas, llenas e incmodas (congestin [place]). Tambin puede escaparse leche de sus senos. El mdico puede sugerirle mtodos para Gaffer. La congestin mamaria debera desaparecer al cabo de [employer].  Si est amamantando: ? Use un sostn que sujete y ajuste bien sus pechos. ? Mantenga los pezones secos y limpios. Aplquese cremas y ungentos, como se lo haya indicado el mdico. ? Es posible que deba usar discos de algodn en el sostn para Tax adviser la [place] que se filtre de sus senos. ? Puede tener contracciones uterinas cada vez que amamante durante varias semanas despus del [place]. Las contracciones uterinas ayudan al tero a  regresar a su tamao habitual. ? Si tiene algn problema con la lactancia materna, colabore con el mdico o un Lobbyist.  Si no est amamantando: ? Evite tocarse [employer]. Al hacerlo, podran producir ms leche. ? Use un sostn que le proporcione el ajuste correcto y compresas fras para reducir la hinchazn. ? No extraiga (saque) [employer]. Esto har que  produzca ms [employer]. Intimidad y sexualidad  Pregntele al mdico cundo puede retomar la actividad sexual. Esto puede depender de lo siguiente: ? Su riesgo de sufrir infecciones. ? La rapidez con la que est sanando. ? Su comodidad y deseo de retomar la actividad sexual.  Despus del parto, puede quedar embarazada incluso si no ha tenido todava su perodo. Si lo desea, hable con el mdico acerca de los mtodos de control de la natalidad (mtodos anticonceptivos). Medicamentos  [employer] de venta libre y los recetados solamente como se lo haya indicado el mdico.  Si le recetaron un antibitico, tmelo como se lo haya indicado el mdico. No deje de tomar el antibitico aunque comience a sentirse mejor. Actividad  Retome sus actividades normales de a poco como se lo haya indicado el mdico. Pregntele al mdico qu actividades son seguras para usted.  Descanse todo lo que pueda. Trate de descansar o tomar una siesta mientras el beb duerme. Comida y bebida   Beba suficiente lquido como para Theatre manager la orina de color amarillo plido.  Coma alimentos ricos en [employer]. Estos pueden ayudarla a prevenir o Cytogeneticist. Los alimentos ricos en fibras incluyen, entre otros: ? Panes y cereales integrales. ? Arroz integral. ? Optometrist. ? Lambert Mody y verduras frescas.  No intente perder de peso rpidamente reduciendo el consumo de caloras.  Tome sus vitaminas prenatales hasta la visita de seguimiento de posparto o hasta que su mdico le indique que puede dejar de tomarlas. Estilo de vida  No consuma ningn producto que contenga nicotina o tabaco, como cigarrillos y Psychologist, sport and exercise. Si necesita ayuda para dejar de fumar, consulte al mdico.  No beba alcohol, especialmente si est amamantando. Instrucciones generales  Concurra a todas las visitas de seguimiento para usted y el beb, como se lo haya indicado el mdico. La mayora de las mujeres  visita al mdico para un seguimiento de posparto dentro de las primeras 3 a 6 semanas despus del parto. Comunquese con un mdico si:  Se siente incapaz de controlar los cambios que implica tener un hijo y esos sentimientos no desaparecen.  Siente tristeza o preocupacin de forma [place] mamas se ponen rojas, le duelen o se endurecen.  Tiene fiebre.  Tiene dificultad para retener la [place] o para impedir que la orina se escape.  Tiene poco inters o falta de inters en actividades que solan gustarle.  No ha amamantado nada y no ha tenido un perodo menstrual durante 12 semanas despus del [place].  Dej de amamantar al beb y no ha tenido su perodo menstrual durante 12 semanas despus de dejar de Economist.  Tiene preguntas sobre su cuidado y el del beb.  Elimina un cogulo de sangre grande por la vagina. Solicite ayuda de inmediato si:  Electronics engineer.  Tiene dificultad para respirar.  Tiene un dolor repentino e intenso en la pierna.  Tiene dolor intenso o clicos en el la parte inferior del abdomen.  Tiene una hemorragia tan intensa de la vagina que empapa ms de un apsito en [employer]. El sangrado  no debe ser ms abundante que el perodo ms intenso que haya tenido.  Dolor de cabeza intenso.  Se desmaya.  Tiene visin borrosa o Geologist, engineering.  Tiene secrecin vaginal con mal olor.  Tiene pensamientos acerca de lastimarse a usted misma o a su beb. Si alguna vez siente que puede lastimarse a usted misma o a Producer, television/film/video, o tiene pensamientos de poner fin a su vida, busque ayuda de inmediato. Puede dirigirse al departamento de emergencias ms cercano o llamar a:  El servicio de emergencias de su localidad (911 en [place]  Una lnea de asistencia al suicida y Freight forwarder en crisis, como la [employer] de Prevencin del Suicidio (National Suicide Prevention Lifeline), al [phone]. Est disponible las 24 horas del da. Resumen  El  perodo de tiempo justo despus el parto y Waylan Boga 55 a 12 semanas despus del parto se denomina perodo posparto.  Retome sus actividades normales de a Theme park manager se lo haya indicado el mdico.  Concurra a todas las visitas de seguimiento para usted y Photographer beb, como se lo haya indicado el mdico. Esta informacin no tiene Marine scientist el consejo del mdico. Asegrese de hacerle al mdico cualquier pregunta que tenga. Document Revised: 11/05/2017 Document Reviewed: 07/17/2017 Elsevier Patient Education  [place].
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
## 2020-07-29 ENCOUNTER — Other Ambulatory Visit: Payer: Self-pay | Admitting: Advanced Practice Midwife

## 2020-08-01 ENCOUNTER — Inpatient Hospital Stay (HOSPITAL_COMMUNITY): Admission: AD | Admit: 2020-08-01 | Payer: Medicaid Other | Source: Home / Self Care

## 2020-08-06 NOTE — BH Specialist Note (Signed)
Integrated Behavioral Health via Telemedicine Visit  08/06/2020 Nylan Nakatani [place] [phone]  Pt did not arrive to video visit and did not answer the phone ; Unable to leave voicemail, per [employer] [phone] as mailbox is not set up; left MyChart message for patient.    Valetta Close Lailie Smead, LCSW

## 2020-08-13 ENCOUNTER — Telehealth: Payer: Self-pay | Admitting: *Deleted

## 2020-08-13 ENCOUNTER — Ambulatory Visit: Payer: Medicaid Other | Admitting: Clinical

## 2020-08-13 DIAGNOSIS — Z91199 Patient's noncompliance with other medical treatment and regimen due to unspecified reason: Secondary | ICD-10-CM

## 2020-08-13 DIAGNOSIS — Z5329 Procedure and treatment not carried out because of patient's decision for other reasons: Secondary | ICD-10-CM

## 2020-08-13 NOTE — Telephone Encounter (Signed)
Attempt to follow up with pt, through [address] [place], [place] [address]; Left HIPPA-compliant message to call back Asher Muir from Center for [employer] at [facility] at  [phone] [facility] office).
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
## 2020-08-21 ENCOUNTER — Ambulatory Visit: Payer: Medicaid Other | Admitting: Obstetrics and Gynecology

## 2020-09-02 ENCOUNTER — Ambulatory Visit: Payer: Medicaid Other | Admitting: Family Medicine

## 2020-09-03 ENCOUNTER — Ambulatory Visit: Payer: Medicaid Other | Admitting: Obstetrics and Gynecology

## 2020-09-04 ENCOUNTER — Ambulatory Visit (INDEPENDENT_AMBULATORY_CARE_PROVIDER_SITE_OTHER): Payer: Medicaid Other | Admitting: Obstetrics and Gynecology

## 2020-09-04 ENCOUNTER — Other Ambulatory Visit (HOSPITAL_COMMUNITY)
Admission: RE | Admit: 2020-09-04 | Discharge: 2020-09-04 | Disposition: A | Discharge status: Home / Self Care | Payer: Medicaid Other | Source: Ambulatory Visit | Attending: Obstetrics and Gynecology | Admitting: Obstetrics and Gynecology

## 2020-09-04 ENCOUNTER — Other Ambulatory Visit: Payer: Self-pay

## 2020-09-04 DIAGNOSIS — F32A Depression, unspecified: Secondary | ICD-10-CM

## 2020-09-04 DIAGNOSIS — R87610 Atypical squamous cells of undetermined significance on cytologic smear of cervix (ASC-US): Secondary | ICD-10-CM

## 2020-09-04 DIAGNOSIS — Z3202 Encounter for pregnancy test, result negative: Secondary | ICD-10-CM

## 2020-09-04 DIAGNOSIS — Z8759 Personal history of other complications of pregnancy, childbirth and the puerperium: Secondary | ICD-10-CM

## 2020-09-04 DIAGNOSIS — Z1331 Encounter for screening for depression: Secondary | ICD-10-CM | POA: Diagnosis not present

## 2020-09-04 DIAGNOSIS — O99345 Other mental disorders complicating the puerperium: Secondary | ICD-10-CM

## 2020-09-04 DIAGNOSIS — N870 Mild cervical dysplasia: Secondary | ICD-10-CM | POA: Diagnosis not present

## 2020-09-04 LAB — POCT URINALYSIS DIP (DEVICE)
Bilirubin Urine: NEGATIVE
Glucose, UA: NEGATIVE mg/dL
Hgb urine dipstick: NEGATIVE
Ketones, ur: NEGATIVE mg/dL
Leukocytes,Ua: NEGATIVE
Nitrite: NEGATIVE
Protein, ur: NEGATIVE mg/dL
Specific Gravity, Urine: 1.025 (ref 1.005–1.030)
Urobilinogen, UA: 0.2 mg/dL (ref 0.0–1.0)
pH: 7.5 (ref 5.0–8.0)

## 2020-09-04 LAB — POCT PREGNANCY, URINE: Preg Test, Ur: NEGATIVE

## 2020-09-04 MED ORDER — SERTRALINE HCL 100 MG PO TABS: 50.0000 mg | ORAL_TABLET | Freq: Every day | ORAL | 6 refills | Status: DC

## 2020-09-04 NOTE — Patient Instructions (Addendum)
[email or web address]">  Colposcopa Colposcopy  La colposcopa es un procedimiento para examinar la parte inferior del tero (cuello uterino), la vagina, la zona que rodea la abertura vaginal (vulva) para identificar anormalidades o signos de enfermedad. Este procedimiento se realiza con un instrumento que hace que los objetos aparezcan ms grandes y Retail buyer (colposcopio). Durante el procedimiento, el mdico puede extraer una muestra de tejido para examinarla luego con un microscopio (biopsia). Se puede realizar una biopsia si se encuentran clulas inusuales durante la colposcopa. Pueden hacerle una colposcopa si tiene lo siguiente:  Un frotis de [place]apan[place]ola[place], tambin llamado prueba de Papan[place]olaou, con resultado anormal. Esta prueba de deteccin se utiliza para detectar signos de cncer o infeccin de vagina, cuello uterino y tero.  Francee Gentile de VPH (virus del papiloma humano) con resultado positivo de un tipo de VPH que la pone en alto riesgo de Animal nutritionist.  Ciertas afecciones o sntomas, por ejemplo: ? [place] llaga o una lesin en el cuello uterino. ? Verrugas genitales en la vulva, la vagina o el cuello uterino. ? Dolor al Merrill Lynch. ? Sangrado vaginal, especialmente despus de Clinical biochemist.  Un crecimiento en el cuello uterino (plipo cervical) que debe ser extirpado. Informe a su mdico acerca de lo siguiente:  Cualquier alergia que tenga, incluidas las alergias a medicamentos, al ltex o al yodo.  Todos los UAL Corporation [place], incluidos vitaminas, hierbas, gotas oftlmicas, cremas y medicamentos de venta libre.  Cualquier problema previo que usted o algn miembro de su familia hayan tenido con los anestsicos.  Cualquier trastorno de la sangre que tenga.  Cirugas a las que se haya sometido.  Cualquier afeccin mdica que tenga, como enfermedad plvica inflamatoria (EPI) o trastorno  endometrial.  El patrn de sus ciclos menstruales y el mtodo de control de la natalidad (anticonceptivo) que [place].  Sus antecedentes mdicos, incluidos antecedentes de sufrir desmayos con frecuencia o de tratamiento del cuello uterino.  Si est embarazada o podra estarlo. Cules son los riesgos? En general, se trata de un procedimiento seguro. Sin embargo, pueden ocurrir complicaciones, por ejemplo:  Infeccin. Los sntomas de infeccin pueden incluir fiebre, secrecin vaginal con mal olor o dolor plvico.  Reacciones alrgicas a los medicamentos.  Daos a las estructuras o los rganos cercanos.  Desmayos. Esto es poco frecuente. Qu ocurre antes del procedimiento? Restricciones en las comidas y bebidas  Siga las instrucciones del mdico respecto de las restricciones en las comidas o las bebidas.  Probablemente deba seguir una dieta normal el da del procedimiento y no omitir Architect. Estudios  Pueden hacerle un examen o anlisis. Se har una prueba de Eastman Kodak del procedimiento.  Pueden extraerle Truddie Coco de [place] o pedirle [place] de [place]. Instrucciones generales  Consulte al mdico sobre: ? Quarry manager o suspender los medicamentos que [place] habitualmente. Esto es muy importante si toma medicamentos para la diabetes o anticoagulantes. ? Tomar medicamentos como aspirina e ibuprofeno. Estos medicamentos pueden tener un efecto anticoagulante en la [place]. No tome estos medicamentos a menos que el mdico se lo indique. Es probable que Investment banker, operational diga que evite tomar aspirina o medicamentos que contienen aspirina durante 7das antes del procedimiento. ? Usar medicamentos de venta libre, vitaminas, hierbas y suplementos.  Infrmele al mdico si tiene el perodo menstrual ahora o lo tendr en el momento del procedimiento. La colposcopa generalmente no se realiza durante el perodo menstrual.  Si [place] anticonceptivos, contine usndolos antes del  procedimiento.  Durante 24horas antes del  procedimiento: ? No se haga duchas vaginales ni use tampones. ? No se aplique medicamentos, cremas o supositorios en la vagina. ? No tenga relaciones sexuales.  Pregntele al mdico qu medidas se tomarn para prevenir una infeccin. Qu ocurre durante el procedimiento?  Estar acostada boca arriba, con los pies en los soportes (estribos).  Se entibiar una herramienta llamada espculo y se le pondr aceite o gel (la Architect). Luego, se le introducir el espculo en la vagina. Este se usar para separar las paredes de la vagina de modo que el mdico pueda ver el cuello uterino y el interior de la vagina.  Se utilizar un hisopo para Glass blower/designer una pequea cantidad de un lquido (solucin) en las zonas que se examinarn. Esta solucin facilita la observacin de clulas anormales. Puede sentir un leve ardor en este momento.  Se usar el colposcopio para observar el cuello del tero con una luz blanca brillante. El colposcopio se sostendr cerca de la vulva y har que la vulva, la vagina y el cuello uterino aparezcan ms grandes para que puedan verse mejor.  Si se necesita una biopsia: ? Pueden administrarle un medicamento para adormecer la zona (anestesia local). ? Se emplearn herramientas quirrgicas para retirar mucosidad y clulas a travs de la vagina. ? Puede sentir un dolor leve cuando le extraen la [place] de tejido. ? Puede haber hemorragia. Se puede usar una solucin para Psychologist, educational. ? Si se necesita obtener una biopsia del interior del cuello uterino, se puede hacer otro procedimiento, llamado curetaje endocervical (CEC). Durante este procedimiento, se usar una cureta para raspar clulas del cuello uterino o de la parte superior del cuello uterino (endocrvix).  Se registrar cualquier anomala que se detecte. El procedimiento puede variar segn el mdico y el hospital. Sander Nephew ocurre despus del procedimiento?  Estar  recostada y har reposo durante unos minutos. Tal vez le ofrezcan jugo o galletas.  Le controlarn la presin arterial, la frecuencia cardaca, la frecuencia respiratoria y Retail buyer de oxgeno en la sangre hasta que le den el alta del hospital o la clnica.  Puede tener calambres en el abdomen. Esto debe desaparecer despus de algunos minutos.  Es su responsabilidad retirar Gap Inc del procedimiento. Pregntele a su mdico, o a un miembro del personal del departamento donde se realice el procedimiento, cundo estarn Praxair. Resumen  La colposcopa es un procedimiento para examinar la parte inferior del tero (cuello uterino), la vagina, la zona que rodea la abertura vaginal (vulva) para identificar anormalidades o signos de enfermedad.  Se puede realizar una biopsia como parte del procedimiento.  Despus del procedimiento, permanecer recostada y descansar durante unos minutos.  Puede tener calambres en el abdomen. Esto debe desaparecer despus de algunos minutos. Esta informacin no tiene Marine scientist el consejo del mdico. Asegrese de hacerle al mdico cualquier pregunta que tenga. Document Revised: 09/03/2019 Document Reviewed: 09/03/2019 Elsevier Patient Education  2021 [place]. [email or web address]">  Colposcopa, cuidados posteriores Colposcopy, Care After Target Corporation brinda informacin sobre cmo cuidarse despus del procedimiento. Su mdico tambin podr darle instrucciones ms especficas. Comunquese con el mdico si tiene problemas o preguntas. Qu puedo esperar despus del procedimiento? Si le realizaron una colposcopa sin biopsia, puede esperar sentirse bien inmediatamente despus del procedimiento. Sin embargo, es posible que tenga algo de [place] de sangre durante [place]. Puede retomar sus actividades habituales. Si se le realiz una colposcopa con biopsia, despus del procedimiento es frecuente que  presente lo siguiente:  Sensibilidad y  dolor leve. [place].  Desvanecimiento.  Sangrado leve de la vagina o secrecin de color oscuro y Therapist, art. [place]. La secrecin puede deberse a un lquido (solucin) que se [place] durante el procedimiento. Durante este tiempo deber usar un apsito sanitario.  Manchas de Medtronic al menos 48horas despus del procedimiento. Siga estas instrucciones en su casa: Medicamentos  Use los medicamentos de venta libre y los recetados solamente como se lo haya indicado el mdico.  Hable con el mdico acerca de qu tipo de analgsico de venta libre y recetado puede volver a Radio producer. Es especialmente importante que hable con el mdico si toma anticoagulantes. Actividad  Limite la actividad fsica Agricultural consultant despus del procedimiento como se lo haya indicado el mdico.  Evite usar productos de ducha vaginal, usar tampones o Office manager sexuales durante al menos 3 das despus del procedimiento o durante el tiempo que le hayan indicado.  Retome sus actividades normales segn lo indicado el mdico. Pregntele al mdico qu actividades son seguras para usted. Instrucciones generales  Beber suficiente lquido como para mantener la orina de color amarillo plido.  Pregunte al mdico si puede tomar baos de inmersin, nadar o usar el jacuzzi. Puede ducharse.  Si [place] un mtodo anticonceptivo (anticoncepcin), contine utilizndolo.  Concurra a todas las visitas de seguimiento como se lo haya indicado el mdico. Esto es importante.   Comunquese con un mdico si:  Tiene una erupcin cutnea. Solicite ayuda de inmediato si:  Sangra mucho por la vagina o le salen cogulos de [place]. Esto incluye usar ms de un apsito sanitario cada hora durante 2 horas seguidas.  Tiene fiebre o escalofros.  Tiene secrecin vaginal que es anormal, tiene color amarillo o tiene mal olor. Puede ser un signo de  infeccin.  Tiene dolor intenso o clicos en la parte baja del abdomen que no se van con medicamentos.  Se desmaya. Resumen  Si se le realiz una colposcopa sin biopsia, puede esperar sentirse bien de inmediato, pero es posible que presente manchas de sangre por algunos das. Puede retomar sus actividades habituales.  Si se le realiz una colposcopa con biopsia, es comn tener un dolor leve durante algunos das y [place] de sangre durante 48 horas despus del procedimiento.  Evite usar productos de ducha vaginal, usar tampones y Office manager sexuales durante al menos 3 das despus del procedimiento o durante el tiempo que le haya indicado el mdico.  Busque ayuda de inmediato si tiene sangrado profuso, dolor intenso o signos de infeccin. Esta informacin no tiene Marine scientist el consejo del mdico. Asegrese de hacerle al mdico cualquier pregunta que tenga. Document Revised: 09/03/2019 Document Reviewed: 09/03/2019 Elsevier Patient Education  2021 [place] de trompas por laparoscopia Laparoscopic Tubal Ligation La ligadura de trompas por laparoscopia es un procedimiento para cerrar las trompas de [place]. Esto se hace para evitar un embarazo. Cuando las trompas de The Northwestern Mutual se Medical illustrator, los vulos que Honeywell ovarios no pueden ingresar al tero, y los espermatozoides no Printmaker a los vulos liberados. No debe realizarse este procedimiento si quiere quedar Sales promotion account executive o si no est segura de si desea tener ms hijos. Informe al mdico acerca de lo siguiente:  Cualquier alergia que tenga.  Todos los UAL Corporation [place], incluidos vitaminas, hierbas, gotas oftlmicas, cremas y medicamentos de venta libre.  Problemas previos que usted o algn miembro de su familia hayan tenido con los anestsicos.  Cualquier  trastorno de [employer]McMoRan [employer]pper [employer].  Cirugas a las que se haya sometido.  Cualquier afeccin mdica que tenga.  Si est embarazada o  podra estarlo.  Cualquier embarazo anterior. Cules son los riesgos? En general, se trata de un procedimiento seguro. Sin embargo, pueden ocurrir complicaciones, por ejemplo:  Infeccin.  Sangrado.  Lesin en otros rganos del abdomen.  Efectos secundarios de los TEPPCO Partners.  Arther Abbott del procedimiento. Este procedimiento puede aumentar su riesgo de Media planner ectpico. Se trata de un embarazo en el que el vulo fertilizado se fija en la parte de afuera del tero. Qu ocurre antes del procedimiento? Mantenerse hidratada Siga las instrucciones del mdico acerca de mantenerse hidratada, las cuales pueden incluir lo siguiente:  Hasta doshoras antes del procedimiento, puede beber lquidos transparentes, como agua, jugos de fruta sin pulpa, caf negro y t solo. Restricciones en las comidas y bebidas Siga las instrucciones del mdico respecto de las restricciones de comidas o bebidas, las cuales pueden incluir lo siguiente:  Ocho horas antes del procedimiento, deje de ingerir comidas o alimentos pesados, como carne, alimentos fritos o alimentos grasos.  Seis horas antes del procedimiento, deje de ingerir comidas o alimentos livianos, como tostadas o cereales.  Seis horas antes del procedimiento, deje de beber [place] o bebidas que AK Steel Holding Corporation.  Dos horas antes del procedimiento, deje de beber lquidos transparentes. Medicamentos Consulte al mdico si debe hacer o no lo siguiente:  Quarry manager o suspender los medicamentos que [place] habitualmente. Esto es muy importante si toma medicamentos para la diabetes o anticoagulantes.  Tomar medicamentos como aspirina e ibuprofeno. Estos medicamentos pueden tener un efecto anticoagulante en la [place]. No tome estos medicamentos a menos que el mdico se lo indique.  Usar medicamentos de venta libre, vitaminas, hierbas y suplementos. Seguridad en la ciruga Pregntele al mdico:  Cmo se Tax inspector de la Leisure centre manager.  Qu  medidas se tomarn para evitar una infeccin. Estas medidas pueden incluir las siguientes: ? Rasurar el vello del lugar de la ciruga. ? Lavar la piel con un jabn antisptico. ? Recibir antibiticos. Indicaciones generales  No consuma ningn producto que contenga nicotina ni tabaco durante al menos las 4 semanas anteriores al procedimiento. Estos productos incluyen cigarrillos, tabaco para Higher education careers adviser y aparatos de vapeo, como los Psychologist, sport and exercise. Si necesita ayuda para dejar de consumir estos productos, consulte al Mead[employer]vaco.  Haga que alguien la lleve a su casa desde el hospital.  Si va a marcharse a su casa inmediatamente despus del procedimiento, pdale a un adulto responsable que la cuide durante el tiempo que le indiquen. Esto es importante. Qu ocurre durante el procedimiento?  Le colocarn una va intravenosa (i.v.) en una vena.  Le administrarn uno o ms de los siguientes medicamentos: ? Un medicamento para ayudar a relajarse (sedante). ? Un medicamento para adormecer la zona (anestesia local). ? Un medicamento que la har dormir (anestesia general). ? Un medicamento que se inyecta en una zona del cuerpo para adormecer toda la regin que se encuentra por debajo del sitio de la inyeccin (anestesia regional).  Es posible que le vacen la vejiga con una pequea sonda (catter).  Si le administran anestesia general, le introducirn un tubo en la garganta para ayudarla a respirar.  Le harn dos incisiones pequeas, una en la parte inferior del abdomen y Ardelia Mems cerca del ombligo.  Le inflarn el abdomen con un gas. Esto permitir que el cirujano vea mejor y le dar ms espacio para Fish farm manager.  Le introducirn en el  abdomen un tubo iluminado (laparoscopio) con Carlota Raspberry a travs de una de las incisiones. A travs de la otra incisin, le introducirn instrumentos pequeos.  Las trompas de The Northwestern Mutual se atarn o Marketing executive (cauterizarn), o se obstruirn con un clip, un anillo o una  abrazadera. Es posible que se extraiga una parte pequea en el centro de cada trompa de [place].  Se le extraer el gas del abdomen.  Las incisiones se cerrarn con puntos (suturas).  Se colocar una venda (vendaje) sobre las incisiones. El procedimiento puede variar segn el mdico y el hospital.      Sander Nephew ocurre despus del procedimiento?  Le controlarn la presin arterial, la frecuencia cardaca, la frecuencia respiratoria y el nivel de oxgeno en la sangre hasta que se vaya del hospital.  Marin Comment administrarn medicamentos para Best boy, las nuseas y los vmitos, segn sea necesario.  Despus del procedimiento, puede tener secrecin vaginal. Es posible que deba usar un apsito sanitario.  Si le administraron un sedante durante el procedimiento, puede afectarlo por varias horas. No conduzca ni opere maquinaria hasta que el mdico le indique que es seguro [place]. Resumen  La ligadura de trompas laparoscpica es un procedimiento que se realiza para Patent attorney.  No debe realizarse este procedimiento si quiere quedar Sales promotion account executive o si no est segura de si desea tener ms hijos.  El procedimiento se realiza usando un tubo delgado iluminado (laparoscopio) con Ardelia Mems cmara que se introduce en el abdomen. a travs de una incisin.  Despus del procedimiento, le administrarn medicamentos para Best boy, las nuseas y los vmitos, segn sea necesario.  Haga que alguien la lleve a su casa desde el hospital. Esta informacin no tiene Marine scientist el consejo del mdico. Asegrese de hacerle al mdico cualquier pregunta que tenga. Document Revised: 06/03/2020 Document Reviewed: 06/03/2020 Elsevier Patient Education  2021 Reynolds [employer].

## 2020-09-04 NOTE — Progress Notes (Signed)
Patient given informed consent, signed copy in the chart, time out was performed.  Placed in lithotomy position. Cervix viewed with speculum and colposcope after application of acetic acid.   Colposcopy adequate?  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 no Mosaicism?  no Abnormal vasculature?  no Biopsies? Yes at 6 and 11 o'clock ECC? yes  COMMENTS:  Patient was given post procedure instructions.    Griffin Basil, MD  Faculty Attending Center for [facility]
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
## 2020-09-08 LAB — SURGICAL PATHOLOGY

## 2020-09-17 ENCOUNTER — Encounter (HOSPITAL_BASED_OUTPATIENT_CLINIC_OR_DEPARTMENT_OTHER): Payer: Self-pay | Admitting: Obstetrics and Gynecology

## 2020-09-17 ENCOUNTER — Other Ambulatory Visit: Payer: Self-pay

## 2020-09-18 NOTE — BH Specialist Note (Addendum)
Integrated Behavioral Health via Telemedicine Visit  09/18/2020 [place] [phone]   Pt did not arrive to video visit and did not answer the phone ; Left HIPPA-compliant message to call back Roselyn Reef from [facility] [employer] at [facility] at [phone] (main office) or [phone] ([place] office) at patient's number on [phone]), via [place] interpreter Sunday Spillers [phone]),  as well as pt's emergency contact number(father) [phone], via Spanish interpreter Gui[place] [phone]) ; left MyChart message for patient.

## 2020-09-20 ENCOUNTER — Other Ambulatory Visit (HOSPITAL_COMMUNITY)
Admission: RE | Admit: 2020-09-20 | Discharge: 2020-09-20 | Disposition: A | Discharge status: Home / Self Care | Payer: Medicaid Other | Source: Ambulatory Visit | Attending: Obstetrics and Gynecology | Admitting: Obstetrics and Gynecology

## 2020-09-20 DIAGNOSIS — Z20822 Contact with and (suspected) exposure to covid-19: Secondary | ICD-10-CM | POA: Insufficient documentation

## 2020-09-20 DIAGNOSIS — Z01812 Encounter for preprocedural laboratory examination: Secondary | ICD-10-CM | POA: Insufficient documentation

## 2020-09-21 LAB — SARS CORONAVIRUS 2 (TAT 6-24 HRS): SARS Coronavirus 2: NEGATIVE

## 2020-09-23 NOTE — Progress Notes (Signed)
Reminded pt to come in for lab work and she states is coming today.

## 2020-10-02 ENCOUNTER — Ambulatory Visit: Payer: Medicaid Other | Admitting: Clinical

## 2020-10-02 DIAGNOSIS — Z91199 Patient's noncompliance with other medical treatment and regimen due to unspecified reason: Secondary | ICD-10-CM

## 2020-10-02 DIAGNOSIS — Z5329 Procedure and treatment not carried out because of patient's decision for other reasons: Secondary | ICD-10-CM

## 2020-10-14 ENCOUNTER — Encounter (HOSPITAL_BASED_OUTPATIENT_CLINIC_OR_DEPARTMENT_OTHER): Payer: Self-pay | Admitting: *Deleted

## 2020-10-16 ENCOUNTER — Encounter (HOSPITAL_BASED_OUTPATIENT_CLINIC_OR_DEPARTMENT_OTHER): Payer: Self-pay | Admitting: Obstetrics and Gynecology

## 2020-10-16 ENCOUNTER — Other Ambulatory Visit: Payer: Self-pay

## 2020-10-18 ENCOUNTER — Other Ambulatory Visit (HOSPITAL_COMMUNITY)
Admission: RE | Admit: 2020-10-18 | Discharge: 2020-10-18 | Disposition: A | Discharge status: Home / Self Care | Payer: Medicaid Other | Source: Ambulatory Visit | Attending: Obstetrics and Gynecology | Admitting: Obstetrics and Gynecology

## 2020-10-18 DIAGNOSIS — Z01812 Encounter for preprocedural laboratory examination: Secondary | ICD-10-CM | POA: Diagnosis not present

## 2020-10-18 DIAGNOSIS — Z20822 Contact with and (suspected) exposure to covid-19: Secondary | ICD-10-CM | POA: Diagnosis not present

## 2020-10-18 LAB — SARS CORONAVIRUS 2 (TAT 6-24 HRS): SARS Coronavirus 2: NEGATIVE

## 2020-10-20 ENCOUNTER — Encounter (HOSPITAL_BASED_OUTPATIENT_CLINIC_OR_DEPARTMENT_OTHER)
Admission: RE | Admit: 2020-10-20 | Discharge: 2020-10-20 | Disposition: A | Discharge status: Home / Self Care | Payer: Medicaid Other | Source: Ambulatory Visit | Attending: Obstetrics and Gynecology | Admitting: Obstetrics and Gynecology

## 2020-10-20 DIAGNOSIS — Z01812 Encounter for preprocedural laboratory examination: Secondary | ICD-10-CM | POA: Insufficient documentation

## 2020-10-20 DIAGNOSIS — Z833 Family history of diabetes mellitus: Secondary | ICD-10-CM | POA: Diagnosis not present

## 2020-10-20 DIAGNOSIS — Z302 Encounter for sterilization: Secondary | ICD-10-CM | POA: Diagnosis present

## 2020-10-20 DIAGNOSIS — Z791 Long term (current) use of non-steroidal anti-inflammatories (NSAID): Secondary | ICD-10-CM | POA: Diagnosis not present

## 2020-10-20 DIAGNOSIS — Z8349 Family history of other endocrine, nutritional and metabolic diseases: Secondary | ICD-10-CM | POA: Diagnosis not present

## 2020-10-20 DIAGNOSIS — Z8249 Family history of ischemic heart disease and other diseases of the circulatory system: Secondary | ICD-10-CM | POA: Diagnosis not present

## 2020-10-20 DIAGNOSIS — Z881 Allergy status to other antibiotic agents status: Secondary | ICD-10-CM | POA: Diagnosis not present

## 2020-10-20 LAB — CBC
HCT: 39.7 % (ref 36.0–46.0)
Hemoglobin: 12.7 g/dL (ref 12.0–15.0)
MCH: 27.8 pg (ref 26.0–34.0)
MCHC: 32 g/dL (ref 30.0–36.0)
MCV: 86.9 fL (ref 80.0–100.0)
Platelets: 265 10*3/uL (ref 150–400)
RBC: 4.57 MIL/uL (ref 3.87–5.11)
RDW: 14.6 % (ref 11.5–15.5)
WBC: 4.7 10*3/uL (ref 4.0–10.5)
nRBC: 0 % (ref 0.0–0.2)

## 2020-10-20 LAB — TYPE AND SCREEN
ABO/RH(D): A POS
Antibody Screen: NEGATIVE

## 2020-10-20 LAB — POCT PREGNANCY, URINE: Preg Test, Ur: NEGATIVE

## 2020-10-22 ENCOUNTER — Encounter (HOSPITAL_BASED_OUTPATIENT_CLINIC_OR_DEPARTMENT_OTHER): Payer: Self-pay | Admitting: Obstetrics and Gynecology

## 2020-10-22 ENCOUNTER — Ambulatory Visit (HOSPITAL_BASED_OUTPATIENT_CLINIC_OR_DEPARTMENT_OTHER)
Admission: RE | Admit: 2020-10-22 | Discharge: 2020-10-22 | Disposition: A | Discharge status: Home / Self Care | Payer: Medicaid Other | Attending: Obstetrics and Gynecology | Admitting: Obstetrics and Gynecology

## 2020-10-22 ENCOUNTER — Encounter (HOSPITAL_BASED_OUTPATIENT_CLINIC_OR_DEPARTMENT_OTHER)
Admission: RE | Disposition: A | Discharge status: Home / Self Care | Payer: Self-pay | Source: Home / Self Care | Attending: Obstetrics and Gynecology

## 2020-10-22 ENCOUNTER — Other Ambulatory Visit: Payer: Self-pay

## 2020-10-22 ENCOUNTER — Ambulatory Visit (HOSPITAL_BASED_OUTPATIENT_CLINIC_OR_DEPARTMENT_OTHER): Payer: Medicaid Other | Admitting: Certified Registered"

## 2020-10-22 DIAGNOSIS — Z833 Family history of diabetes mellitus: Secondary | ICD-10-CM | POA: Diagnosis not present

## 2020-10-22 DIAGNOSIS — Z302 Encounter for sterilization: Secondary | ICD-10-CM | POA: Insufficient documentation

## 2020-10-22 DIAGNOSIS — Z8249 Family history of ischemic heart disease and other diseases of the circulatory system: Secondary | ICD-10-CM | POA: Diagnosis not present

## 2020-10-22 DIAGNOSIS — Z3009 Encounter for other general counseling and advice on contraception: Secondary | ICD-10-CM

## 2020-10-22 DIAGNOSIS — Z881 Allergy status to other antibiotic agents status: Secondary | ICD-10-CM | POA: Insufficient documentation

## 2020-10-22 DIAGNOSIS — Z791 Long term (current) use of non-steroidal anti-inflammatories (NSAID): Secondary | ICD-10-CM | POA: Insufficient documentation

## 2020-10-22 DIAGNOSIS — Z8349 Family history of other endocrine, nutritional and metabolic diseases: Secondary | ICD-10-CM | POA: Diagnosis not present

## 2020-10-22 HISTORY — PX: LAPAROSCOPIC TUBAL LIGATION: SHX1937

## 2020-10-22 HISTORY — DX: Depression, unspecified: F32.A

## 2020-10-22 LAB — ABO/RH: ABO/RH(D): A POS

## 2020-10-22 SURGERY — LIGATION, FALLOPIAN TUBE, LAPAROSCOPIC
Anesthesia: General | Site: Abdomen

## 2020-10-22 MED ORDER — SOD CITRATE-CITRIC ACID 500-334 MG/5ML PO SOLN
ORAL | Status: AC
  Filled 2020-10-22: qty 15

## 2020-10-22 MED ORDER — SUGAMMADEX SODIUM 200 MG/2ML IV SOLN
INTRAVENOUS | Status: DC | PRN
  Administered 2020-10-22: 300 mg via INTRAVENOUS

## 2020-10-22 MED ORDER — OXYCODONE HCL 5 MG/5ML PO SOLN: 5.0000 mg | Freq: Once | ORAL | Status: DC | PRN

## 2020-10-22 MED ORDER — PROPOFOL 10 MG/ML IV BOLUS
INTRAVENOUS | Status: AC
  Filled 2020-10-22: qty 20

## 2020-10-22 MED ORDER — LACTATED RINGERS IV SOLN: INTRAVENOUS | Status: DC

## 2020-10-22 MED ORDER — HYDROMORPHONE HCL 1 MG/ML IJ SOLN: 0.2500 mg | INTRAMUSCULAR | Status: DC | PRN

## 2020-10-22 MED ORDER — KETOROLAC TROMETHAMINE 30 MG/ML IJ SOLN
INTRAMUSCULAR | Status: AC
  Filled 2020-10-22: qty 1

## 2020-10-22 MED ORDER — ROCURONIUM BROMIDE 10 MG/ML (PF) SYRINGE
PREFILLED_SYRINGE | INTRAVENOUS | Status: AC
  Filled 2020-10-22: qty 10

## 2020-10-22 MED ORDER — OXYCODONE HCL 5 MG PO TABS: 5.0000 mg | ORAL_TABLET | ORAL | 0 refills | Status: AC | PRN

## 2020-10-22 MED ORDER — MEPERIDINE HCL 25 MG/ML IJ SOLN
6.2500 mg | INTRAMUSCULAR | Status: DC | PRN
Start: 2020-10-22 — End: 2020-10-22

## 2020-10-22 MED ORDER — OXYCODONE HCL 5 MG PO TABS: 5.0000 mg | ORAL_TABLET | Freq: Once | ORAL | Status: DC | PRN

## 2020-10-22 MED ORDER — BUPIVACAINE HCL 0.5 % IJ SOLN
INTRAMUSCULAR | Status: DC | PRN
  Administered 2020-10-22: 10 mL

## 2020-10-22 MED ORDER — ROCURONIUM BROMIDE 100 MG/10ML IV SOLN
INTRAVENOUS | Status: DC | PRN
  Administered 2020-10-22: 70 mg via INTRAVENOUS

## 2020-10-22 MED ORDER — FENTANYL CITRATE (PF) 100 MCG/2ML IJ SOLN
INTRAMUSCULAR | Status: AC
  Filled 2020-10-22: qty 2

## 2020-10-22 MED ORDER — MIDAZOLAM HCL 2 MG/2ML IJ SOLN
INTRAMUSCULAR | Status: AC
  Filled 2020-10-22: qty 2

## 2020-10-22 MED ORDER — CEFAZOLIN SODIUM-DEXTROSE 2-4 GM/100ML-% IV SOLN
2.0000 g | INTRAVENOUS | Status: AC
  Administered 2020-10-22: 2 g via INTRAVENOUS

## 2020-10-22 MED ORDER — DEXAMETHASONE SODIUM PHOSPHATE 10 MG/ML IJ SOLN
INTRAMUSCULAR | Status: AC
  Filled 2020-10-22: qty 1

## 2020-10-22 MED ORDER — MIDAZOLAM HCL 5 MG/5ML IJ SOLN
INTRAMUSCULAR | Status: DC | PRN
  Administered 2020-10-22: 2 mg via INTRAVENOUS

## 2020-10-22 MED ORDER — ONDANSETRON HCL 4 MG/2ML IJ SOLN
INTRAMUSCULAR | Status: AC
  Filled 2020-10-22: qty 2

## 2020-10-22 MED ORDER — KETOROLAC TROMETHAMINE 30 MG/ML IJ SOLN
INTRAMUSCULAR | Status: DC | PRN
  Administered 2020-10-22: 30 mg via INTRAVENOUS

## 2020-10-22 MED ORDER — BUPIVACAINE HCL (PF) 0.5 % IJ SOLN
INTRAMUSCULAR | Status: AC
  Filled 2020-10-22: qty 30

## 2020-10-22 MED ORDER — SUGAMMADEX SODIUM 500 MG/5ML IV SOLN
INTRAVENOUS | Status: AC
  Filled 2020-10-22: qty 5

## 2020-10-22 MED ORDER — 0.9 % SODIUM CHLORIDE (POUR BTL) OPTIME
TOPICAL | Status: DC | PRN
  Administered 2020-10-22: 1000 mL

## 2020-10-22 MED ORDER — SOD CITRATE-CITRIC ACID 500-334 MG/5ML PO SOLN
30.0000 mL | ORAL | Status: AC
  Administered 2020-10-22: 30 mL via ORAL

## 2020-10-22 MED ORDER — LIDOCAINE 2% (20 MG/ML) 5 ML SYRINGE
INTRAMUSCULAR | Status: DC | PRN
  Administered 2020-10-22: 60 mg via INTRAVENOUS

## 2020-10-22 MED ORDER — DEXAMETHASONE SODIUM PHOSPHATE 10 MG/ML IJ SOLN
INTRAMUSCULAR | Status: DC | PRN
  Administered 2020-10-22: 10 mg via INTRAVENOUS

## 2020-10-22 MED ORDER — PROMETHAZINE HCL 25 MG/ML IJ SOLN: 6.2500 mg | INTRAMUSCULAR | Status: DC | PRN

## 2020-10-22 MED ORDER — LIDOCAINE 2% (20 MG/ML) 5 ML SYRINGE
INTRAMUSCULAR | Status: AC
  Filled 2020-10-22: qty 5

## 2020-10-22 MED ORDER — POVIDONE-IODINE 10 % EX SWAB: 2.0000 "application " | Freq: Once | CUTANEOUS | Status: DC

## 2020-10-22 MED ORDER — CEFAZOLIN SODIUM-DEXTROSE 2-4 GM/100ML-% IV SOLN
INTRAVENOUS | Status: AC
  Filled 2020-10-22: qty 100

## 2020-10-22 MED ORDER — AMISULPRIDE (ANTIEMETIC) 5 MG/2ML IV SOLN: 10.0000 mg | Freq: Once | INTRAVENOUS | Status: DC | PRN

## 2020-10-22 MED ORDER — PROPOFOL 10 MG/ML IV BOLUS
INTRAVENOUS | Status: DC | PRN
Start: 2020-10-22 — End: 2020-10-22
  Administered 2020-10-22: 150 mg via INTRAVENOUS

## 2020-10-22 MED ORDER — FENTANYL CITRATE (PF) 100 MCG/2ML IJ SOLN
INTRAMUSCULAR | Status: DC | PRN
  Administered 2020-10-22: 100 ug via INTRAVENOUS

## 2020-10-22 MED ORDER — ONDANSETRON HCL 4 MG/2ML IJ SOLN
INTRAMUSCULAR | Status: DC | PRN
  Administered 2020-10-22: 4 mg via INTRAVENOUS

## 2020-10-22 SURGICAL SUPPLY — 37 items
APPLICATOR ARISTA FLEXITIP XL (MISCELLANEOUS) IMPLANT
CATH ROBINSON RED A/P 16FR (CATHETERS) IMPLANT
COVER WAND RF STERILE (DRAPES) ×2 IMPLANT
DERMABOND ADVANCED (GAUZE/BANDAGES/DRESSINGS) ×1
DERMABOND ADVANCED .7 DNX12 (GAUZE/BANDAGES/DRESSINGS) ×1 IMPLANT
DRSG OPSITE POSTOP 3X4 (GAUZE/BANDAGES/DRESSINGS) IMPLANT
DURAPREP 26ML APPLICATOR (WOUND CARE) ×2 IMPLANT
ELECT REM PT RETURN 9FT ADLT (ELECTROSURGICAL) ×2
ELECTRODE REM PT RTRN 9FT ADLT (ELECTROSURGICAL) ×1 IMPLANT
GAUZE 4X4 16PLY RFD (DISPOSABLE) ×2 IMPLANT
GLOVE SRG 8 PF TXTR STRL LF DI (GLOVE) ×1 IMPLANT
GLOVE SURG ENC MOIS LTX SZ8 (GLOVE) ×2 IMPLANT
GLOVE SURG UNDER POLY LF SZ7 (GLOVE) ×4 IMPLANT
GLOVE SURG UNDER POLY LF SZ8 (GLOVE) ×1
GOWN STRL REUS W/TWL LRG LVL3 (GOWN DISPOSABLE) ×2 IMPLANT
GOWN STRL REUS W/TWL XL LVL3 (GOWN DISPOSABLE) ×2 IMPLANT
HEMOSTAT ARISTA ABSORB 3G PWDR (HEMOSTASIS) IMPLANT
KIT TURNOVER KIT B (KITS) ×2 IMPLANT
LIGASURE VESSEL 5MM BLUNT TIP (ELECTROSURGICAL) ×2 IMPLANT
NS IRRIG 1000ML POUR BTL (IV SOLUTION) ×2 IMPLANT
PACK LAPAROSCOPY BASIN (CUSTOM PROCEDURE TRAY) ×2 IMPLANT
PACK TRENDGUARD 450 HYBRID PRO (MISCELLANEOUS) IMPLANT
PAD ARMBOARD 7.5X6 YLW CONV (MISCELLANEOUS) ×2 IMPLANT
PAD OB MATERNITY 4.3X12.25 (PERSONAL CARE ITEMS) ×2 IMPLANT
PAD PREP 24X48 CUFFED NSTRL (MISCELLANEOUS) ×2 IMPLANT
PENCIL SMOKE EVACUATOR (MISCELLANEOUS) ×2 IMPLANT
SET TUBE SMOKE EVAC HIGH FLOW (TUBING) ×2 IMPLANT
SLEEVE SCD COMPRESS KNEE MED (STOCKING) ×2 IMPLANT
SUT MNCRL AB 4-0 PS2 18 (SUTURE) ×2 IMPLANT
SUT VICRYL 0 UR6 27IN ABS (SUTURE) ×4 IMPLANT
TOWEL GREEN STERILE FF (TOWEL DISPOSABLE) ×4 IMPLANT
TRAY FOLEY W/BAG SLVR 14FR LF (SET/KITS/TRAYS/PACK) ×2 IMPLANT
TRENDGUARD 450 HYBRID PRO PACK (MISCELLANEOUS)
TROCAR BALLN 12MMX100 BLUNT (TROCAR) IMPLANT
TROCAR XCEL NON-BLD 11X100MML (ENDOMECHANICALS) ×2 IMPLANT
TROCAR XCEL NON-BLD 5MMX100MML (ENDOMECHANICALS) ×2 IMPLANT
WARMER LAPAROSCOPE (MISCELLANEOUS) ×2 IMPLANT

## 2020-10-22 NOTE — H&P (Signed)
OB/GYN Pre-Op History and Physical  Andrea Barry is a 35 y.o. [id number] presenting for laparoscopic tubal ligation by bilateral salpingectomy. Patient desires permanent sterilization.  Other reversible forms of contraception (over the counter/barrier methods; hormonal contraceptives including pill, patch, ring, Depo-Provera injection, Nexplanon implant; hormonal IUDs Skyla and Mirena; nonhormonal copper IUD Paragard) were discussed with patient; she declined all these modalities. Also discussed the option of vasectomy for her female partner; she also declined this option. She was given the choice between laparoscopic bilateral tubal sterilization using Filshie clips or laparoscopic bilateral salpingectomy. For the Filshie clip sterilization, she was told she will have one incision in her umbilicus.  Failure risk of 1-2 % with increased risk of ectopic gestation if pregnancy occurs was also discussed with patient. For the bilateral salpingectomy, she was told that both tubes will be resected via three small incisions; the failure risk of less than 1%.  Any future pregnancies will have to be attempted via IVF or other fertility procedures.  Reiterated permanence and irreversibility of both procedures; in the case of Filshie clip application, attempts to reverse tubal sterilization are often not successful.  Also emphasized risk of regret which is noted more in patients less than the age of 76.  All questions were answered. She desires laparoscopic bilateral salpingectomy.  Other risks of the procedure were discussed with patient including but not limited to: bleeding, infection, injury to surrounding organs and need for additional procedures.  Also discussed possibility of post-tubal pain syndrome. Patient verbalized understanding of these risks and wants to proceed with this procedure.   Lynnda Shields, MD, [place] for [facility]        Past Medical History:  Diagnosis Date  . Allergy   . Anemia   . Cervical incompetence    2nd trimester fetal loss  . Cervical incompetence in pregnancy, second trimester 10/13/2017   21 week loss 07/2016  . Depression   . Hypothyroidism    Last TSH (04/2017) was normal, off levothyroxine for months  . Preterm labor     Past Surgical History:  Procedure Laterality Date  . CERVICAL CERCLAGE N/A 12/15/2017   Procedure: CERCLAGE CERVICAL;  Surgeon: Osborne Oman, MD;  Location: [facility];  Service: Gynecology;  Laterality: N/A;  . CERVICAL CERCLAGE N/A 01/22/2020   Procedure: CERCLAGE CERVICAL;  Surgeon: Woodroe Mode, MD;  Location: [facility];  Service: Gynecology;  Laterality: N/A;  . NO PAST SURGERIES      OB History  Gravida Para Term Preterm AB Living  6 3 2 1 2 2   SAB IAB Ectopic Multiple Live Births  2 0 0 0 2    # Outcome Date GA Lbr Len/2nd Weight Sex Delivery Anes PTL Lv  6 Gravida           5 Term 07/20/20 [redacted]w[redacted]d  3306 g M Vag-Spont EPI  LIV  4 Term 04/20/18 [redacted]w[redacted]d 08:18 / 00:14 3175 g F Vag-Spont EPI  LIV     Birth Comments: short cervix, had cerclage, PTL  3 Preterm 2017 [redacted]w[redacted]d       FD  2 SAB 2015          1 SAB 2014            Social History   Socioeconomic History  . Marital status: Single    Spouse name: Not on file  . Number of children: Not on file  .  Years of education: Not on file  . Highest education level: Bachelor's degree (e.g., BA, AB, BS)  Occupational History  . Not on file  Tobacco Use  . Smoking status: Never Smoker  . Smokeless tobacco: Never Used  Vaping Use  . Vaping Use: Never used  Substance and Sexual Activity  . Alcohol use: No  . Drug use: No  . Sexual activity: Yes    Birth control/protection: None  Other Topics Concern  . Not on file  Social History Narrative  . Not on file   Social Determinants of Health   Financial Resource Strain: Not on file  Food Insecurity: Food Insecurity Present   . Worried About Charity fundraiser in the Last Year: Sometimes true  . Ran Out of Food in the Last Year: Sometimes true  Transportation Needs: Unmet Transportation Needs  . Lack of Transportation (Medical): Yes  . Lack of Transportation (Non-Medical): Yes  Physical Activity: Not on file  Stress: Not on file  Social Connections: Not on file    Family History  Problem Relation Age of Onset  . Diabetes Mother   . Hypertension Mother   . Heart disease Mother   . Hyperlipidemia Father   . COPD Father   . Mental illness Maternal Grandmother     Medications Prior to Admission  Medication Sig Dispense Refill Last Dose  . ferrous sulfate (FERROUSUL) 325 (65 FE) MG tablet Take 1 tablet (325 mg total) by mouth 2 (two) times daily. 60 tablet 1 10/16/2020 at Unknown time  . Prenatal Vit-Fe Fumarate-FA (MULTIVITAMIN-PRENATAL) 27-0.8 MG TABS tablet Take 1 tablet by mouth daily at 12 noon. (Patient taking differently: Take 1 tablet by mouth daily.) 30 tablet 11 10/16/2020 at Unknown time  . sertraline (ZOLOFT) 100 MG tablet Take 0.5 tablets (50 mg total) by mouth daily. 30 tablet 6 10/16/2020 at Unknown time  . ibuprofen (ADVIL) 600 MG tablet TAKE 1 TABLET(600 MG) BY MOUTH EVERY 6 HOURS AS NEEDED (Patient not taking: Reported on 09/04/2020) 30 tablet 0     Allergies  Allergen Reactions  . Metronidazole Other (See Comments)    Dizziness, taste of iron in mouth, and darkened urine    Review of Systems: Negative except for what is mentioned in HPI.     Physical Exam: Ht 5\' 4"  (1.626 m)   Wt 64.4 kg   LMP 09/16/2020   Breastfeeding Unknown   BMI 24.37 kg/m  CONSTITUTIONAL: Well-developed, well-nourished female in no acute distress.  HENT:  Normocephalic, atraumatic, External right and left ear normal. Oropharynx is clear and moist EYES: Conjunctivae and EOM are normal. Pupils are equal, round, and reactive to light. No scleral icterus.  NECK: Normal range of motion, supple, no  masses SKIN: Skin is warm and dry. No rash noted. Not diaphoretic. No erythema. No pallor. Dexter: Alert and oriented to person, place, and time. Normal reflexes, muscle tone coordination. No cranial nerve deficit noted. PSYCHIATRIC: Normal mood and affect. Normal behavior. Normal judgment and thought content. CARDIOVASCULAR: Normal heart rate noted, regular rhythm RESPIRATORY: Effort and breath sounds normal, no problems with respiration noted ABDOMEN: Soft, nontender, nondistended, PELVIC: Deferred MUSCULOSKELETAL: Normal range of motion. No edema and no tenderness. 2+ distal pulses.   Pertinent Labs/Studies:   Results for orders placed or performed during the hospital encounter of 10/22/20 (from the past 72 hour(s))  Type and screen [facility]     Status: None   Collection Time: 10/20/20  1:51 PM  Result  Value Ref Range   ABO/RH(D) A POS    Antibody Screen NEG    Sample Expiration      10/23/2020,2359 Performed at [facility], [place] [address], [place], [place] [address]   CBC     Status: None   Collection Time: 10/20/20  2:30 PM  Result Value Ref Range   WBC 4.7 4.0 - 10.5 K/uL   RBC 4.57 3.87 - 5.11 MIL/uL   Hemoglobin 12.7 12.0 - 15.0 g/dL   HCT 39.7 36.0 - 46.0 %   MCV 86.9 80.0 - 100.0 fL   MCH 27.8 26.0 - 34.0 pg   MCHC 32.0 30.0 - 36.0 g/dL   RDW 14.6 11.5 - 15.5 %   Platelets 265 150 - 400 K/uL   nRBC 0.0 0.0 - 0.2 %    Comment: Performed at [facility], [place] [address] [place], [place] [address]       Assessment and Plan :Andrea Barry is a 35 y.o. [id number] here for laparoscopic bilateral salpingectomy (tubal ligation) Pt again is aware of risks and benefits  Including bleeding, infection, involvement of other organs  Particularly bladder and bowel as well as risk of regret.  Plan for  NPO Will proceed as scheduled.  Consent signed and labs reviewed.  Lynnda Shields, M.D. Attending [place], [facility] for [employer], [place]

## 2020-10-22 NOTE — Transfer of Care (Signed)
Immediate Anesthesia Transfer of Care Note  Patient: Andrea Barry [facility]  Procedure(s) Performed: LAPAROSCOPIC TUBAL LIGATION (N/A Abdomen)  Patient Location: PACU  Anesthesia Type:General  Level of Consciousness: awake, alert  and oriented  Airway & Oxygen Therapy: Patient Spontanous Breathing and Patient connected to face mask oxygen  Post-op Assessment: Report given to RN and Post -op Vital signs reviewed and stable  Post vital signs: Reviewed and stable  Last Vitals:  Vitals Value Taken Time  BP 113/66 10/22/20 1553  Temp    Pulse 93 10/22/20 1555  Resp 17 10/22/20 1555  SpO2 100 % 10/22/20 1555  Vitals shown include unvalidated device data.  Last Pain:  Vitals:   10/22/20 1316  TempSrc: Oral  PainSc: 0-No pain      Patients Stated Pain Goal: 8 (20/10/07 1219)  Complications: No complications documented.

## 2020-10-22 NOTE — Anesthesia Postprocedure Evaluation (Signed)
Anesthesia Post Note  Patient: Andrea Barry  Procedure(s) Performed: LAPAROSCOPIC TUBAL LIGATION (N/A Abdomen)     Patient location during evaluation: PACU Anesthesia Type: General Level of consciousness: awake and alert Pain management: pain level controlled Vital Signs Assessment: post-procedure vital signs reviewed and stable Respiratory status: spontaneous breathing, nonlabored ventilation and respiratory function stable Cardiovascular status: blood pressure returned to baseline and stable Postop Assessment: no apparent nausea or vomiting Anesthetic complications: no   No complications documented.  Last Vitals:  Vitals:   10/22/20 1615 10/22/20 1637  BP: 112/69 106/77  Pulse: 90 73  Resp: 19 16  Temp:  36.6 C  SpO2: 100% 100%    Last Pain:  Vitals:   10/22/20 1630  TempSrc:   PainSc: 0-No pain                 Lynda Rainwater

## 2020-10-22 NOTE — Anesthesia Procedure Notes (Signed)
Procedure Name: Intubation Date/Time: 10/22/2020 2:55 PM Performed by: Lavonia Dana, CRNA Pre-anesthesia Checklist: Patient identified, Emergency Drugs available, Suction available and Patient being monitored Patient Re-evaluated:Patient Re-evaluated prior to induction Oxygen Delivery Method: Circle system utilized Preoxygenation: Pre-oxygenation with 100% oxygen Induction Type: IV induction Ventilation: Mask ventilation without difficulty Laryngoscope Size: Mac and 3 Grade View: Grade I Tube type: Oral Tube size: 7.0 mm Number of attempts: 1 Airway Equipment and Method: Stylet and Bite block Placement Confirmation: ETT inserted through vocal cords under direct vision,  positive ETCO2 and breath sounds checked- equal and bilateral Secured at: 22 cm Tube secured with: Tape Dental Injury: Teeth and Oropharynx as per pre-operative assessment

## 2020-10-22 NOTE — Discharge Instructions (Signed)
Ligadura de trompas por laparoscopia, cuidados posteriores Laparoscopic Tubal Ligation, Care After La siguiente informacin ofrece orientacin sobre cmo cuidarse despus del procedimiento. El mdico tambin podr darle instrucciones ms especficas. Comunquese con el mdico si tiene problemas o preguntas. Qu puedo esperar despus del procedimiento? Despus del procedimiento, es normal tener los siguientes sntomas:  Dolor de garganta si se [place] anestesia general.  Dolor en los hombros, la espalda y el abdomen. Esto lo causa el gas que se [place] durante el procedimiento.  Calambres o molestias leves en el abdomen.  Dolor o molestias en la zona donde se realiz la incisin [place].  Sensacin de hinchazn.  Cansancio.  Nuseas y vmitos. Siga estas instrucciones en su casa: Medicamentos  Use los medicamentos de venta libre y los recetados solamente como se lo haya indicado el mdico.  Pregntele al mdico si el medicamento recetado: ? Hace necesario que evite conducir o usar maquinaria pesada. ? Puede causarle estreimiento. Es posible que tenga que tomar estas medidas para prevenir o tratar el estreimiento:  Electronics engineer suficiente lquido como para Theatre manager la orina de color amarillo plido.  Usar medicamentos recetados o de Radio broadcast assistant.  Consumir alimentos ricos en fibra, como frijoles, cereales integrales, y frutas y verduras frescas.  Limitar el consumo de alimentos ricos en grasa y azcares procesados, como los alimentos fritos o dulces.  No tome aspirina, ya que puede causar hemorragias. Cuidado de la incisin  Siga las instrucciones del mdico acerca del cuidado de la incisin. Asegrese de hacer lo siguiente: ? Lvese las manos con agua y jabn durante al menos 20segundos antes y despus de cambiarse la venda (vendaje). Use desinfectante para manos si no dispone de [place] y [place]. ? Cambie el vendaje como se lo haya indicado el mdico. ? No retire los puntos (suturas), la  goma para cerrar la piel o las tiras [place]. Es posible que estos cierres cutneos deban quedar puestos en la piel durante 2semanas o ms tiempo. Si los bordes de las tiras adhesivas empiezan a despegarse y Therapist, sports, puede recortar los que estn sueltos. No retire las tiras [facility] por completo a menos que el mdico se lo indique.  Controle la zona de la incisin todos los das para detectar signos de infeccin. Est atenta a los siguientes signos: ? Enrojecimiento, hinchazn o ms dolor. ? Lquido o sangre. ? Calor. ? Pus o mal olor.      Actividad  Haga reposo como se lo haya indicado el mdico.  Evite estar sentada durante largos perodos sin moverse. Levntese y camine un poco cada 1 a 2 horas. Esto es importante para mejorar el flujo sanguneo y la respiracin. Pida ayuda si se siente dbil o inestable.  No mantenga relaciones sexuales, no se haga duchas vaginales ni se coloque un tampn o cualquier otro objeto en la vagina durante 6semanas o el tiempo que le haya indicado el mdico.  No levante ningn objeto que sea ms pesado que su beb, o el lmite de peso que le hayan indicado, durante 2semanas o hasta que el mdico le diga que puede [place].  No tome baos de inmersin, no nade ni use el jacuzzi hasta que el mdico lo autorice. Pregntele al mdico si puede ducharse. Thurston Pounds solo le permitan darse baos de [place].  Retome sus actividades normales segn lo indicado por el mdico. Pregntele al mdico qu actividades son seguras para usted. Indicaciones generales  Despus del procedimiento, es posible que deba usar un apsito sanitario para las secreciones vaginales.  Pida a alguien que la ayude con las tareas [employer] diarias durante los primeros [place].  Cumpla con todas las visitas de seguimiento. Esto es importante. Comunquese con un mdico si:  Tiene enrojecimiento, hinchazn o ms dolor alrededor de la incisin.  La incisin est caliente al tacto.  Tiene  pus o percibe que sale mal olor de su incisin.  Se le abren los bordes de la incisin despus de que le hayan sacado las suturas.  El dolor no mejora despus de 2a 3das.  Tiene una erupcin cutnea.  Tiene mareos o sensacin de desvanecimiento frecuentes.  El medicamento no Production designer, theatre/television/film. Solicite ayuda de inmediato si:  Tiene fiebre o escalofros.  Se desmaya.  Siente ms dolor en el abdomen.  Tiene un dolor intenso en uno o ambos hombros.  Observa lquido o sangre que sale de las suturas o sangrado abundante de la vagina.  Le falta el aire o tiene dificultad para respirar.  Tiene dolor torcico, dolor en las [place] piernas.  Tiene nuseas, vmitos o diarrea de forma continua. Estos sntomas pueden representar un problema grave que constituye Engineer, maintenance (IT). No espere a ver si los sntomas desaparecen. Solicite atencin mdica de inmediato. Comunquese con el servicio de emergencias de su localidad (911 en los [place]). No conduzca por sus propios medios [employer] hospital. Resumen  Despus del procedimiento, es frecuente sentir calambres o molestias leves en el abdomen.  Despus del procedimiento, es posible que deba usar un apsito sanitario para las secreciones vaginales.  Use los medicamentos de venta libre y los recetados solamente como se lo haya indicado el mdico.  Est atenta a los sntomas por los cuales debe comunicarse con su mdico.  Cumpla con todas las visitas de seguimiento. Esto es importante. Esta informacin no tiene Marine scientist el consejo del mdico. Asegrese de hacerle al mdico cualquier pregunta que tenga. Document Revised: 06/03/2020 Document Reviewed: 06/03/2020 Elsevier Patient Education  2021 [place].  Next dose of NSAID (Ibuprofen, Aleve, Motrin) can be given after 9:35PM.    Post Anesthesia Home Care Instructions  Activity: Get plenty of rest for the remainder of the day. A responsible  individual must stay with you for 24 hours following the procedure.  For the next 24 hours, DO NOT: -Drive a car -Paediatric nurse -Drink alcoholic beverages -Take any medication unless instructed by your physician -Make any legal decisions or sign important papers.  Meals: Start with liquid foods such as gelatin or soup. Progress to regular foods as tolerated. Avoid greasy, spicy, heavy foods. If nausea and/or vomiting occur, drink only clear liquids until the nausea and/or vomiting subsides. Call your physician if vomiting continues.  Special Instructions/Symptoms: Your throat may feel dry or sore from the anesthesia or the breathing tube placed in your throat during surgery. If this causes discomfort, gargle with warm salt water. The discomfort should disappear within 24 hours.  If you had a scopolamine patch placed behind your ear for the management of post- operative nausea and/or vomiting:  1. The medication in the patch is effective for 72 hours, after which it should be removed.  Wrap patch in a tissue and discard in the trash. Wash hands thoroughly with soap and water. 2. You may remove the patch earlier than 72 hours if you experience unpleasant side effects which may include dry mouth, dizziness or visual disturbances. 3. Avoid touching the patch. Wash your hands with soap and water after contact with the patch.

## 2020-10-22 NOTE — Anesthesia Preprocedure Evaluation (Signed)
Anesthesia Evaluation  Patient identified by MRN, date of birth, ID band Patient awake    Reviewed: Allergy & Precautions, NPO status , Patient's Chart, lab work & pertinent test results  Airway Mallampati: I  TM Distance: >3 FB Neck ROM: Full    Dental no notable dental hx.    Pulmonary neg pulmonary ROS,    Pulmonary exam normal breath sounds clear to auscultation       Cardiovascular Exercise Tolerance: Good negative cardio ROS Normal cardiovascular exam Rhythm:Regular Rate:Normal     Neuro/Psych Anxiety Depression negative neurological ROS     GI/Hepatic negative GI ROS, Neg liver ROS,   Endo/Other  Hypothyroidism   Renal/GU negative Renal ROS     Musculoskeletal negative musculoskeletal ROS (+)   Abdominal Normal abdominal exam  (+) - obese,   Peds  Hematology  (+) Blood dyscrasia, anemia , Plt 230k    Anesthesia Other Findings   Reproductive/Obstetrics Short cervix                              Anesthesia Physical  Anesthesia Plan  ASA: II  Anesthesia Plan: General   Post-op Pain Management:    Induction: Intravenous  PONV Risk Score and Plan: 3 and Ondansetron, Dexamethasone, Midazolam and Treatment may vary due to age or medical condition  Airway Management Planned: Oral ETT  Additional Equipment:   Intra-op Plan:   Post-operative Plan: Extubation in OR  Informed Consent: I have reviewed the patients History and Physical, chart, labs and discussed the procedure including the risks, benefits and alternatives for the proposed anesthesia with the patient or authorized representative who has indicated his/her understanding and acceptance.     Dental advisory given  Plan Discussed with: CRNA  Anesthesia Plan Comments:         Anesthesia Quick Evaluation

## 2020-10-22 NOTE — Op Note (Signed)
[place] PROCEDURE DATE: 10/22/2020   PREOPERATIVE DIAGNOSIS:  Undesired fertility  POSTOPERATIVE DIAGNOSIS:  Undesired fertility  PROCEDURE:  Laparoscopic Bilateral Salpingectomy   SURGEON:  Dr. Lynnda Shields  ASSISTANT:  n/a  ANESTHESIA:  General endotracheal  COMPLICATIONS:  None immediate.  ESTIMATED BLOOD LOSS:  10 ml.  FLUIDS: 1100 ml LR.  URINE OUTPUT:  300 ml of clear urine.  INDICATIONS: 35 y.o. [id number] with undesired fertility, desires permanent sterilization. Other reversible forms of contraception were discussed with patient; she declines all other modalities.  Risks of procedure discussed with patient including permanence of method, risk of regret, bleeding, infection, injury to surrounding organs and need for additional procedures including laparotomy.  Failure risk less than 0.5% with increased risk of ectopic gestation if pregnancy occurs was also discussed with patient.  Written informed consent was obtained.    FINDINGS:  Normal uterus, fallopian tubes, and ovaries.  TECHNIQUE:  The patient was taken to the operating room where general anesthesia was obtained without difficulty.  She was then placed in the dorsal lithotomy position and prepared and draped in sterile fashion.  After an adequate timeout was performed, a bivalved speculum was then placed in the patient's vagina, and the anterior lip of cervix grasped with the single-tooth tenaculum.  The [place]ka uterine manipulator was then advanced into the uterus and secured.  The speculum was removed from the vagina as was the tenaculum.  Attention was then turned to the patient's abdomen where a 6-mm skin incision was made in the umbilical fold.  The Optiview 5-mm trocar and sleeve were then advanced without difficulty with the laparoscope under direct visualization into the abdomen.  The abdomen was then insufflated with carbon dioxide gas.  Adequate pneumoperitoneum was obtained.  A survey of the  patient's pelvis and abdomen revealed the findings above. Bilateral 5-mm lower quadrant ports were then placed under direct visualization.  The fallopian tubes were transected from the uterine attachments and the underlying mesosalpinx with the LigaSure device allowing for bilateral salpingectomy.  The fallopian tubes were then removed from the abdomen under direct visualization.  The operative site was surveyed, and it was found to be hemostatic.   No intraoperative injury to other surrounding organs was noted.  The abdomen was desufflated and all instruments were then removed from the patient's abdomen.  All skin incisions were closed with  Dermabond.  The uterine manipulator was removed from the cervix without complications. The patient tolerated the procedure well.  Sponge, lap, and needle counts were correct times two.  The patient was then taken to the recovery room awake, extubated and in stable condition.  The patient will be discharged to home as per PACU criteria.  Routine postoperative instructions given.  She was prescribed oxycodone and Ibuprofen .  She will follow up in the clinic in 3 weeks for postoperative evaluation.   Lynnda Shields, MD, [place] Attending [place], [place]

## 2020-10-23 ENCOUNTER — Encounter: Payer: Self-pay | Admitting: *Deleted

## 2020-10-24 ENCOUNTER — Encounter (HOSPITAL_BASED_OUTPATIENT_CLINIC_OR_DEPARTMENT_OTHER): Payer: Self-pay | Admitting: Obstetrics and Gynecology

## 2020-10-24 LAB — SURGICAL PATHOLOGY

## 2020-11-25 ENCOUNTER — Encounter: Payer: Self-pay | Admitting: *Deleted

## 2021-01-12 ENCOUNTER — Ambulatory Visit (INDEPENDENT_AMBULATORY_CARE_PROVIDER_SITE_OTHER): Payer: Medicaid Other | Admitting: Clinical

## 2021-01-12 DIAGNOSIS — Z658 Other specified problems related to psychosocial circumstances: Secondary | ICD-10-CM

## 2021-01-12 DIAGNOSIS — F332 Major depressive disorder, recurrent severe without psychotic features: Secondary | ICD-10-CM
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
## 2021-01-13 ENCOUNTER — Telehealth: Payer: Self-pay | Admitting: Clinical
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
## 2021-01-13 NOTE — Telephone Encounter (Signed)
Attempt f/u call, as agreed-upon by [facility] and pt: Left HIPPA-compliant message to call back Roselyn Reef from [employer] for [employer] at [facility] at [phone] [facility] office), via [place], [place], [place].

## 2021-01-15 ENCOUNTER — Telehealth: Payer: Self-pay | Admitting: Clinical

## 2021-01-15 NOTE — Telephone Encounter (Signed)
Left HIPPA-compliant message to call back Roselyn Reef from [employer] for [employer] at [facility] at [phone] [facility] office), via [employer], [place], [place], [place].   Left HIPPA-compliant message to call back Roselyn Reef from Center for [employer] at [facility] at [phone] [facility] office), via [employer], [place], [place], [place], on pt's emergency phone contact number (her father).

## 2021-01-26 ENCOUNTER — Ambulatory Visit (INDEPENDENT_AMBULATORY_CARE_PROVIDER_SITE_OTHER): Payer: Medicaid Other | Admitting: Clinical

## 2021-01-26 DIAGNOSIS — F332 Major depressive disorder, recurrent severe without psychotic features: Secondary | ICD-10-CM | POA: Diagnosis not present

## 2021-01-26 DIAGNOSIS — T7491XA Unspecified adult maltreatment, confirmed, initial encounter: Secondary | ICD-10-CM

## 2021-01-26 DIAGNOSIS — F430 Acute stress reaction: Secondary | ICD-10-CM

## 2021-02-02 ENCOUNTER — Telehealth: Payer: Self-pay | Admitting: Clinical

## 2021-02-02 NOTE — Telephone Encounter (Signed)
Attempt follow-up call, as agreed upon by [facility] and pt; Left HIPPA-compliant message to call back Roselyn Reef from [employer] for [employer] at [facility] at  [phone] [facility] office), through [place], [place], [place]

## 2021-02-24 ENCOUNTER — Other Ambulatory Visit: Payer: Medicaid Other

## 2021-03-19 ENCOUNTER — Ambulatory Visit: Payer: Medicaid Other | Admitting: Obstetrics & Gynecology

## 2021-06-25 ENCOUNTER — Other Ambulatory Visit: Payer: Self-pay

## 2021-06-25 ENCOUNTER — Ambulatory Visit (INDEPENDENT_AMBULATORY_CARE_PROVIDER_SITE_OTHER): Payer: Medicaid Other | Admitting: Obstetrics and Gynecology

## 2021-06-25 ENCOUNTER — Encounter: Payer: Self-pay | Admitting: Obstetrics and Gynecology

## 2021-06-25 VITALS — BP 114/68 | HR 73 | Ht 64.0 in | Wt 140.0 lb

## 2021-06-25 DIAGNOSIS — R102 Pelvic and perineal pain: Secondary | ICD-10-CM | POA: Diagnosis not present

## 2021-06-25 NOTE — Progress Notes (Signed)
Pt states that she has been having some "uterine" pain and a bulge from her vagina since her delivery 11 months ago.  Pt describes pain as contraction like, worse than period cramps.  Pt also has pain with intercourse.

## 2021-06-25 NOTE — Progress Notes (Signed)
Pt presents with pelvic pain and "vaginal bulge". Pain is every day, worsens with intercourse. No change with cycles. Cycles monthly, not heavy or painful. Only feels "bulge" when she wipes. Has not had to push back into the vagina. Denies any problems with constipation or urinary incont. But does report dysuria at times  TSVD @ 1 yr ago H/O bilateral salpingectomy 3/22.  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  A/P Pelvic pain  Will check Urine culture and GYN U/S. F/U in 3-4 weeks to review results.

## 2021-06-28 LAB — URINE CULTURE: Organism ID, Bacteria: NO GROWTH

## 2021-06-30 ENCOUNTER — Ambulatory Visit (HOSPITAL_BASED_OUTPATIENT_CLINIC_OR_DEPARTMENT_OTHER)
Admission: RE | Admit: 2021-06-30 | Discharge: 2021-06-30 | Disposition: A | Discharge status: Home / Self Care | Payer: Medicaid Other | Source: Ambulatory Visit | Attending: Obstetrics and Gynecology | Admitting: Obstetrics and Gynecology

## 2021-06-30 ENCOUNTER — Telehealth: Payer: Self-pay | Admitting: Family Medicine

## 2021-06-30 ENCOUNTER — Other Ambulatory Visit: Payer: Self-pay

## 2021-06-30 DIAGNOSIS — R102 Pelvic and perineal pain: Secondary | ICD-10-CM | POA: Insufficient documentation

## 2021-06-30 NOTE — Telephone Encounter (Signed)
Received after hours call from [facility] regarding patient's Ultrasound. 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 Patient appears to be asymptomatic. Will forward to primary doctor for follow up.  Truett Mainland, DO 06/30/2021 4:42 PM

## 2021-07-07 ENCOUNTER — Encounter: Payer: Self-pay | Admitting: Obstetrics and Gynecology

## 2021-07-08 ENCOUNTER — Other Ambulatory Visit: Payer: Self-pay

## 2021-07-08 ENCOUNTER — Other Ambulatory Visit: Payer: Self-pay | Admitting: Obstetrics and Gynecology

## 2021-07-08 DIAGNOSIS — R1900 Intra-abdominal and pelvic swelling, mass and lump, unspecified site: Secondary | ICD-10-CM

## 2021-07-08 DIAGNOSIS — Z1331 Encounter for screening for depression: Secondary | ICD-10-CM

## 2021-07-11 ENCOUNTER — Encounter: Payer: Self-pay | Admitting: Obstetrics and Gynecology

## 2021-07-16 ENCOUNTER — Encounter: Payer: Self-pay | Admitting: Obstetrics and Gynecology

## 2021-07-16 ENCOUNTER — Other Ambulatory Visit: Payer: Self-pay

## 2021-07-16 ENCOUNTER — Ambulatory Visit (INDEPENDENT_AMBULATORY_CARE_PROVIDER_SITE_OTHER): Payer: Medicaid Other | Admitting: Obstetrics and Gynecology

## 2021-07-16 VITALS — BP 116/73 | HR 72 | Wt 145.3 lb

## 2021-07-16 DIAGNOSIS — N898 Other specified noninflammatory disorders of vagina: Secondary | ICD-10-CM | POA: Diagnosis not present

## 2021-07-16 DIAGNOSIS — R102 Pelvic and perineal pain: Secondary | ICD-10-CM | POA: Diagnosis not present

## 2021-07-16 DIAGNOSIS — Z789 Other specified health status: Secondary | ICD-10-CM | POA: Diagnosis not present

## 2021-07-16 NOTE — Progress Notes (Signed)
Pt presents for follow up from GYN U/S. Vaginal mass confirmed on U/S. Reviewed with pt. Case has been reviewed by UroGyn and Gyn Onc. They have recommended MRI for further evaluation. MRI has been ordered but not scheduled. Importance of MRI to help determine additional treatment reviewed with pt.  PE AF VSS Lungs clear Heart RRR  A/P Vaginal mass   Working on scheduling pelvic MRI. F/U and or referral per these results.   Live interrupter used during today's visit

## 2021-07-16 NOTE — Progress Notes (Signed)
Pt presents for follow up after pelvic u/s for pelvic pain. MRI for pelvic mass did not get scheduled yet

## 2021-08-04 ENCOUNTER — Other Ambulatory Visit: Payer: Self-pay

## 2021-08-04 ENCOUNTER — Encounter (HOSPITAL_COMMUNITY): Payer: Self-pay

## 2021-08-04 ENCOUNTER — Ambulatory Visit (HOSPITAL_COMMUNITY)
Admission: RE | Admit: 2021-08-04 | Discharge: 2021-08-04 | Disposition: A | Discharge status: Home / Self Care | Payer: Medicaid Other | Source: Ambulatory Visit | Attending: Obstetrics and Gynecology | Admitting: Obstetrics and Gynecology

## 2021-08-04 DIAGNOSIS — R1900 Intra-abdominal and pelvic swelling, mass and lump, unspecified site: Secondary | ICD-10-CM

## 2021-08-13 ENCOUNTER — Other Ambulatory Visit: Payer: Self-pay | Admitting: Obstetrics and Gynecology

## 2021-08-13 DIAGNOSIS — N898 Other specified noninflammatory disorders of vagina: Secondary | ICD-10-CM

## 2021-08-21 ENCOUNTER — Other Ambulatory Visit: Payer: Self-pay | Admitting: Obstetrics and Gynecology

## 2021-08-21 DIAGNOSIS — N898 Other specified noninflammatory disorders of vagina: Secondary | ICD-10-CM

## 2021-08-26 ENCOUNTER — Ambulatory Visit (HOSPITAL_COMMUNITY)
Admission: RE | Admit: 2021-08-26 | Discharge: 2021-08-26 | Disposition: A | Discharge status: Home / Self Care | Payer: Medicaid Other | Source: Ambulatory Visit | Attending: Obstetrics and Gynecology | Admitting: Obstetrics and Gynecology

## 2021-08-26 ENCOUNTER — Other Ambulatory Visit: Payer: Self-pay | Admitting: Obstetrics and Gynecology

## 2021-08-26 ENCOUNTER — Other Ambulatory Visit: Payer: Self-pay

## 2021-08-26 DIAGNOSIS — N898 Other specified noninflammatory disorders of vagina: Secondary | ICD-10-CM

## 2021-08-26 MED ORDER — GADOBUTROL 1 MMOL/ML IV SOLN
7.5000 mL | Freq: Once | INTRAVENOUS | Status: AC | PRN
  Administered 2021-08-26: 7.5 mL via INTRAVENOUS

## 2021-08-31 ENCOUNTER — Other Ambulatory Visit: Payer: Self-pay | Admitting: Obstetrics and Gynecology

## 2021-08-31 DIAGNOSIS — N898 Other specified noninflammatory disorders of vagina: Secondary | ICD-10-CM

## 2021-08-31 NOTE — Progress Notes (Signed)
Refer to Urology placed.

## 2021-09-13 ENCOUNTER — Telehealth: Payer: Medicaid Other | Admitting: Nurse Practitioner

## 2021-09-13 DIAGNOSIS — R399 Unspecified symptoms and signs involving the genitourinary system: Secondary | ICD-10-CM

## 2021-09-13 MED ORDER — NITROFURANTOIN MONOHYD MACRO 100 MG PO CAPS: 100.0000 mg | ORAL_CAPSULE | Freq: Two times a day (BID) | ORAL | 0 refills | Status: AC

## 2021-09-13 NOTE — Progress Notes (Signed)
I have spent 5 minutes in review of e-visit questionnaire, review and updating patient chart, medical decision making and response to patient.  ° °Rendell Thivierge W Blong Busk, NP ° °  °

## 2021-09-13 NOTE — Progress Notes (Signed)

## 2021-09-24 ENCOUNTER — Encounter: Payer: Self-pay | Admitting: Obstetrics and Gynecology

## 2021-09-24 ENCOUNTER — Telehealth: Payer: Self-pay | Admitting: *Deleted

## 2021-09-24 NOTE — Telephone Encounter (Signed)
Patient called the office and and scheduled a new patient appt for 2/27 at 9 am with Dr Berline Lopes. Patient given the address and phone number for the clinic

## 2021-09-24 NOTE — Telephone Encounter (Signed)
Attempted to reach the patient using [place] interpreter (# [phone]) call attempted twice with no answer and voicemail not set up.   Called Dr Matilde Sprang office and spoke with Bethena Roys at [phone] ext [phone], gave appt date/time op 2/27 at 9 am. Also explained that our office tried to reach her and will try again later today. Bethena Roys will also try to reach the patient.   My chart message sent to the patient to call the office to schedule an appt.

## 2021-10-02 ENCOUNTER — Telehealth: Payer: Self-pay

## 2021-10-02 NOTE — Telephone Encounter (Signed)
Margarite Gouge that Ms Andrea Barry is seeing Dr. Berline Lopes on 10-05-21. Selita requested that Dr. Berline Lopes or her Nurse Practitioner call her after visit [phone]) to schedule joint case with Dr. Tresa Moore.  He wants to do a biopsy.  If Dr. Berline Lopes is not going to do surgery, Dr. Tresa Moore will schedule surgery to do biopsy.

## 2021-10-04 NOTE — Progress Notes (Signed)
GYNECOLOGIC ONCOLOGY NEW PATIENT CONSULTATION   Patient Name: Andrea Barry  Patient Age: 36 y.o. Date of Service: 10/05/21 Referring Provider: Bjorn Loser, [address] [place],  [place] [address]   Primary Care Provider: Pcp, No Consulting Provider: Jeral Pinch, MD   Assessment/Plan:  Premenopausal female with periurethral mass, history of low-grade cervical dysplasia, and long history of pelvic pain.  I reviewed MRI images with the patient as well as my exam findings. We discussed gynecologic causes of such a mass - without a visible vaginal lesion, I think its very unlikely this represents vaginal cancer. Would be an unusual presentation of either endometriosis or fibroid. The mass seems contiguous with the urethra, and my suspicion is that this process is related to her urinary tract.   Given history of cervical dysplasia with cervical biopsies in 08/2020, I recommended cotesting. This was performed today and I will call her with the results.   A copy of this note was sent to the patient's referring provider.   55 minutes of total time was spent for this patient encounter, including preparation, face-to-face counseling with the patient and coordination of care, and documentation of the encounter.   Jeral Pinch, MD  Division of Gynecologic Oncology  Department of Obstetrics and Gynecology  [facility]  ___________________________________________  Chief Complaint: No chief complaint on file.   History of Present Illness:  Andrea Barry is a 36 y.o. y.o. female who is seen in consultation at the request of Bjorn Loser, MD for an evaluation of a periurethral mass.  The patient was initially seen in mid-November with pelvic pain and a vaginal bulge (felt when she wipes). Pelvic ultrasound on 11/22 showed 3.6 solid-appearing vascular mass within the vagina. Pelvic MRI on 1/18 showed a 2.3 x 2.2 x 2.2 cm  well-defined, round mass centered in the left urethral wall that appears contiguous with the muscularis layer. The mass does not follow fluid signal and enhances, excluding diverticulum.   The patient was then referred to Urology and saw Dr. Matilde Sprang and then Dr. Tresa Moore.  Prior to her visit with urology, she was having a number of urinary symptoms and was diagnosed with a urinary tract infection.  She was having some dysuria and frequency, both of which were noted to be improved at her follow-up visit.  Plan is for EUA, cystoscopy and biopsy.   Today, the patient reports that she noticed a "bulge" in her vagina at the beginning of her last pregnancy.  There was growth of this area during the pregnancy.  After her delivery, which was 14 months ago, she continued to feel that the area grew.  She can now see it.  She denies any pain when she urinates but sometimes feels a "warm or hot" sensation.  She was recently treated for urinary tract infection and reports resolution of the symptoms she was having.  If she is sitting, she can sometimes feel the "bulge" when she moves.  She denies any pain when she palpates the "bulge".  She notes some pain with intercourse that will last for 3 or 4 days subsequently.  She denies any vaginal discharge.  She has some stress urinary incontinence, noting that she will have leakage of urine immediately after she voids if she picks up her child.  She also has urinary incontinence after coughing and laughing.  Patient reports decreased appetite for the last 4 weeks.  She denies much urged to eat.  She has  lost about 8 pounds in the month.  She notes some nausea, although she thinks this is related to not eating much.  She endorses normal bowel function.  She describes normal menses that occur every 24 days and last 4 to 5 days.  Prior to her last pregnancy, she was having menstrual cycles every 15 days.  Over the last 14 months since delivery, she denies any change to her  menses and denies any intermenstrual bleeding.  She has a long history of deep pelvic pain.  She was told by a doctor in [place] that she had pelvic "inflammation".  She would never receive treatment.  She can often elicit this pain by deep palpation in her pelvis.  She also notes some bilateral low back pain.  In terms of birth control, she has previously used a Nexplanon.  Findings at the time from her laparoscopic bilateral salpingectomy in 10/2020 were normal pelvic structures.  Patient lives with her children as well as her children's father.  She is currently separated from him.  PAST MEDICAL HISTORY:  Past Medical History:  Diagnosis Date   Allergy    Anemia    Cervical incompetence    2nd trimester fetal loss   Cervical incompetence in pregnancy, second trimester 10/13/2017   21 week loss 07/2016   Depression    Hypothyroidism    Last TSH (04/2017) was normal, off levothyroxine for months   Preterm labor      PAST SURGICAL HISTORY:  Past Surgical History:  Procedure Laterality Date   CERVICAL CERCLAGE N/A 12/15/2017   Procedure: CERCLAGE CERVICAL;  Surgeon: Osborne Oman, MD;  Location: [facility];  Service: Gynecology;  Laterality: N/A;   CERVICAL CERCLAGE N/A 01/22/2020   Procedure: CERCLAGE CERVICAL;  Surgeon: Woodroe Mode, MD;  Location: [facility];  Service: Gynecology;  Laterality: N/A;   LAPAROSCOPIC TUBAL LIGATION N/A 10/22/2020   Procedure: LAPAROSCOPIC TUBAL LIGATION;  Surgeon: Griffin Basil, MD;  Location: [facility];  Service: Gynecology;  Laterality: N/A;   NO PAST SURGERIES      OB/GYN HISTORY:  OB History  Gravida Para Term Preterm AB Living  6 3 2 1 2 2   SAB IAB Ectopic Multiple Live Births  2 0 0 0 2    # Outcome Date GA Lbr Len/2nd Weight Sex Delivery Anes PTL Lv  6 Gravida           5 Term 07/20/20 [redacted]w[redacted]d  7 lb 4.6 oz (3.306 kg) M Vag-Spont EPI  LIV  4 Term 04/20/18 [redacted]w[redacted]d 08:18 / 00:14 7 lb (3.175 kg) F Vag-Spont EPI  LIV     Birth  Comments: short cervix, had cerclage, PTL  3 Preterm 2017 [redacted]w[redacted]d       FD  2 SAB 2015          1 SAB 2014            No LMP recorded.  Age at menarche: 80 Hx of STDs: yes, CT in 2020 Last pap: 01/2020 - ASCUS, HR HPV +; 04/2017 - negative Colposcopy was performed on 09/04/20 - two cervical biopsies showed LSIL, ECC benign. History of abnormal pap smears: yes, see above  SCREENING STUDIES:  Last mammogram: n/a  Last colonoscopy: n/a Last bone mineral density: n/a  MEDICATIONS: Outpatient Encounter Medications as of 10/05/2021  Medication Sig   sertraline (ZOLOFT) 100 MG tablet TAKE 1/2 TABLET(50 MG) BY MOUTH DAILY   No facility-administered encounter medications on file as of  10/05/2021.    ALLERGIES:  Allergies  Allergen Reactions   Metronidazole Other (See Comments)    Dizziness, taste of iron in mouth, and darkened urine     FAMILY HISTORY:  Family History  Problem Relation Age of Onset   Diabetes Mother    Hypertension Mother    Heart disease Mother    Endometrial cancer Mother    Hyperlipidemia Father    COPD Father    Colon cancer Maternal Aunt    Mental illness Maternal Grandmother    Pancreatic cancer Neg Hx    Ovarian cancer Neg Hx    Breast cancer Neg Hx    Prostate cancer Neg Hx      SOCIAL HISTORY:  Social Connections: Not on file    REVIEW OF SYSTEMS:  Pertinent positives include decreased appetite, abdominal pain, dyspareunia, pelvic pain, back pain, headache, easy bruising/bleeding, anxiety, depression, decreased concentration. Denies fevers, chills, fatigue. Denies hearing loss, neck lumps or masses, mouth sores, ringing in ears or voice changes. Denies cough or wheezing.  Denies shortness of breath. Denies chest pain or palpitations. Denies leg swelling. Denies abdominal distention, blood in stools, constipation, diarrhea, nausea, vomiting, or early satiety. Denies dysuria, frequency, hematuria or incontinence. Denies hot flashes, vaginal  bleeding or vaginal discharge.   Denies joint pain or muscle pain/cramps. Denies itching, rash, or wounds. Denies dizziness, numbness or seizures. Denies swollen lymph nodes or glands.  Physical Exam:  Vital Signs for this encounter:  Blood pressure 116/87, pulse 84, temperature 98.7 F (37.1 C), temperature source Oral, resp. rate 14, height 5' 3.58" (1.615 m), weight 140 lb 12.8 oz (63.9 kg), SpO2 97 %, unknown if currently breastfeeding. Body mass index is 24.49 kg/m. General: Alert, oriented, no acute distress.  HEENT: Normocephalic, atraumatic. Sclera anicteric.  Chest: Clear to auscultation bilaterally. No wheezes, rhonchi, or rales. Cardiovascular: Regular rate and rhythm, no murmurs, rubs, or gallops.  Abdomen: Normoactive bowel sounds. Soft, nondistended, nontender to palpation. No masses or hepatosplenomegaly appreciated. No palpable fluid wave.  Well-healed laparoscopic incisions. Extremities: Grossly normal range of motion. Warm, well perfused. No edema bilaterally.  Skin: No rashes or lesions.  Lymphatics: No cervical, supraclavicular, or inguinal adenopathy.  GU:  Normal external female genitalia. No lesions. No discharge or bleeding.             Bladder/urethra: Along the anterior vagina, just to the left of midline, there is a 2-3 cm somewhat mobile circular mass, nontender, smooth.  There is no direct invasion into the vagina although the mass exerts mass-effect on the anterior vagina.             Vagina: Well rugated, no bleeding or discharge.  No visible lesions.             Cervix: Normal appearing, no lesions.  Pap test and HPV testing collected.             Uterus: Small, mobile, no parametrial involvement or nodularity.             Adnexa: No masses appreciated.  Rectal: Deferred.  LABORATORY AND RADIOLOGIC DATA:  Outside medical records were reviewed to synthesize the above history, along with the history and physical obtained during the visit.  Lab Results   Component Value Date   WBC 4.7 10/20/2020   HGB 12.7 10/20/2020   HCT 39.7 10/20/2020   PLT 265 10/20/2020   GLUCOSE 84 04/13/2017   CHOL 146 04/13/2017   TRIG 48 04/13/2017   HDL 64  04/13/2017   LDLCALC 72 04/13/2017   ALT 10 07/07/2020   AST 19 07/07/2020   NA 138 04/13/2017   K 4.7 04/13/2017   CL 104 04/13/2017   CREATININE 0.63 04/13/2017   BUN 7 04/13/2017   CO2 24 04/13/2017   TSH 3.150 01/14/2020   HGBA1C 5.4 01/14/2020

## 2021-10-05 ENCOUNTER — Other Ambulatory Visit: Payer: Self-pay

## 2021-10-05 ENCOUNTER — Other Ambulatory Visit (HOSPITAL_COMMUNITY)
Admission: RE | Admit: 2021-10-05 | Discharge: 2021-10-05 | Disposition: A | Discharge status: Home / Self Care | Payer: Medicaid Other | Source: Ambulatory Visit | Attending: Gynecologic Oncology | Admitting: Gynecologic Oncology

## 2021-10-05 ENCOUNTER — Encounter: Payer: Self-pay | Admitting: Gynecologic Oncology

## 2021-10-05 ENCOUNTER — Telehealth: Payer: Self-pay | Admitting: Gynecologic Oncology

## 2021-10-05 ENCOUNTER — Inpatient Hospital Stay: Payer: Medicaid Other | Attending: Gynecologic Oncology | Admitting: Gynecologic Oncology

## 2021-10-05 VITALS — BP 116/87 | HR 84 | Temp 98.7°F | Resp 14 | Ht 63.58 in | Wt 140.8 lb

## 2021-10-05 DIAGNOSIS — N393 Stress incontinence (female) (male): Secondary | ICD-10-CM | POA: Insufficient documentation

## 2021-10-05 DIAGNOSIS — M549 Dorsalgia, unspecified: Secondary | ICD-10-CM | POA: Diagnosis not present

## 2021-10-05 DIAGNOSIS — F419 Anxiety disorder, unspecified: Secondary | ICD-10-CM | POA: Insufficient documentation

## 2021-10-05 DIAGNOSIS — Z87410 Personal history of cervical dysplasia: Secondary | ICD-10-CM | POA: Insufficient documentation

## 2021-10-05 DIAGNOSIS — F32A Depression, unspecified: Secondary | ICD-10-CM | POA: Diagnosis not present

## 2021-10-05 DIAGNOSIS — Z124 Encounter for screening for malignant neoplasm of cervix: Secondary | ICD-10-CM

## 2021-10-05 DIAGNOSIS — D487 Neoplasm of uncertain behavior of other specified sites: Secondary | ICD-10-CM | POA: Diagnosis not present

## 2021-10-05 DIAGNOSIS — R102 Pelvic and perineal pain: Secondary | ICD-10-CM | POA: Diagnosis not present

## 2021-10-05 DIAGNOSIS — Z79899 Other long term (current) drug therapy: Secondary | ICD-10-CM | POA: Insufficient documentation

## 2021-10-05 DIAGNOSIS — N941 Unspecified dyspareunia: Secondary | ICD-10-CM | POA: Insufficient documentation

## 2021-10-05 DIAGNOSIS — N368 Other specified disorders of urethra: Secondary | ICD-10-CM | POA: Diagnosis present

## 2021-10-05 DIAGNOSIS — E039 Hypothyroidism, unspecified: Secondary | ICD-10-CM | POA: Insufficient documentation

## 2021-10-05 DIAGNOSIS — Z9079 Acquired absence of other genital organ(s): Secondary | ICD-10-CM | POA: Insufficient documentation

## 2021-10-05 NOTE — Patient Instructions (Signed)
Guesto encontrarle hoy.  Llamamos consu resultados de pap. Supestamente, toma una semana para lost resultados.  Tabajamos con la oficina de Dr. Tresa Moore para una fecha por la [place].  Mi parte, si hago algo, seria examen y biopsy de la bolla de al parte de su vagina. No puedo ver la bolla en la vagina (solo puedo sentirla).

## 2021-10-05 NOTE — Telephone Encounter (Signed)
[facility] to discuss joint procedure for this patient.  Message left on the surgery scheduler voicemail.

## 2021-10-12 ENCOUNTER — Telehealth: Payer: Self-pay | Admitting: Gynecologic Oncology

## 2021-10-12 ENCOUNTER — Other Ambulatory Visit: Payer: Self-pay | Admitting: Urology

## 2021-10-12 LAB — CYTOLOGY - PAP
Comment: NEGATIVE
Diagnosis: NEGATIVE
High risk HPV: NEGATIVE

## 2021-10-12 NOTE — Telephone Encounter (Signed)
[facility] to discuss joint procedure for this patient.  Message left on the surgery scheduler voicemail. ?

## 2021-10-23 ENCOUNTER — Encounter (HOSPITAL_BASED_OUTPATIENT_CLINIC_OR_DEPARTMENT_OTHER): Payer: Self-pay | Admitting: Urology

## 2021-10-26 ENCOUNTER — Encounter (HOSPITAL_BASED_OUTPATIENT_CLINIC_OR_DEPARTMENT_OTHER): Payer: Self-pay | Admitting: Urology

## 2021-10-26 ENCOUNTER — Other Ambulatory Visit: Payer: Self-pay

## 2021-10-26 NOTE — Progress Notes (Signed)
Spoke w/ via phone for pre-op interview: patient ?Lab needs dos: UPT ?Lab results: NA ?COVID test: patient states asymptomatic no test needed. ?Arrive at 09:45 10/28/21 ?NPO after MN except clear liquids.Clear liquids from MN until 08:45 ?Med rec completed. ?Medications to take morning of surgery: Zoloft only ?Diabetic medication: NA ?Patient instructed no nail polish to be worn day of surgery. ?Patient instructed to bring photo id and insurance card day of surgery. ?Patient aware to have Driver (ride ) / caregiver for 24 hours after surgery. (Luel, friend to drive) ?Patient Special Instructions: NA ?Pre-Op special Istructions: Spanish interpreter requested. ?Patient verbalized understanding of instructions that were given at this phone interview. ?Patient denies shortness of breath, chest pain, fever, cough at this phone interview.  ?

## 2021-10-27 ENCOUNTER — Telehealth: Payer: Self-pay

## 2021-10-27 NOTE — Telephone Encounter (Signed)
Attempted to contact patient to check in with her pre-operatively. Unable to contact patient and unable to leave voicemail.  ?

## 2021-10-27 NOTE — Telephone Encounter (Signed)
Telephone call to check on pre-operative status.  Patient compliant with pre-operative instructions.  Reinforced nothing to eat after midnight. Clear liquids until 0845 am. Patient to arrive at 0945 am.  No questions or concerns voiced.  Instructed to call for any needs.  ?

## 2021-10-28 ENCOUNTER — Ambulatory Visit (HOSPITAL_BASED_OUTPATIENT_CLINIC_OR_DEPARTMENT_OTHER): Payer: Medicaid Other | Admitting: Anesthesiology

## 2021-10-28 ENCOUNTER — Ambulatory Visit (HOSPITAL_BASED_OUTPATIENT_CLINIC_OR_DEPARTMENT_OTHER)
Admission: RE | Admit: 2021-10-28 | Discharge: 2021-10-28 | Disposition: A | Discharge status: Home / Self Care | Payer: Medicaid Other | Attending: Urology | Admitting: Urology

## 2021-10-28 ENCOUNTER — Encounter (HOSPITAL_BASED_OUTPATIENT_CLINIC_OR_DEPARTMENT_OTHER)
Admission: RE | Disposition: A | Discharge status: Home / Self Care | Payer: Self-pay | Source: Home / Self Care | Attending: Urology

## 2021-10-28 ENCOUNTER — Encounter (HOSPITAL_BASED_OUTPATIENT_CLINIC_OR_DEPARTMENT_OTHER): Payer: Self-pay | Admitting: Urology

## 2021-10-28 ENCOUNTER — Other Ambulatory Visit: Payer: Self-pay

## 2021-10-28 DIAGNOSIS — Z6824 Body mass index (BMI) 24.0-24.9, adult: Secondary | ICD-10-CM | POA: Diagnosis not present

## 2021-10-28 DIAGNOSIS — Z8759 Personal history of other complications of pregnancy, childbirth and the puerperium: Secondary | ICD-10-CM | POA: Insufficient documentation

## 2021-10-28 DIAGNOSIS — F32A Depression, unspecified: Secondary | ICD-10-CM | POA: Diagnosis not present

## 2021-10-28 DIAGNOSIS — N393 Stress incontinence (female) (male): Secondary | ICD-10-CM | POA: Diagnosis not present

## 2021-10-28 DIAGNOSIS — D759 Disease of blood and blood-forming organs, unspecified: Secondary | ICD-10-CM | POA: Insufficient documentation

## 2021-10-28 DIAGNOSIS — N898 Other specified noninflammatory disorders of vagina: Secondary | ICD-10-CM

## 2021-10-28 DIAGNOSIS — Z87440 Personal history of urinary (tract) infections: Secondary | ICD-10-CM | POA: Insufficient documentation

## 2021-10-28 DIAGNOSIS — R102 Pelvic and perineal pain: Secondary | ICD-10-CM | POA: Insufficient documentation

## 2021-10-28 DIAGNOSIS — D649 Anemia, unspecified: Secondary | ICD-10-CM | POA: Diagnosis not present

## 2021-10-28 DIAGNOSIS — E669 Obesity, unspecified: Secondary | ICD-10-CM | POA: Diagnosis not present

## 2021-10-28 DIAGNOSIS — N368 Other specified disorders of urethra: Secondary | ICD-10-CM

## 2021-10-28 DIAGNOSIS — Z9079 Acquired absence of other genital organ(s): Secondary | ICD-10-CM | POA: Insufficient documentation

## 2021-10-28 DIAGNOSIS — D259 Leiomyoma of uterus, unspecified: Secondary | ICD-10-CM | POA: Insufficient documentation

## 2021-10-28 DIAGNOSIS — F419 Anxiety disorder, unspecified: Secondary | ICD-10-CM | POA: Diagnosis not present

## 2021-10-28 HISTORY — DX: Personal history of other diseases of the female genital tract: Z87.42

## 2021-10-28 HISTORY — DX: Other chronic pain: G89.29

## 2021-10-28 HISTORY — DX: Mixed incontinence: N39.46

## 2021-10-28 HISTORY — DX: Urgency of urination: R39.15

## 2021-10-28 HISTORY — DX: Major depressive disorder, single episode, unspecified: F32.9

## 2021-10-28 HISTORY — DX: Frequency of micturition: R35.0

## 2021-10-28 HISTORY — DX: Personal history of other endocrine, nutritional and metabolic disease: Z86.39

## 2021-10-28 HISTORY — PX: CYSTOSCOPY W/ RETROGRADES: SHX1426

## 2021-10-28 HISTORY — DX: Other specified disorders of urethra: N36.8

## 2021-10-28 HISTORY — DX: Personal history of cervical dysplasia: Z87.410

## 2021-10-28 HISTORY — DX: Dysuria: R30.0

## 2021-10-28 LAB — POCT I-STAT, CHEM 8
BUN: 11 mg/dL (ref 6–20)
Calcium, Ion: 1.18 mmol/L (ref 1.15–1.40)
Chloride: 104 mmol/L (ref 98–111)
Creatinine, Ser: 0.4 mg/dL — ABNORMAL LOW (ref 0.44–1.00)
Glucose, Bld: 94 mg/dL (ref 70–99)
HCT: 37 % (ref 36.0–46.0)
Hemoglobin: 12.6 g/dL (ref 12.0–15.0)
Potassium: 3.8 mmol/L (ref 3.5–5.1)
Sodium: 141 mmol/L (ref 135–145)
TCO2: 26 mmol/L (ref 22–32)

## 2021-10-28 LAB — POCT PREGNANCY, URINE: Preg Test, Ur: NEGATIVE

## 2021-10-28 SURGERY — CYSTOSCOPY, WITH RETROGRADE PYELOGRAM
Anesthesia: General

## 2021-10-28 MED ORDER — ONDANSETRON HCL 4 MG/2ML IJ SOLN
INTRAMUSCULAR | Status: AC
Start: 2021-10-28 — End: ?
  Filled 2021-10-28: qty 2

## 2021-10-28 MED ORDER — LIDOCAINE HCL (CARDIAC) PF 100 MG/5ML IV SOSY
PREFILLED_SYRINGE | INTRAVENOUS | Status: DC | PRN
  Administered 2021-10-28: 50 mg via INTRAVENOUS

## 2021-10-28 MED ORDER — KETOROLAC TROMETHAMINE 30 MG/ML IJ SOLN
INTRAMUSCULAR | Status: DC | PRN
  Administered 2021-10-28: 30 mg via INTRAVENOUS

## 2021-10-28 MED ORDER — FENTANYL CITRATE (PF) 100 MCG/2ML IJ SOLN
INTRAMUSCULAR | Status: DC | PRN
  Administered 2021-10-28: 100 ug via INTRAVENOUS

## 2021-10-28 MED ORDER — PROPOFOL 10 MG/ML IV BOLUS
INTRAVENOUS | Status: AC
Start: 2021-10-28 — End: ?
  Filled 2021-10-28: qty 20

## 2021-10-28 MED ORDER — ACETAMINOPHEN 10 MG/ML IV SOLN: 1000.0000 mg | Freq: Once | INTRAVENOUS | Status: DC | PRN

## 2021-10-28 MED ORDER — MIDAZOLAM HCL 5 MG/5ML IJ SOLN
INTRAMUSCULAR | Status: DC | PRN
  Administered 2021-10-28: 2 mg via INTRAVENOUS

## 2021-10-28 MED ORDER — OXYCODONE-ACETAMINOPHEN 5-325 MG PO TABS: 1.0000 | ORAL_TABLET | Freq: Four times a day (QID) | ORAL | 0 refills | Status: DC | PRN

## 2021-10-28 MED ORDER — STERILE WATER FOR IRRIGATION IR SOLN
Status: DC | PRN
  Administered 2021-10-28: 3000 mL

## 2021-10-28 MED ORDER — DEXAMETHASONE SODIUM PHOSPHATE 10 MG/ML IJ SOLN
INTRAMUSCULAR | Status: DC | PRN
Start: 2021-10-28 — End: 2021-10-28
  Administered 2021-10-28: 10 mg via INTRAVENOUS

## 2021-10-28 MED ORDER — IOHEXOL 300 MG/ML  SOLN
INTRAMUSCULAR | Status: DC | PRN
  Administered 2021-10-28: 10 mL via URETHRAL

## 2021-10-28 MED ORDER — KETOROLAC TROMETHAMINE 30 MG/ML IJ SOLN
INTRAMUSCULAR | Status: AC
  Filled 2021-10-28: qty 1

## 2021-10-28 MED ORDER — LIDOCAINE HCL (PF) 2 % IJ SOLN
INTRAMUSCULAR | Status: AC
  Filled 2021-10-28: qty 5

## 2021-10-28 MED ORDER — DEXAMETHASONE SODIUM PHOSPHATE 10 MG/ML IJ SOLN
INTRAMUSCULAR | Status: AC
  Filled 2021-10-28: qty 1

## 2021-10-28 MED ORDER — LACTATED RINGERS IV SOLN: INTRAVENOUS | Status: DC

## 2021-10-28 MED ORDER — SODIUM CHLORIDE 0.9 % IR SOLN: Status: DC | PRN

## 2021-10-28 MED ORDER — GENTAMICIN SULFATE 40 MG/ML IJ SOLN
5.0000 mg/kg | INTRAMUSCULAR | Status: AC
  Administered 2021-10-28: 320 mg via INTRAVENOUS
  Filled 2021-10-28: qty 8

## 2021-10-28 MED ORDER — PROPOFOL 10 MG/ML IV BOLUS
INTRAVENOUS | Status: DC | PRN
Start: 2021-10-28 — End: 2021-10-28
  Administered 2021-10-28: 150 mg via INTRAVENOUS

## 2021-10-28 MED ORDER — FENTANYL CITRATE (PF) 100 MCG/2ML IJ SOLN: 25.0000 ug | INTRAMUSCULAR | Status: DC | PRN

## 2021-10-28 MED ORDER — FENTANYL CITRATE (PF) 100 MCG/2ML IJ SOLN
INTRAMUSCULAR | Status: AC
  Filled 2021-10-28: qty 2

## 2021-10-28 MED ORDER — ONDANSETRON HCL 4 MG/2ML IJ SOLN
INTRAMUSCULAR | Status: DC | PRN
  Administered 2021-10-28: 4 mg via INTRAVENOUS

## 2021-10-28 MED ORDER — MIDAZOLAM HCL 2 MG/2ML IJ SOLN
INTRAMUSCULAR | Status: AC
  Filled 2021-10-28: qty 2

## 2021-10-28 SURGICAL SUPPLY — 19 items
BAG DRAIN URO-CYSTO SKYTR STRL (DRAIN) ×3 IMPLANT
CATH INTERMIT  6FR 70CM (CATHETERS) ×3 IMPLANT
CLOTH BEACON ORANGE TIMEOUT ST (SAFETY) ×3 IMPLANT
DRSG TELFA 3X8 NADH (GAUZE/BANDAGES/DRESSINGS) ×3 IMPLANT
GLOVE SURG ENC MOIS LTX SZ6 (GLOVE) ×5 IMPLANT
GLOVE SURG ENC MOIS LTX SZ7.5 (GLOVE) ×3 IMPLANT
GLOVE SURG POLYISO LF SZ7 (GLOVE) ×1 IMPLANT
GLOVE SURG UNDER POLY LF SZ7 (GLOVE) ×1 IMPLANT
GOWN STRL REUS W/TWL LRG LVL3 (GOWN DISPOSABLE) ×3 IMPLANT
GOWN STRL REUS W/TWL XL LVL3 (GOWN DISPOSABLE) ×1 IMPLANT
GUIDEWIRE STR DUAL SENSOR (WIRE) ×1 IMPLANT
INSTR BIOPSY MAXCORE 18GX20 (NEEDLE) ×1 IMPLANT
KIT TURNOVER CYSTO (KITS) ×5 IMPLANT
MANIFOLD NEPTUNE II (INSTRUMENTS) ×3 IMPLANT
PACK CYSTO (CUSTOM PROCEDURE TRAY) ×3 IMPLANT
PACK VAGINAL WOMENS (CUSTOM PROCEDURE TRAY) ×3 IMPLANT
PAD DRESSING TELFA 3X8 NADH (GAUZE/BANDAGES/DRESSINGS) IMPLANT
SYR BULB IRRIG 60ML STRL (SYRINGE) ×3 IMPLANT
WATER STERILE IRR 3000ML UROMA (IV SOLUTION) ×1 IMPLANT

## 2021-10-28 NOTE — Op Note (Signed)
OPERATIVE NOTE ? ?PATIENT: Andrea Barry ? ?ENCOUNTER DATE: 10/28/21 ? ?Preop Diagnosis: Peri-urethral mass ? ?Postoperative Diagnosis: same as above ? ?Surgery: EUA ? ?Surgeons: Valarie Cones MD ?Assistant: none ? ?Anesthesia: General  ? ?Estimated blood loss: n/a for my portion ? ?IVF:  see Dr. Zettie Pho op note  ? ?Urine output: n/a for my portion  ? ?Complications: None apparent ? ?Pathology: none for my portion ? ?Operative findings: On vaginal exam, 3 x 2 cm smooth and mobile mass appreciated along distal half of the anterior vagina. On Dr. Zettie Pho evaluation, no visible masses or abnormalities noted within the bladder or urethra. Mass does not feel attached to the urethra but in close proximity (palpated with cystoscope in the urethra).  ? ?Procedure: The patient was identified in the preoperative holding area. Informed consent was signed on the chart. Patient was seen history was reviewed and exam was performed.  ? ?The patient was then taken to the operating room. Please see Dr. Zettie Pho op. ? ?After Dr. Tresa Moore performed cystoscopy and mass biopsies, I performed an EUA with findings noted above. The case was then turned back over to Dr. Tresa Moore.  ? ?Lafonda Mosses, MD ? ?

## 2021-10-28 NOTE — Anesthesia Procedure Notes (Signed)
Procedure Name: LMA Insertion ?Date/Time: 10/28/2021 12:16 PM ?Performed by: Jonna Munro, CRNA ?Pre-anesthesia Checklist: Patient identified, Emergency Drugs available, Suction available, Patient being monitored and Timeout performed ?Patient Re-evaluated:Patient Re-evaluated prior to induction ?Oxygen Delivery Method: Circle system utilized ?Preoxygenation: Pre-oxygenation with 100% oxygen ?Induction Type: IV induction ?LMA: LMA inserted ?LMA Size: 4.0 ?Number of attempts: 1 ?Placement Confirmation: positive ETCO2 and breath sounds checked- equal and bilateral ?Tube secured with: Tape ?Dental Injury: Teeth and Oropharynx as per pre-operative assessment  ? ? ? ? ?

## 2021-10-28 NOTE — Op Note (Signed)
NAME: Andrea Barry, Andrea Barry ?MEDICAL RECORD NO: [id number] ?ACCOUNT NO: [id number] ?DATE OF BIRTH: [birth year] ?FACILITY: [facility] ?LOCATION: [facility] ?PHYSICIAN: Alexis Frock, MD ? ?Operative Report  ? ?DATE OF PROCEDURE: 10/28/2021 ? ?PREOPERATIVE DIAGNOSIS:  Periurethral mass. ? ?PROCEDURE PERFORMED:   ?1.  Cystoscopy with bilateral retrograde pyelograms, interpretation. ?2.  Biopsy of periurethral mass. ? ?ESTIMATED BLOOD LOSS:  Nil. ? ?COMPLICATIONS:  None. ? ?SPECIMEN:  Periurethral mass.  Core biopsy for permanent pathology. ? ?FINDINGS:   ?1.  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 ?2.  Unremarkable bladder. ?3.  Unremarkable bilateral pyelograms. ?4.  Nondirect invasion of the bladder or vagina. ? ?INDICATIONS:  The patient is a pleasant 36 year old lady who was found on workup of irritative voiding to have a palpable periurethral mass.  She underwent an MRI, which corroborated a solid-appearing neoplasm versus neoplasm within the urethral  ?diverticulum.  She emptied well with unremarkable UA. Options were discussed for management including recommended path of exam under anesthesia, cystoscopy, retrogrades and biopsy to rule out aggressive neoplasm.  She also was seen by our GYN Oncology  ?colleagues who had the same goals of ruling out aggressive neoplasm and also entertained  differential diagnosis of abnormal fibroma as well as endometrioma.  Informed consent was obtained and placed in medical record. ? ?PROCEDURE IN DETAIL:  The patient being herself verified, procedure being cystoscopy, retrogrades, biopsy was confirmed Procedure timeout was performed.  Intravenous antibiotics were administered.  General anesthesia was induced.  The patient was placed  ?into a medium lithotomy position.  Sterile field was created, prepped and draped the patient's vagina, introitus, and proximal thigh using iodine.  Cystourethroscopy was then performed using a 21-French  rigid cystoscope with offset lens.  Very careful  ?inspection of the urethra circumferentially revealed no evidence of neoplasm within the lumen of the urethra.  There were also no ostia consistent with overt diverticula.  Inspection of urinary bladder was also unremarkable.  No papillary neoplasms or  ?changes in the bladder neck were noted whatsoever.  Ureteral orifices appeared single.  The right ureteral orifice was cannulated with a 6-French renal catheter and right retrograde pyelogram was obtained. ? ?Right retrograde pyelogram demonstrated single right ureter, single system right kidney.  No filling defects or narrowing noted.  Similarly, left retrograde pyelogram was obtained. ? ?Left retrograde pyelogram demonstrated single left ureter, single system left kidney.  No filling defects or narrowing noted.  Having ruled out direct GU involvement from the neoplasm, additional exam under anesthesia was performed and the mass was again ? solid feeling, relatively mobile without any obvious direct invasion of the vaginal mucosa either.  This was quite favorable.  Next, using a core biopsy gun, 3 representative cores of tissue were taken, 2 from a periurethral approach and 1 from a  ?vaginal approach directly into the solid area of mass. Adequate appearing core specimens were obtained.  These were set aside for permanent pathology.  Pressure was applied for approximately 90 seconds.  Hemostasis was excellent.  We achieved the goals  ?of the procedure today. It was terminated.  The patient tolerated the procedure well.  No immediate periprocedural complications.  The patient was taken to [facility] in stable condition with plan to discharge home. ? ? ? ? ?PAA ?D: 10/28/2021 2:04:59 pm T: 10/28/2021 10:14:00 pm  ?JOB: [id number]/ [id number]  ?

## 2021-10-28 NOTE — H&P (Signed)
Andrea Barry is an 36 y.o. female.   ? ?Chief Complaint: Pre-OP Cystoscopy / Retrogrades / Biopsy Urethral Mass ? ?HPI:  ? 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 PVR 2023 "0" mL. ? mass indiverticulum v. primary GYN v. Urol mass. ? ? ???PMH sig for mild obesity, BTL, Cerclage with pregnancy. Has 1 and 36yo. She is originally from [place], speaks very good [place]. ? ? ??Today " [place] " is seen to proceed with operative cysto, retrogrades, exam under anesthesia, biopsy to further characterize urethral mass in conjunciton with D.r Berline Lopes with GYN oncology.. No interva fevers. Most recent UA without infectious parameters.  ? ? ?Past Medical History:  ?Diagnosis Date  ? Anemia   ? Chronic pelvic pain in female   ? Dysuria   ? Frequency of urination   ? History of cervical dysplasia   ? History of cervical incompetence   ? w/ hx fetal demise in 2017 gestation 22wks  ? History of hypothyroidism   ? Last TSH (04/2017) was normal, off levothyroxine for months  ? Mass of urethra   ? w/ vaginal mass  ? MDD (major depressive disorder)   ? Mixed urge and stress incontinence   ? Urgency of urination   ? ? ?Past Surgical History:  ?Procedure Laterality Date  ? CERVICAL CERCLAGE N/A 12/15/2017  ? Procedure: CERCLAGE CERVICAL;  Surgeon: Osborne Oman, MD;  Location: [facility];  Service: Gynecology;  Laterality: N/A;  ? CERVICAL CERCLAGE N/A 01/22/2020  ? Procedure: CERCLAGE CERVICAL;  Surgeon: Woodroe Mode, MD;  Location: [facility];  Service: Gynecology;  Laterality: N/A;  ? LAPAROSCOPIC TUBAL LIGATION N/A 10/22/2020  ? Procedure: LAPAROSCOPIC TUBAL LIGATION;  Surgeon: Griffin Basil, MD;  Location: [facility];  Service: Gynecology;  Laterality: N/A;  ? ? ?Family History  ?Problem Relation Age of Onset  ? Diabetes Mother   ? Hypertension Mother   ? Heart disease Mother   ? Endometrial cancer Mother   ? Hyperlipidemia Father   ? COPD  Father   ? Colon cancer Maternal Aunt   ? Mental illness Maternal Grandmother   ? Pancreatic cancer Neg Hx   ? Ovarian cancer Neg Hx   ? Breast cancer Neg Hx   ? Prostate cancer Neg Hx   ? ?Social History:  reports that she has never smoked. She has never used smokeless tobacco. She reports that she does not drink alcohol and does not use drugs. ? ?Allergies:  ?Allergies  ?Allergen Reactions  ? Metronidazole Other (See Comments)  ?  Dizziness, taste of iron in mouth, and darkened urine  ? ? ?No medications prior to admission.  ? ? ?No results found for this or any previous visit (from the past 48 hour(s)). ?No results found. ? ?Review of Systems  ?Genitourinary:  Positive for difficulty urinating and pelvic pain.  ?All other systems reviewed and are negative. ? ?Height 5' 3.58" (1.615 m), weight 64 kg, last menstrual period 10/14/2021, not currently breastfeeding. ?Physical Exam ?Vitals reviewed.  ?HENT:  ?   Head: Normocephalic.  ?Eyes:  ?   Pupils: Pupils are equal, round, and reactive to light.  ?Cardiovascular:  ?   Rate and Rhythm: Normal rate.  ?Abdominal:  ?   Comments: Stable mild truncal obesity  ?Genitourinary: ?   Comments: No CVAT ?Musculoskeletal:     ?   General: Normal range of motion.  ?Skin: ?  General: Skin is warm.  ?Neurological:  ?   General: No focal deficit present.  ?  ? ?Assessment/Plan ? ?Proceed as planned with cysto, biopsy, retrogrades, exam under anesthesia. Risks, benefits, alternaitves, expected peri-op course discussed previously and reiterated today.  ? ?Alexis Frock, MD ?10/28/2021, 5:49 AM ? ? ? ?

## 2021-10-28 NOTE — Anesthesia Postprocedure Evaluation (Signed)
Anesthesia Post Note ? ?Patient: [facility] ? ?Procedure(s) Performed: CYSTOSCOPY WITH RETROGRADE PYELOGRAM AND URETHRAL BIOPSY (Bilateral) ?EXAM UNDER ANESTHESIA ? ?  ? ?Patient location during evaluation: PACU ?Anesthesia Type: General ?Level of consciousness: awake ?Pain management: pain level controlled ?Vital Signs Assessment: post-procedure vital signs reviewed and stable ?Respiratory status: spontaneous breathing and respiratory function stable ?Cardiovascular status: stable ?Postop Assessment: no apparent nausea or vomiting ?Anesthetic complications: no ? ? ?No notable events documented. ? ?Last Vitals:  ?Vitals:  ? 10/28/21 1315 10/28/21 1330  ?BP: 103/73 103/85  ?Pulse: 60 (!) 58  ?Resp: 12 11  ?Temp:    ?SpO2: 97% 98%  ?  ?Last Pain:  ?Vitals:  ? 10/28/21 1315  ?PainSc: 0-No pain  ? ? ?  ?  ?  ?  ?  ?  ? ?Merlinda Frederick ? ? ? ? ?

## 2021-10-28 NOTE — Brief Op Note (Signed)
10/28/2021 ? ?1:59 PM ? ?PATIENT:  Andrea Barry  36 y.o. female ? ?PRE-OPERATIVE DIAGNOSIS:  URETHRAL / VAGINAL MASS ? ?POST-OPERATIVE DIAGNOSIS:  URETHRAL / VAGINAL MASS ? ?PROCEDURE:  Procedure(s): ?CYSTOSCOPY WITH RETROGRADE PYELOGRAM AND URETHRAL BIOPSY (Bilateral) ?EXAM UNDER ANESTHESIA (N/A) ? ?SURGEON:  Surgeon(s) and Role: ?Panel 1: ?   Alexis Frock, MD - Primary ?Panel 2: ?   Berline Lopes, Corinna Lines, MD - Primary ? ?PHYSICIAN ASSISTANT:  ? ?ASSISTANTS: none  ? ?ANESTHESIA:   general ? ?EBL:  2 mL  ? ?BLOOD ADMINISTERED:none ? ?DRAINS: none  ? ?LOCAL MEDICATIONS USED:  NONE ? ?SPECIMEN:  Source of Specimen:  peri-urethral mass core biopsy ? ?DISPOSITION OF SPECIMEN:  PATHOLOGY ? ?COUNTS:  YES ? ?TOURNIQUET:  * No tourniquets in log * ? ?DICTATION: .Other Dictation: Dictation Number [id number] ? ?PLAN OF CARE: Discharge to home after PACU ? ?PATIENT DISPOSITION:  PACU - hemodynamically stable. ?  ?Delay start of Pharmacological VTE agent (>24hrs) due to surgical blood loss or risk of bleeding: yes ? ?
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
## 2021-10-28 NOTE — Transfer of Care (Signed)
Immediate Anesthesia Transfer of Care Note ? ?Patient: [name] [facility] ? ?Procedure(s) Performed: CYSTOSCOPY WITH RETROGRADE PYELOGRAM AND URETHRAL BIOPSY (Bilateral) ?EXAM UNDER ANESTHESIA ? ?Patient Location: PACU ? ?Anesthesia Type:General ? ?Level of Consciousness: awake, alert , oriented and patient cooperative ? ?Airway & Oxygen Therapy: Patient Spontanous Breathing and Patient connected to face mask oxygen ? ?Post-op Assessment: Report given to RN, Post -op Vital signs reviewed and stable and Patient moving all extremities X 4 ? ?Post vital signs: Reviewed and stable ? ?Last Vitals:  ?Vitals Value Taken Time  ?BP 104/78 10/28/21 1246  ?Temp    ?Pulse 68 10/28/21 1247  ?Resp 12 10/28/21 1247  ?SpO2 98 % 10/28/21 1247  ?Vitals shown include unvalidated device data. ? ?Last Pain: There were no vitals filed for this visit.   ? ?  ? ?Complications: No notable events documented. ?

## 2021-10-28 NOTE — Interval H&P Note (Signed)
History and Physical Interval Note: ? ?10/28/2021 ?10:07 AM ? ?[facility]  has presented today for surgery, with the diagnosis of URETHRAL / VAGINAL MASS.  The various methods of treatment have been discussed with the patient and family. After consideration of risks, benefits and other options for treatment, the patient has consented to  Procedure(s) with comments: ?CYSTOSCOPY WITH RETROGRADE PYELOGRAM AND URETHRAL BIOPSY (Bilateral) - 75 MINS TOTAL ?POSSIBLE EXAM UNDER ANESTHESIA; POSSIBLE PARA URETHRAL MASS BIOPSY (N/A) as a surgical intervention.  The patient's history has been reviewed, patient examined, no change in status, stable for surgery.  I have reviewed the patient's chart and labs.  Questions were answered to the patient's satisfaction.   ? ? ?Lafonda Mosses ? ? ?

## 2021-10-28 NOTE — Anesthesia Preprocedure Evaluation (Addendum)
Anesthesia Evaluation  ?Patient identified by MRN, date of birth, ID band ?Patient awake ? ? ? ?Reviewed: ?Allergy & Precautions, NPO status , Patient's Chart, lab work & pertinent test results ? ?Airway ?Mallampati: II ? ?TM Distance: >3 FB ?Neck ROM: Full ? ? ? Dental ? ?(+) Teeth Intact ?  ?Pulmonary ?neg pulmonary ROS,  ?  ?Pulmonary exam normal ? ? ? ? ? ? ? Cardiovascular ?negative cardio ROS ? ? ?Rhythm:Regular Rate:Normal ? ? ?  ?Neuro/Psych ?Anxiety Depression negative neurological ROS ?   ? GI/Hepatic ?negative GI ROS, Neg liver ROS,   ?Endo/Other  ?Hypothyroidism  ? Renal/GU ?negative Renal ROS  ? ?Urethral/vaginal mass ? ?  ?Musculoskeletal ?negative musculoskeletal ROS ?(+)  ? Abdominal ?Normal abdominal exam  (+)   ?Peds ? Hematology ? ?(+) Blood dyscrasia, anemia ,   ?Anesthesia Other Findings ? ? Reproductive/Obstetrics ? ?  ? ? ? ? ? ? ? ? ? ? ? ? ? ?  ?  ? ? ? ? ? ? ? ?Anesthesia Physical ?Anesthesia Plan ? ?ASA: 2 ? ?Anesthesia Plan: General  ? ?Post-op Pain Management:   ? ?Induction: Intravenous ? ?PONV Risk Score and Plan: 3 and Ondansetron, Dexamethasone, Midazolam and Treatment may vary due to age or medical condition ? ?Airway Management Planned: Mask and LMA ? ?Additional Equipment: None ? ?Intra-op Plan:  ? ?Post-operative Plan: Extubation in OR ? ?Informed Consent: I have reviewed the patients History and Physical, chart, labs and discussed the procedure including the risks, benefits and alternatives for the proposed anesthesia with the patient or authorized representative who has indicated his/her understanding and acceptance.  ? ? ? ?Dental advisory given and Interpreter used for interveiw ? ?Plan Discussed with: CRNA ? ?Anesthesia Plan Comments: (Lab Results ?     Component                Value               Date                 ?     WBC                      4.7                 10/20/2020           ?     HGB                      12.7                 10/20/2020           ?     HCT                      39.7                10/20/2020           ?     MCV                      86.9                10/20/2020           ?     PLT  265                 10/20/2020           ?Lab Results ?     Component                Value               Date                 ?     NA                       138                 04/13/2017           ?     K                        4.7                 04/13/2017           ?     CO2                      24                  04/13/2017           ?     GLUCOSE                  84                  04/13/2017           ?     BUN                      7                   04/13/2017           ?     CREATININE               0.63                04/13/2017           ?     CALCIUM                  9.3                 04/13/2017           ?     GFRNONAA                 121                 04/13/2017          )  ? ? ? ? ? ? ?Anesthesia Quick Evaluation ? ?

## 2021-10-28 NOTE — Discharge Instructions (Addendum)
1 - You may have urinary urgency (bladder spasms) and bloody urine on / off for up to 2 weeks.. This is normal. ? ?2 - Call MD or go to ER for fever >102, severe pain / nausea / vomiting not relieved by medications, or acute change in medical status ? ? ? ? ?   No ibuprofen, Advil, Aleve, Motrin, ketorolac, meloxicam, naproxen, or other NSAIDS until after 6:30 pm today if needed. ? ?

## 2021-10-29 ENCOUNTER — Encounter (HOSPITAL_BASED_OUTPATIENT_CLINIC_OR_DEPARTMENT_OTHER): Payer: Self-pay | Admitting: Urology

## 2021-10-29 ENCOUNTER — Telehealth: Payer: Self-pay

## 2021-10-29 NOTE — Telephone Encounter (Addendum)
Spoke with Ms. [facility] this morning. She states she has been drinking plenty of fluids but has not had much of an appetite. She endorses burning with urination and some abdominal pain that she describes as "little pinches". She has not had a BM yet and is not passing gas. She denies fever or chills. She has some spotting. She rates her pain 3/10. Her pain is controlled with oxycodone. Encouraged patient to have smaller lighter meals today and ensure she is taking her oxycodone with food.  ? ?Per Dr. Zettie Pho discharge instructions abdominal/urethral pain and burning with urination is expected after the procedure and may last up to one week. If she has a fever greater than 102, severe pain, nausea or vomiting or any questions or concerns contact Dr. Zettie Pho office. ? ?Patient reports she is established with social worker Andrea Barry and has her contact information. ? ?Pt aware of post op appointments and to call if she has any questions or concerns  ?

## 2021-10-30 LAB — SURGICAL PATHOLOGY

## 2021-11-10 ENCOUNTER — Encounter: Payer: Self-pay | Admitting: Gynecologic Oncology

## 2021-11-10 NOTE — Progress Notes (Signed)
I called the patient, discussed that pathology had not come to my box, but I saw it today. Biopsy shows findings consistent with a fibroid, no hyperplasia or malignancy. Patient happy with this news. Discussed options moving forward. She has some symptoms, although I suspect much of her pelvic symptoms are unrelated to this periurethral fibroid. I think her vaginal symptoms are likely related. We have previously discussed possible pelvi c physical therapy for her pelvic pain. In terms of surgery to remove the fibroid, given its location near the urethra, this would likely be performed by Dr. Tresa Moore, or by the two of [place] together. While she does not need to have surgery at this time, it is difficult to know whether the mass will continue to grow and become more symptomatic. She?d like to take some time to think about options and will call my clinic when she?s ready to [facility] e a decision about next steps. ? ?KTucker MD

## 2021-11-19 ENCOUNTER — Other Ambulatory Visit: Payer: Self-pay | Admitting: Urology

## 2021-12-01 ENCOUNTER — Other Ambulatory Visit (HOSPITAL_COMMUNITY): Payer: Self-pay

## 2021-12-01 NOTE — Patient Instructions (Addendum)
DUE TO COVID-19 ONLY TWO VISITORS  (aged 36 and older)  ARE ALLOWED TO COME WITH YOU AND STAY IN THE WAITING ROOM ONLY DURING PRE OP AND PROCEDURE.   ?**NO VISITORS ARE ALLOWED IN THE SHORT STAY AREA OR RECOVERY ROOM!!** ? ?IF YOU WILL BE ADMITTED INTO THE HOSPITAL YOU ARE ALLOWED ONLY FOUR SUPPORT PEOPLE DURING VISITATION HOURS ONLY (7 AM -8PM)   ?The support person(s) must pass our screening, gel in and out, and wear a mask at all times, including in the patient?s room. ?Patients must also wear a mask when staff or their support person are in the room. ?Visitors GUEST BADGE MUST BE WORN VISIBLY  ?One adult visitor may remain with you overnight and MUST be in the room by 8 P.M. ?  ? ? Your procedure is scheduled on: 12/09/21 ? ? Report to [facility] Main Entrance ? ?  Report to admitting at 6:15 AM ? ? Call this number if you have problems the morning of surgery [phone] ? ? Do not eat food :After Midnight. ? ? After Midnight you may have the following liquids until 5:30 AM DAY OF SURGERY ? ?Water ?Black Coffee (sugar ok, NO MILK/CREAM OR CREAMERS)  ?Tea (sugar ok, NO MILK/CREAM OR CREAMERS) regular and decaf                             ?Plain Jell-O (NO RED)                                           ?Fruit ices (not with fruit pulp, NO RED)                                     ?Popsicles (NO RED)                                                                  ?Juice: apple, WHITE grape, WHITE cranberry ?Sports drinks like Gatorade (NO RED) ?Clear broth(vegetable,chicken,beef) ? ?FOLLOW BOWEL PREP AND ANY ADDITIONAL PRE OP INSTRUCTIONS YOU RECEIVED FROM YOUR SURGEON'S OFFICE!!! ?  ?  ?Oral Hygiene is also important to reduce your risk of infection.                                    ?Remember - BRUSH YOUR TEETH THE MORNING OF SURGERY WITH YOUR REGULAR TOOTHPASTE ? ? Take these medicines the morning of surgery with A SIP OF WATER: None ?                  ?           You may not have any metal on your  body including hair pins, jewelry, and body piercing ? ?           Do not wear make-up, lotions, powders, perfumes, or deodorant ? ?Do not wear nail polish including gel and S&S, artificial/acrylic nails, or any other type of covering on  natural nails including finger and toenails. If you have artificial nails, gel coating, etc. that needs to be removed by a nail salon please have this removed prior to surgery or surgery may need to be canceled/ delayed if the surgeon/ anesthesia feels like they are unable to be safely monitored.  ? ?Do not shave  48 hours prior to surgery.  ? ? Do not bring valuables to the hospital. [place] NOT ?            RESPONSIBLE   FOR VALUABLES. ? ? Bring small overnight bag day of surgery. ?  ? Special Instructions: Bring a copy of your healthcare power of attorney and living will documents         the day of surgery if you haven't scanned them before. ? ?            Please read over the following fact sheets you were given: IF [place] [phone]- Apolonio Schneiders ? ?   [place] - Preparing for Surgery ?Before surgery, you can play an important role.  Because skin is not sterile, your skin needs to be as free of germs as possible.  You can reduce the number of germs on your skin by washing with CHG (chlorahexidine gluconate) soap before surgery.  CHG is an antiseptic cleaner which kills germs and bonds with the skin to continue killing germs even after washing. ?Please DO NOT use if you have an allergy to CHG or antibacterial soaps.  If your skin becomes reddened/irritated stop using the CHG and inform your nurse when you arrive at Short Stay. ?Do not shave (including legs and underarms) for at least 48 hours prior to the first CHG shower.  You may shave your face/neck. ? ?Please follow these instructions carefully: ? 1.  Shower with CHG Soap the night before surgery and the  morning of surgery. ? 2.  If you choose to wash your hair,  wash your hair first as usual with your normal  shampoo. ? 3.  After you shampoo, rinse your hair and body thoroughly to remove the shampoo.                            ? 4.  Use CHG as you would any other liquid soap.  You can apply chg directly to the skin and wash.  Gently with a scrungie or clean washcloth. ? 5.  Apply the CHG Soap to your body ONLY FROM THE NECK DOWN.   Do   not use on face/ open      ?                     Wound or open sores. Avoid contact with eyes, ears mouth and   genitals (private parts).  ?                     Production manager,  Genitals (private parts) with your normal soap. ?            6.  Wash thoroughly, paying special attention to the area where your    surgery  will be performed. ? 7.  Thoroughly rinse your body with warm water from the neck down. ? 8.  DO NOT shower/wash with your normal soap after using and rinsing off the CHG Soap. ?  9.  Pat yourself dry with a clean towel. ?           10.  Wear clean pajamas. ?           11.  Place clean sheets on your bed the night of your first shower and do not  sleep with pets. ?Day of Surgery : ?Do not apply any lotions/deodorants the morning of surgery.  Please wear clean clothes to the hospital/surgery center. ? ?FAILURE TO FOLLOW THESE INSTRUCTIONS MAY RESULT IN THE CANCELLATION OF YOUR SURGERY ? ?PATIENT SIGNATURE_________________________________ ? ?NURSE SIGNATURE__________________________________ ? ?________________________________________________________________________  ?

## 2021-12-01 NOTE — Progress Notes (Addendum)
COVID Vaccine Completed: yes x1 ?Date COVID Vaccine completed: 07/21/20 ?Has received booster: ?COVID vaccine manufacturer: Moderna   ? ?Date of COVID positive in last 90 days:  no ? ?PCP - no ?Cardiologist -  ? ?Chest x-ray - no ?EKG - no ?Stress Test - no ?ECHO - no ?Cardiac Cath - no ?Pacemaker/ICD device last checked: no ?Spinal Cord Stimulator: no ? ?Bowel Prep - no ? ?Sleep Study - no ?CPAP -  ? ?Fasting Blood Sugar - no ?Checks Blood Sugar _____ times a day ? ?Blood Thinner Instructions: no ?Aspirin Instructions: ?Last Dose: ? ?Activity level: Can go up a flight of stairs and perform activities of daily living without stopping and without symptoms of chest pain or shortness of breath. ?     ? ?Anesthesia review:  ? ?Patient denies shortness of breath, fever, cough and chest pain at PAT appointment ? ? ?Patient verbalized understanding of instructions that were given to them at the PAT appointment. Patient was also instructed that they will need to review over the PAT instructions again at home before surgery.  ?

## 2021-12-02 ENCOUNTER — Encounter (HOSPITAL_COMMUNITY)
Admission: RE | Admit: 2021-12-02 | Discharge: 2021-12-02 | Disposition: A | Discharge status: Home / Self Care | Payer: Medicaid Other | Source: Ambulatory Visit | Attending: Urology | Admitting: Urology

## 2021-12-02 ENCOUNTER — Encounter (HOSPITAL_COMMUNITY): Payer: Self-pay

## 2021-12-02 VITALS — BP 106/73 | HR 66 | Temp 98.3°F | Resp 12 | Ht 60.63 in | Wt 147.7 lb

## 2021-12-02 DIAGNOSIS — Z01812 Encounter for preprocedural laboratory examination: Secondary | ICD-10-CM | POA: Diagnosis not present

## 2021-12-02 DIAGNOSIS — D649 Anemia, unspecified: Secondary | ICD-10-CM | POA: Insufficient documentation

## 2021-12-02 HISTORY — DX: Headache, unspecified: R51.9

## 2021-12-02 HISTORY — DX: Personal history of urinary calculi: Z87.442

## 2021-12-02 LAB — CBC
HCT: 37.2 % (ref 36.0–46.0)
Hemoglobin: 11.4 g/dL — ABNORMAL LOW (ref 12.0–15.0)
MCH: 26.7 pg (ref 26.0–34.0)
MCHC: 30.6 g/dL (ref 30.0–36.0)
MCV: 87.1 fL (ref 80.0–100.0)
Platelets: 261 10*3/uL (ref 150–400)
RBC: 4.27 MIL/uL (ref 3.87–5.11)
RDW: 16.1 % — ABNORMAL HIGH (ref 11.5–15.5)
WBC: 4.2 10*3/uL (ref 4.0–10.5)
nRBC: 0 % (ref 0.0–0.2)

## 2021-12-08 NOTE — Anesthesia Preprocedure Evaluation (Addendum)
Anesthesia Evaluation  ?Patient identified by MRN, date of birth, ID band ?Patient awake ? ? ? ?Reviewed: ?Allergy & Precautions, NPO status , Patient's Chart, lab work & pertinent test results ? ?Airway ?Mallampati: II ? ?TM Distance: >3 FB ?Neck ROM: Full ? ? ? Dental ? ?(+) Teeth Intact ?  ?Pulmonary ?neg pulmonary ROS,  ?  ?Pulmonary exam normal ? ? ? ? ? ? ? Cardiovascular ?Exercise Tolerance: Good ?negative cardio ROS ? ? ?Rhythm:Regular Rate:Normal ? ? ?  ?Neuro/Psych ? Headaches, PSYCHIATRIC DISORDERS Anxiety Depression   ? GI/Hepatic ?negative GI ROS, Neg liver ROS,   ?Endo/Other  ?Hypothyroidism  ? Renal/GU ?negative Renal ROS  ? ?Urethral/vaginal mass ? ?  ?Musculoskeletal ?negative musculoskeletal ROS ?(+)  ? Abdominal ?Normal abdominal exam  (+)   ?Peds ? Hematology ? ?(+) Blood dyscrasia, anemia ,   ?Anesthesia Other Findings ? ? Reproductive/Obstetrics ? ?  ? ? ? ? ? ? ? ? ? ? ? ? ? ?  ?  ? ? ? ? ? ? ? ?Anesthesia Physical ?Anesthesia Plan ? ?ASA: 2 ? ?Anesthesia Plan: General  ? ?Post-op Pain Management: Tylenol PO (pre-op)*  ? ?Induction: Intravenous ? ?PONV Risk Score and Plan: 3 and Treatment may vary due to age or medical condition, Ondansetron, Dexamethasone, Midazolam and Scopolamine patch - Pre-op ? ?Airway Management Planned: Oral ETT ? ?Additional Equipment: None ? ?Intra-op Plan:  ? ?Post-operative Plan: Extubation in OR ? ?Informed Consent: I have reviewed the patients History and Physical, chart, labs and discussed the procedure including the risks, benefits and alternatives for the proposed anesthesia with the patient or authorized representative who has indicated his/her understanding and acceptance.  ? ? ? ?Dental advisory given ? ?Plan Discussed with: CRNA, Anesthesiologist and Surgeon ? ?Anesthesia Plan Comments:   ? ? ? ? ? ?Anesthesia Quick Evaluation ? ?

## 2021-12-09 ENCOUNTER — Encounter (HOSPITAL_COMMUNITY)
Admission: RE | Disposition: A | Discharge status: Home / Self Care | Payer: Self-pay | Source: Ambulatory Visit | Attending: Urology

## 2021-12-09 ENCOUNTER — Inpatient Hospital Stay (HOSPITAL_COMMUNITY): Payer: Medicaid Other | Admitting: Certified Registered Nurse Anesthetist

## 2021-12-09 ENCOUNTER — Inpatient Hospital Stay (HOSPITAL_COMMUNITY): Payer: Medicaid Other

## 2021-12-09 ENCOUNTER — Encounter (HOSPITAL_COMMUNITY): Payer: Self-pay | Admitting: Urology

## 2021-12-09 ENCOUNTER — Other Ambulatory Visit: Payer: Self-pay

## 2021-12-09 ENCOUNTER — Other Ambulatory Visit (HOSPITAL_COMMUNITY): Payer: Self-pay

## 2021-12-09 ENCOUNTER — Inpatient Hospital Stay (HOSPITAL_COMMUNITY)
Admission: RE | Admit: 2021-12-09 | Discharge: 2021-12-10 | DRG: 658 | Disposition: A | Discharge status: Home / Self Care | Payer: Medicaid Other | Source: Ambulatory Visit | Attending: Urology | Admitting: Urology

## 2021-12-09 DIAGNOSIS — R102 Pelvic and perineal pain: Secondary | ICD-10-CM | POA: Diagnosis present

## 2021-12-09 DIAGNOSIS — Z83438 Family history of other disorder of lipoprotein metabolism and other lipidemia: Secondary | ICD-10-CM | POA: Diagnosis not present

## 2021-12-09 DIAGNOSIS — G8929 Other chronic pain: Secondary | ICD-10-CM | POA: Diagnosis present

## 2021-12-09 DIAGNOSIS — Z833 Family history of diabetes mellitus: Secondary | ICD-10-CM | POA: Diagnosis not present

## 2021-12-09 DIAGNOSIS — N368 Other specified disorders of urethra: Secondary | ICD-10-CM

## 2021-12-09 DIAGNOSIS — Z87410 Personal history of cervical dysplasia: Secondary | ICD-10-CM

## 2021-12-09 DIAGNOSIS — Z800 Family history of malignant neoplasm of digestive organs: Secondary | ICD-10-CM | POA: Diagnosis not present

## 2021-12-09 DIAGNOSIS — Z8249 Family history of ischemic heart disease and other diseases of the circulatory system: Secondary | ICD-10-CM

## 2021-12-09 DIAGNOSIS — Z825 Family history of asthma and other chronic lower respiratory diseases: Secondary | ICD-10-CM

## 2021-12-09 DIAGNOSIS — E039 Hypothyroidism, unspecified: Secondary | ICD-10-CM | POA: Diagnosis present

## 2021-12-09 DIAGNOSIS — D304 Benign neoplasm of urethra: Secondary | ICD-10-CM | POA: Diagnosis present

## 2021-12-09 DIAGNOSIS — Z888 Allergy status to other drugs, medicaments and biological substances status: Secondary | ICD-10-CM | POA: Diagnosis not present

## 2021-12-09 DIAGNOSIS — Z8049 Family history of malignant neoplasm of other genital organs: Secondary | ICD-10-CM

## 2021-12-09 HISTORY — PX: EXCISION VAGINAL CYST: SHX5825

## 2021-12-09 HISTORY — PX: CYSTOSCOPY W/ URETERAL STENT PLACEMENT: SHX1429

## 2021-12-09 LAB — PREGNANCY, URINE: Preg Test, Ur: NEGATIVE

## 2021-12-09 LAB — HEMOGLOBIN AND HEMATOCRIT, BLOOD
HCT: 33.4 % — ABNORMAL LOW (ref 36.0–46.0)
Hemoglobin: 10.2 g/dL — ABNORMAL LOW (ref 12.0–15.0)

## 2021-12-09 SURGERY — CYSTOSCOPY, WITH RETROGRADE PYELOGRAM AND URETERAL STENT INSERTION
Anesthesia: General | Site: Vagina

## 2021-12-09 MED ORDER — LACTATED RINGERS IV SOLN: INTRAVENOUS | Status: DC

## 2021-12-09 MED ORDER — METHYLENE BLUE 1 % INJ SOLN
INTRAVENOUS | Status: DC | PRN
  Administered 2021-12-09: 10 mL

## 2021-12-09 MED ORDER — CHLORHEXIDINE GLUCONATE 0.12 % MT SOLN
15.0000 mL | Freq: Once | OROMUCOSAL | Status: AC
  Administered 2021-12-09: 15 mL via OROMUCOSAL

## 2021-12-09 MED ORDER — LIDOCAINE 2% (20 MG/ML) 5 ML SYRINGE
INTRAMUSCULAR | Status: DC | PRN
  Administered 2021-12-09: 80 mg via INTRAVENOUS

## 2021-12-09 MED ORDER — SCOPOLAMINE 1 MG/3DAYS TD PT72
1.0000 | MEDICATED_PATCH | TRANSDERMAL | Status: DC
  Administered 2021-12-09: 1.5 mg via TRANSDERMAL
  Filled 2021-12-09: qty 1

## 2021-12-09 MED ORDER — SERTRALINE HCL 100 MG PO TABS
100.0000 mg | ORAL_TABLET | Freq: Every day | ORAL | Status: DC
  Administered 2021-12-10: 100 mg via ORAL
  Filled 2021-12-09: qty 1

## 2021-12-09 MED ORDER — HYDROMORPHONE HCL 1 MG/ML IJ SOLN
0.5000 mg | INTRAMUSCULAR | Status: DC | PRN
  Administered 2021-12-09 (×3): 1 mg via INTRAVENOUS
  Filled 2021-12-09 (×3): qty 1

## 2021-12-09 MED ORDER — SODIUM CHLORIDE 0.9 % IV SOLN
INTRAVENOUS | Status: DC
Start: 2021-12-09 — End: 2021-12-10

## 2021-12-09 MED ORDER — PHENYLEPHRINE 80 MCG/ML (10ML) SYRINGE FOR IV PUSH (FOR BLOOD PRESSURE SUPPORT)
PREFILLED_SYRINGE | INTRAVENOUS | Status: AC
  Filled 2021-12-09: qty 10

## 2021-12-09 MED ORDER — ONDANSETRON HCL 4 MG/2ML IJ SOLN
4.0000 mg | INTRAMUSCULAR | Status: DC | PRN
  Administered 2021-12-09 (×2): 4 mg via INTRAVENOUS
  Filled 2021-12-09 (×2): qty 2

## 2021-12-09 MED ORDER — CLINDAMYCIN PHOSPHATE 2 % VA CREA
TOPICAL_CREAM | VAGINAL | Status: AC
  Filled 2021-12-09: qty 40

## 2021-12-09 MED ORDER — FENTANYL CITRATE PF 50 MCG/ML IJ SOSY
PREFILLED_SYRINGE | INTRAMUSCULAR | Status: AC
  Filled 2021-12-09: qty 2

## 2021-12-09 MED ORDER — LIDOCAINE-EPINEPHRINE (PF) 1 %-1:200000 IJ SOLN
INTRAMUSCULAR | Status: AC
  Filled 2021-12-09: qty 30

## 2021-12-09 MED ORDER — AMISULPRIDE (ANTIEMETIC) 5 MG/2ML IV SOLN: 10.0000 mg | Freq: Once | INTRAVENOUS | Status: DC | PRN

## 2021-12-09 MED ORDER — SULFAMETHOXAZOLE-TRIMETHOPRIM 800-160 MG PO TABS
1.0000 | ORAL_TABLET | Freq: Two times a day (BID) | ORAL | 0 refills | Status: DC
  Filled 2021-12-09: qty 6, 3d supply, fill #0

## 2021-12-09 MED ORDER — PROPOFOL 500 MG/50ML IV EMUL
INTRAVENOUS | Status: DC | PRN
  Administered 2021-12-09: 25 ug/kg/min via INTRAVENOUS

## 2021-12-09 MED ORDER — DIPHENHYDRAMINE HCL 12.5 MG/5ML PO ELIX: 12.5000 mg | ORAL_SOLUTION | Freq: Four times a day (QID) | ORAL | Status: DC | PRN

## 2021-12-09 MED ORDER — FENTANYL CITRATE PF 50 MCG/ML IJ SOSY
25.0000 ug | PREFILLED_SYRINGE | INTRAMUSCULAR | Status: DC | PRN
  Administered 2021-12-09: 25 ug via INTRAVENOUS

## 2021-12-09 MED ORDER — PROPOFOL 500 MG/50ML IV EMUL
INTRAVENOUS | Status: AC
  Filled 2021-12-09: qty 50

## 2021-12-09 MED ORDER — OXYCODONE HCL 5 MG PO TABS: 5.0000 mg | ORAL_TABLET | Freq: Once | ORAL | Status: DC | PRN

## 2021-12-09 MED ORDER — ACETAMINOPHEN 500 MG PO TABS
1000.0000 mg | ORAL_TABLET | Freq: Four times a day (QID) | ORAL | Status: AC
  Administered 2021-12-09 – 2021-12-10 (×4): 1000 mg via ORAL
  Filled 2021-12-09 (×4): qty 2

## 2021-12-09 MED ORDER — MIDAZOLAM HCL 5 MG/5ML IJ SOLN
INTRAMUSCULAR | Status: DC | PRN
  Administered 2021-12-09: 2 mg via INTRAVENOUS

## 2021-12-09 MED ORDER — 0.9 % SODIUM CHLORIDE (POUR BTL) OPTIME
TOPICAL | Status: DC | PRN
  Administered 2021-12-09: 1000 mL

## 2021-12-09 MED ORDER — MIDAZOLAM HCL 2 MG/2ML IJ SOLN
INTRAMUSCULAR | Status: AC
  Filled 2021-12-09: qty 2

## 2021-12-09 MED ORDER — FENTANYL CITRATE (PF) 100 MCG/2ML IJ SOLN
INTRAMUSCULAR | Status: DC | PRN
  Administered 2021-12-09: 50 ug via INTRAVENOUS
  Administered 2021-12-09: 100 ug via INTRAVENOUS
  Administered 2021-12-09 (×2): 50 ug via INTRAVENOUS

## 2021-12-09 MED ORDER — CEFAZOLIN SODIUM-DEXTROSE 2-4 GM/100ML-% IV SOLN
2.0000 g | INTRAVENOUS | Status: AC
  Administered 2021-12-09: 2 g via INTRAVENOUS

## 2021-12-09 MED ORDER — LIDOCAINE-EPINEPHRINE (PF) 1 %-1:200000 IJ SOLN
INTRAMUSCULAR | Status: DC | PRN
  Administered 2021-12-09: 20 mL

## 2021-12-09 MED ORDER — ONDANSETRON HCL 4 MG/2ML IJ SOLN
INTRAMUSCULAR | Status: AC
  Filled 2021-12-09: qty 2

## 2021-12-09 MED ORDER — ORAL CARE MOUTH RINSE: 15.0000 mL | Freq: Once | OROMUCOSAL | Status: AC

## 2021-12-09 MED ORDER — OXYCODONE HCL 5 MG/5ML PO SOLN: 5.0000 mg | Freq: Once | ORAL | Status: DC | PRN

## 2021-12-09 MED ORDER — SUGAMMADEX SODIUM 200 MG/2ML IV SOLN
INTRAVENOUS | Status: DC | PRN
  Administered 2021-12-09: 200 mg via INTRAVENOUS

## 2021-12-09 MED ORDER — ROCURONIUM BROMIDE 10 MG/ML (PF) SYRINGE
PREFILLED_SYRINGE | INTRAVENOUS | Status: DC | PRN
  Administered 2021-12-09: 60 mg via INTRAVENOUS

## 2021-12-09 MED ORDER — IOHEXOL 300 MG/ML  SOLN
INTRAMUSCULAR | Status: DC | PRN
  Administered 2021-12-09: 15 mL

## 2021-12-09 MED ORDER — ONDANSETRON HCL 4 MG/2ML IJ SOLN
INTRAMUSCULAR | Status: DC | PRN
  Administered 2021-12-09: 4 mg via INTRAVENOUS

## 2021-12-09 MED ORDER — ACETAMINOPHEN 500 MG PO TABS
1000.0000 mg | ORAL_TABLET | Freq: Once | ORAL | Status: AC
  Administered 2021-12-09: 1000 mg via ORAL
  Filled 2021-12-09: qty 2

## 2021-12-09 MED ORDER — OXYCODONE HCL 5 MG PO TABS
5.0000 mg | ORAL_TABLET | ORAL | Status: DC | PRN
  Administered 2021-12-09 – 2021-12-10 (×3): 5 mg via ORAL
  Filled 2021-12-09 (×4): qty 1

## 2021-12-09 MED ORDER — FENTANYL CITRATE (PF) 250 MCG/5ML IJ SOLN
INTRAMUSCULAR | Status: AC
  Filled 2021-12-09: qty 5

## 2021-12-09 MED ORDER — DOCUSATE SODIUM 100 MG PO CAPS: 100.0000 mg | ORAL_CAPSULE | Freq: Two times a day (BID) | ORAL | Status: DC

## 2021-12-09 MED ORDER — PROPOFOL 10 MG/ML IV BOLUS
INTRAVENOUS | Status: DC | PRN
  Administered 2021-12-09: 150 mg via INTRAVENOUS

## 2021-12-09 MED ORDER — HYOSCYAMINE SULFATE 0.125 MG SL SUBL
0.1250 mg | SUBLINGUAL_TABLET | SUBLINGUAL | Status: DC | PRN
Start: 2021-12-09 — End: 2021-12-10
  Filled 2021-12-09: qty 1

## 2021-12-09 MED ORDER — ONDANSETRON HCL 4 MG/2ML IJ SOLN: 4.0000 mg | Freq: Once | INTRAMUSCULAR | Status: DC | PRN

## 2021-12-09 MED ORDER — SODIUM CHLORIDE 0.9 % IR SOLN
Status: DC | PRN
  Administered 2021-12-09: 3000 mL

## 2021-12-09 MED ORDER — METHYLENE BLUE 1 % INJ SOLN
INTRAVENOUS | Status: AC
  Filled 2021-12-09: qty 10

## 2021-12-09 MED ORDER — OXYCODONE-ACETAMINOPHEN 5-325 MG PO TABS
1.0000 | ORAL_TABLET | Freq: Four times a day (QID) | ORAL | 0 refills | Status: AC | PRN
  Filled 2021-12-09: qty 20, 3d supply, fill #0

## 2021-12-09 MED ORDER — CLINDAMYCIN PHOSPHATE 2 % VA CREA
TOPICAL_CREAM | VAGINAL | Status: DC | PRN
  Administered 2021-12-09: 1 via VAGINAL

## 2021-12-09 MED ORDER — DOCUSATE SODIUM 100 MG PO CAPS
100.0000 mg | ORAL_CAPSULE | Freq: Two times a day (BID) | ORAL | Status: DC
  Administered 2021-12-09 – 2021-12-10 (×2): 100 mg via ORAL
  Filled 2021-12-09 (×2): qty 1

## 2021-12-09 MED ORDER — DEXAMETHASONE SODIUM PHOSPHATE 10 MG/ML IJ SOLN
INTRAMUSCULAR | Status: AC
  Filled 2021-12-09: qty 1

## 2021-12-09 MED ORDER — PHENYLEPHRINE 80 MCG/ML (10ML) SYRINGE FOR IV PUSH (FOR BLOOD PRESSURE SUPPORT)
PREFILLED_SYRINGE | INTRAVENOUS | Status: DC | PRN
  Administered 2021-12-09 (×2): 80 ug via INTRAVENOUS
  Administered 2021-12-09: 120 ug via INTRAVENOUS

## 2021-12-09 MED ORDER — DEXAMETHASONE SODIUM PHOSPHATE 4 MG/ML IJ SOLN
INTRAMUSCULAR | Status: DC | PRN
Start: 2021-12-09 — End: 2021-12-09
  Administered 2021-12-09: 10 mg via INTRAVENOUS

## 2021-12-09 MED ORDER — DIPHENHYDRAMINE HCL 50 MG/ML IJ SOLN: 12.5000 mg | Freq: Four times a day (QID) | INTRAMUSCULAR | Status: DC | PRN

## 2021-12-09 SURGICAL SUPPLY — 70 items
BAG COUNTER SPONGE SURGICOUNT (BAG) IMPLANT
BAG DECANTER FOR FLEXI CONT (MISCELLANEOUS) ×3 IMPLANT
BAG URINE DRAIN 2000ML AR STRL (UROLOGICAL SUPPLIES) IMPLANT
BAG URO CATCHER STRL LF (MISCELLANEOUS) ×2 IMPLANT
BASKET ZERO TIP NITINOL 2.4FR (BASKET) IMPLANT
BLADE SURG 15 STRL LF DISP TIS (BLADE) ×2 IMPLANT
BLADE SURG 15 STRL SS (BLADE) ×1
CATH FOLEY 2WAY SLVR  5CC 14FR (CATHETERS) ×1
CATH FOLEY 2WAY SLVR 5CC 14FR (CATHETERS) ×2 IMPLANT
CATH URETL OPEN END 6FR 70 (CATHETERS) ×1 IMPLANT
CLOTH BEACON ORANGE TIMEOUT ST (SAFETY) ×3 IMPLANT
COVER MAYO STAND STRL (DRAPES) ×3 IMPLANT
COVER SURGICAL LIGHT HANDLE (MISCELLANEOUS) ×3 IMPLANT
DEVICE CAPIO SLIM SINGLE (INSTRUMENTS) IMPLANT
DRAIN PENROSE 0.25X18 (DRAIN) ×2 IMPLANT
DRAPE UNDERBUTTOCKS STRL (DISPOSABLE) ×3 IMPLANT
ELECT PENCIL ROCKER SW 15FT (MISCELLANEOUS) ×3 IMPLANT
GAUZE 4X4 16PLY ~~LOC~~+RFID DBL (SPONGE) ×6 IMPLANT
GAUZE PACKING 2X5 YD STRL (GAUZE/BANDAGES/DRESSINGS) ×1 IMPLANT
GLOVE BIO SURGEON STRL SZ 6.5 (GLOVE) ×3 IMPLANT
GLOVE ECLIPSE 8.5 STRL (GLOVE) ×3 IMPLANT
GLOVE SURG LX 7.5 STRW (GLOVE) ×1
GLOVE SURG LX STRL 7.5 STRW (GLOVE) ×2 IMPLANT
GOWN STRL REUS W/ TWL XL LVL3 (GOWN DISPOSABLE) ×2 IMPLANT
GOWN STRL REUS W/TWL XL LVL3 (GOWN DISPOSABLE) ×2
GUIDEWIRE ANG ZIPWIRE 038X150 (WIRE) ×3 IMPLANT
GUIDEWIRE STR DUAL SENSOR (WIRE) IMPLANT
HOLDER FOLEY CATH W/STRAP (MISCELLANEOUS) ×3 IMPLANT
IV NS 1000ML (IV SOLUTION) ×1
IV NS 1000ML BAXH (IV SOLUTION) ×2 IMPLANT
KIT BASIN OR (CUSTOM PROCEDURE TRAY) ×3 IMPLANT
MANIFOLD NEPTUNE II (INSTRUMENTS) ×3 IMPLANT
NDL MAYO 6 CRC TAPER PT (NEEDLE) ×2 IMPLANT
NEEDLE HYPO 22GX1.5 SAFETY (NEEDLE) ×3 IMPLANT
NEEDLE MAYO 6 CRC TAPER PT (NEEDLE) IMPLANT
NS IRRIG 1000ML POUR BTL (IV SOLUTION) ×3 IMPLANT
PACK CYSTO (CUSTOM PROCEDURE TRAY) ×3 IMPLANT
PAD OB MATERNITY 4.3X12.25 (PERSONAL CARE ITEMS) ×2 IMPLANT
PANTS MESH DISP LRG (UNDERPADS AND DIAPERS) ×2 IMPLANT
PANTS MESH DISPOSABLE L (UNDERPADS AND DIAPERS) ×1
PLUG CATH AND CAP STER (CATHETERS) ×4 IMPLANT
PROTECTOR NERVE ULNAR (MISCELLANEOUS) ×3 IMPLANT
RETRACTOR LONRSTAR 16.6X16.6CM (MISCELLANEOUS) IMPLANT
RETRACTOR STAY HOOK 5MM (MISCELLANEOUS) ×3 IMPLANT
RETRACTOR STER APS 16.6X16.6CM (MISCELLANEOUS) ×3
SHEET LAVH (DRAPES) ×3 IMPLANT
SPIKE FLUID TRANSFER (MISCELLANEOUS) ×3 IMPLANT
STENT URET 6FRX24 CONTOUR (STENTS) ×2 IMPLANT
SUT ABS MONO DBL WITH NDL 48IN (SUTURE) IMPLANT
SUT CAPIO ETHIBPND (SUTURE) IMPLANT
SUT SILK 2 0 30  PSL (SUTURE)
SUT SILK 2 0 30 PSL (SUTURE) IMPLANT
SUT VIC AB 0 CT1 27 (SUTURE)
SUT VIC AB 0 CT1 27XBRD ANTBC (SUTURE) ×2 IMPLANT
SUT VIC AB 2-0 CT1 27 (SUTURE)
SUT VIC AB 2-0 CT1 27XBRD (SUTURE) ×4 IMPLANT
SUT VIC AB 2-0 SH 27 (SUTURE)
SUT VIC AB 2-0 SH 27X BRD (SUTURE) ×4 IMPLANT
SUT VIC AB 3-0 SH 27 (SUTURE) ×3
SUT VIC AB 3-0 SH 27XBRD (SUTURE) ×4 IMPLANT
SUT VIC AB 4-0 RB1 27 (SUTURE) ×2
SUT VIC AB 4-0 RB1 27XBRD (SUTURE) IMPLANT
SUT VICRYL 0 UR6 27IN ABS (SUTURE) ×4 IMPLANT
SYR 10ML LL (SYRINGE) ×3 IMPLANT
TOWEL OR 17X26 10 PK STRL BLUE (TOWEL DISPOSABLE) ×3 IMPLANT
TOWEL OR NON WOVEN STRL DISP B (DISPOSABLE) ×3 IMPLANT
TUBING CONNECTING 10 (TUBING) ×5 IMPLANT
TUBING UROLOGY SET (TUBING) ×1 IMPLANT
UNDERPAD 30X36 HEAVY ABSORB (UNDERPADS AND DIAPERS) ×3 IMPLANT
WATER STERILE IRR 1000ML POUR (IV SOLUTION) ×3 IMPLANT

## 2021-12-09 NOTE — Transfer of Care (Signed)
Immediate Anesthesia Transfer of Care Note ? ?Patient: Andrea Barry [facility] ? ?Procedure(s) Performed: CYSTOSCOPY WITH RETROGRADE PYELOGRAM/URETERAL STENT PLACEMENT (Bilateral: Vagina ) ?EXCISION  OF PERIURETHRAL FIBROID (Vagina ) ? ?Patient Location: PACU ? ?Anesthesia Type:General ? ?Level of Consciousness: awake and patient cooperative ? ?Airway & Oxygen Therapy: Patient Spontanous Breathing and Patient connected to face mask ? ?Post-op Assessment: Report given to RN and Post -op Vital signs reviewed and stable ? ?Post vital signs: Reviewed and stable ? ?Last Vitals:  ?Vitals Value Taken Time  ?BP 104/60 12/09/21 1010  ?Temp    ?Pulse 78 12/09/21 1011  ?Resp 14 12/09/21 1011  ?SpO2 100 % 12/09/21 1011  ?Vitals shown include unvalidated device data. ? ?Last Pain:  ?Vitals:  ? 12/09/21 0730  ?TempSrc: Oral  ?PainSc:   ?   ? ?  ? ?Complications: No notable events documented. ?

## 2021-12-09 NOTE — Op Note (Signed)
NAME: Andrea Barry, Andrea Barry ?MEDICAL RECORD NO: [id number] ?ACCOUNT NO: [id number] ?DATE OF BIRTH: May 16, 1986 ?FACILITY: [facility] ?LOCATION: [facility] ?PHYSICIAN: Alexis Frock, MD ? ?Operative Report  ? ?DATE OF PROCEDURE: 12/09/2021 ? ?DIAGNOSIS:  Periurethral mass with pelvic pain and obstructive voiding. ? ?PROCEDURE PERFORMED:  ?1.  Cystoscopy with bilateral retrograde pyelograms, interpretation. ?2.  Insertion of bilateral ureteral stents. ?3.  Open excision of periurethral mass. ? ?ESTIMATED BLOOD LOSS:  250 mL. ? ?COMPLICATIONS:  None. ? ?SPECIMENS:  Periurethral mass for permanent pathology. ? ?ASSISTANT:  Debbrah Alar, PA ? ?DRAINS:  Foley catheter to straight drain. ? ?FINDINGS:  ?1.  Unremarkable urethra and bladder, bilateral retrograde pyelograms.  No evidence of gross invasion of mass into bladder, urethra or ureters. ?2.  Successful placement of bilateral ureteral stents. ?3.  Approximately 2.5 cm circumscribed periurethral mass left of midline. ? ?INDICATIONS:  The patient is a very pleasant 36 year old [place] woman who was found on workup of pelvic pain, obstructive voiding to have an unusual periurethral mass.  At initial evaluation, this was highly concerning for possible carcinoma.  She  ?underwent evaluation with operative cystoscopy, retrogrades and biopsy, which fortunately favored a more benign etiology such as a leiomyoma.  However, this is causing significant local symptoms and she adamantly desired resection.  Given the very close  ?proximity of the lesion to her ureters, bladder neck and urethra, I felt that perioperative stenting for ureteral identification and protection was warranted.  Informed consent was obtained and placed in the medical record. ? ?PROCEDURE IN DETAIL:  The patient being verified, procedure being cystoscopy with retrogrades, stent placement, excision of periurethral mass confirmed.  Procedure timeout was performed.  Intravenous antibiotics were administered.  General  endotracheal  ?anesthesia induced.  The patient was placed into a medium lithotomy position.  Sterile field was created, prepped and draped the patient's infraumbilical abdomen, vagina, introitus, and proximal thighs using iodine.  An LAVH type drape was placed.   ?Initial attention was directed at cystoscopy with stent placement.  Cystourethroscopy was performed using a 22-French rigid cystoscope with offset lens.  Inspection of anterior and posterior urethra was unremarkable.  There was no evidence of  ?intraluminal neoplasm within the urethra or bladder or bladder neck.  Ureteral orifices were single bilaterally.  The left ureteral orifice was cannulated with 6-French end-hole catheter and a left retrograde pyelogram was obtained. ? ?Left retrograde pyelogram demonstrated a single left ureter, single system left kidney.  No filling defects or narrowing noted.  A ZIPwire was advanced to the level of the upper pole and a new 6 x 24 Contour type stent was placed.  Good proximal and  ?distal planes were noted.  Similarly, right retrograde pyelogram was obtained. ? ?Right retrograde pyelogram demonstrated single right ureter, single system right kidney.  No filling defects or narrowing noted.  A ZIPwire was advanced to level of upper pole and a separate 6 x 24 Contour type stent was placed using cystoscopic and  ?fluoroscopic guidance.  Good proximal and distal planes were noted.  Foley catheter was placed per urethra to straight drain, 10 mL water in the balloon.  The patient was repositioned in approximately 10 degrees of Trendelenburg. A horseshoe type vaginal ? retractor and weighted vaginal speculum were placed to allow much better visualization of the urethra and anterior vault of the vagina.  With palpation, the mass was again felt to be approximately 2.5 to 3 cm and fairly well circumscribed just to left  ?of the midline.  An incision was made approximately 4 cm in length directly onto this to the left of  midline and flaps of full-thickness vaginal tissue were carefully created in a plane just away from the mass using very careful deliberate dissection with ? Strully type scissors.  Circumferential mobilization was performed, right side and left side, anterior and inferior and by placing the neoplasm on gentle inferior traction.  Finally, the backside of the mass was very carefully separated away from  ?surrounding tissue.  Exquisite care was taken to avoid any obvious entrance into the bladder or urethra and especially the left ureter and this did not occur grossly.  I was quite happy with the completeness of the excision and this was set aside labeled ? as periurethral mass.  Additional hemostasis was achieved with point coagulation current and several small figure-of-eight 4-0 Vicryl sutures.  Bladder was then filled with approximately 200 mL of methylene blue dyed saline.  There was no evidence of  ?extravasation via the dissection site.  A 10 mL of 1% lidocaine with epinephrine was instilled and this was closed in 3 layers using running Vicryl.  An additional 10 mL of local was placed following closure. A clindamycin soaked vaginal packing was  ?carefully placed, hemostasis was excellent.  Sponge and needle counts were correct.  Procedure was terminated.  The patient tolerated the procedure well, no immediate perioperative complications.  The patient was taken to postanesthesia care in stable  ?condition.  Likely discharge home tomorrow versus the following day pending her clinical status. ? ?Please note, first assistant, Debbrah Alar, was crucial for all portions of the surgery today.  She provided invaluable retraction, suctioning, specimen manipulation, and general first assistance. ? ? ?SHW ?D: 12/09/2021 10:07:45 am T: 12/09/2021 11:21:00 pm  ?JOB: [id number]/ [id number]  ?

## 2021-12-09 NOTE — Plan of Care (Signed)
Report received from [place], [place]. VS taken upon arrival to unit. Pt oriented to room and call bell. Admission completed. ? ? ?Problem: Education: ?Goal: Knowledge of General Education information will improve ?Description: Including pain rating scale, medication(s)/side effects and non-pharmacologic comfort measures ?Outcome: Progressing ?  ?Problem: Health Behavior/Discharge Planning: ?Goal: Ability to manage health-related needs will improve ?Outcome: Progressing ?  ?Problem: Clinical Measurements: ?Goal: Ability to maintain clinical measurements within normal limits will improve ?Outcome: Progressing ?Goal: Will remain free from infection ?Outcome: Progressing ?Goal: Diagnostic test results will improve ?Outcome: Progressing ?  ?Problem: Activity: ?Goal: Risk for activity intolerance will decrease ?Outcome: Progressing ?  ?Problem: Coping: ?Goal: Level of anxiety will decrease ?Outcome: Progressing ?  ?Problem: Elimination: ?Goal: Will not experience complications related to bowel motility ?Outcome: Progressing ?Goal: Will not experience complications related to urinary retention ?Outcome: Progressing ?  ?Problem: Pain Managment: ?Goal: General experience of comfort will improve ?Outcome: Progressing ?  ?Problem: Safety: ?Goal: Ability to remain free from injury will improve ?Outcome: Progressing ?  ?

## 2021-12-09 NOTE — Brief Op Note (Signed)
12/09/2021 ? ?10:00 AM ? ?PATIENT:  Andrea Barry  36 y.o. female ? ?PRE-OPERATIVE DIAGNOSIS:  PERIURETHRAL FIBROID ? ?POST-OPERATIVE DIAGNOSIS:  PERIURETHRAL FIBROID ? ?PROCEDURE:  Procedure(s) with comments: ?CYSTOSCOPY WITH RETROGRADE PYELOGRAM/URETERAL STENT PLACEMENT (Bilateral) - 3 HRS ?EXCISION  OF PERIURETHRAL FIBROID (N/A) ? ?SURGEON:  Surgeon(s) and Role: ?   Alexis Frock, MD - Primary ? ?PHYSICIAN ASSISTANT:  ? ?ASSISTANTS: Debbrah Alar PA  ? ?ANESTHESIA:   local and general ? ?EBL:  250 mL  ? ?BLOOD ADMINISTERED:none ? ?DRAINS:  foley to gravity   ? ?LOCAL MEDICATIONS USED:  LIDOCAINE  ? ?SPECIMEN:  Source of Specimen:  periurethral mass ? ?DISPOSITION OF SPECIMEN:  PATHOLOGY ? ?COUNTS:  YES ? ?TOURNIQUET:  * No tourniquets in log * ? ?DICTATION: .Other Dictation: Dictation Number [id number] ? ?PLAN OF CARE: Admit to inpatient  ? ?PATIENT DISPOSITION:  PACU - hemodynamically stable. ?  ?Delay start of Pharmacological VTE agent (>24hrs) due to surgical blood loss or risk of bleeding: yes ? ?

## 2021-12-09 NOTE — H&P (Signed)
Andrea Barry is an 36 y.o. female.   ? ?Chief Complaint: Pre-OP Excision of Peri-urethral mass ? ?HPI:  ? ?1 - Peri-Urethral Fibroid - 2.5cm solid well circumscribed mass by MRI 2023 on eval irritative voiding. PVR 2023 "0" mL. 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 She is s/p BTL, does NOT desire further fertility. Fibroid appears completely separate from uterus. ? ? ???PMH sig for mild obesity, BTL, Cerclage with pregnancy. Has 1 and 36yo. She is originally from [place], speaks very good [place]. ? ? ??Today " Andrea Barry " is seen for excision of unusual peri-urethral mass, prior BX suggest leiyomyoma. No interval fevers. Most recent UA without infectious parameters.  ? ? ?Past Medical History:  ?Diagnosis Date  ? Anemia   ? Chronic pelvic pain in female   ? Dysuria   ? Frequency of urination   ? Headache   ? History of cervical dysplasia   ? History of cervical incompetence   ? w/ hx fetal demise in 2017 gestation 22wks  ? History of hypothyroidism   ? Last TSH (04/2017) was normal, off levothyroxine for months  ? History of kidney stones   ? Mass of urethra   ? w/ vaginal mass  ? MDD (major depressive disorder)   ? Mixed urge and stress incontinence   ? Urgency of urination   ? ? ?Past Surgical History:  ?Procedure Laterality Date  ? CERVICAL CERCLAGE N/A 12/15/2017  ? Procedure: CERCLAGE CERVICAL;  Surgeon: Osborne Oman, MD;  Location: [facility];  Service: Gynecology;  Laterality: N/A;  ? CERVICAL CERCLAGE N/A 01/22/2020  ? Procedure: CERCLAGE CERVICAL;  Surgeon: Woodroe Mode, MD;  Location: [facility];  Service: Gynecology;  Laterality: N/A;  ? CYSTOSCOPY W/ RETROGRADES Bilateral 10/28/2021  ? Procedure: CYSTOSCOPY WITH RETROGRADE PYELOGRAM AND URETHRAL BIOPSY;  Surgeon: Alexis Frock, MD;  Location: [facility];  Service: Urology;  Laterality: Bilateral;  ? LAPAROSCOPIC TUBAL LIGATION N/A  10/22/2020  ? Procedure: LAPAROSCOPIC TUBAL LIGATION;  Surgeon: Griffin Basil, MD;  Location: [facility];  Service: Gynecology;  Laterality: N/A;  ? ? ?Family History  ?Problem Relation Age of Onset  ? Diabetes Mother   ? Hypertension Mother   ? Heart disease Mother   ? Endometrial cancer Mother   ? Hyperlipidemia Father   ? COPD Father   ? Colon cancer Maternal Aunt   ? Mental illness Maternal Grandmother   ? Pancreatic cancer Neg Hx   ? Ovarian cancer Neg Hx   ? Breast cancer Neg Hx   ? Prostate cancer Neg Hx   ? ?Social History:  reports that she has never smoked. She has never used smokeless tobacco. She reports that she does not drink alcohol and does not use drugs. ? ?Allergies:  ?Allergies  ?Allergen Reactions  ? Metronidazole Other (See Comments)  ?  Dizziness, taste of iron in mouth, and darkened urine  ? ? ?No medications prior to admission.  ? ? ?No results found for this or any previous visit (from the past 48 hour(s)). ?No results found. ? ?Review of Systems  ?Constitutional:  Negative for chills and fever.  ?Genitourinary:  Positive for difficulty urinating, dyspareunia, dysuria and vaginal pain.  ?All other systems reviewed and are negative. ? ?Last menstrual period 12/02/2021, not currently breastfeeding. ?Physical Exam ?Vitals reviewed.  ?HENT:  ?   Head: Normocephalic.  ?   Nose:  Nose normal.  ?Eyes:  ?   Pupils: Pupils are equal, round, and reactive to light.  ?Cardiovascular:  ?   Rate and Rhythm: Normal rate.  ?Abdominal:  ?   General: Abdomen is flat.  ?Genitourinary: ?   Comments: No CVAT at present ?Musculoskeletal:     ?   General: Normal range of motion.  ?   Cervical back: Normal range of motion.  ?Skin: ?   General: Skin is warm.  ?Neurological:  ?   Mental Status: She is alert.  ?Psychiatric:     ?   Mood and Affect: Mood normal.  ?  ? ?Assessment/Plan ? ?Proceed as planned with cysto, bilateral retrogrades / stent placement (for ureteral protection) and open excision  of peri-urethral mass. RIsks (including non-cure and incontinence), benefits, alternatives, expecgted peri-op course discussed previously and reiterated today.  ? ?Alexis Frock, MD ?12/09/2021, 5:25 AM ? ? ? ?

## 2021-12-09 NOTE — Progress Notes (Signed)
?  Transition of Care (TOC) Screening Note ? ? ?Patient Details  ?Name: Andrea Barry [facility] ?Date of Birth: 12/11/1985 ? ? ?Transition of Care [facility]) CM/SW Contact:    ?Dessa Phi, RN ?Phone Number: ?12/09/2021, 1:25 PM ? ? ? ?Transition of Care Department [facility]) has reviewed patient and no TOC needs have been identified at this time. We will continue to monitor patient advancement through interdisciplinary progression rounds. If new patient transition needs arise, please place a TOC consult. ?  ?

## 2021-12-09 NOTE — Anesthesia Procedure Notes (Signed)
Procedure Name: Intubation ?Date/Time: 12/09/2021 8:38 AM ?Performed by: Claudia Desanctis, CRNA ?Pre-anesthesia Checklist: Patient identified, Emergency Drugs available, Suction available and Patient being monitored ?Patient Re-evaluated:Patient Re-evaluated prior to induction ?Oxygen Delivery Method: Circle system utilized ?Preoxygenation: Pre-oxygenation with 100% oxygen ?Induction Type: IV induction ?Ventilation: Mask ventilation without difficulty ?Laryngoscope Size: 2 and Miller ?Grade View: Grade I ?Tube type: Oral ?Tube size: 7.0 mm ?Number of attempts: 1 ?Airway Equipment and Method: Stylet ?Placement Confirmation: ETT inserted through vocal cords under direct vision, positive ETCO2 and breath sounds checked- equal and bilateral ?Secured at: 21 cm ?Tube secured with: Tape ?Dental Injury: Teeth and Oropharynx as per pre-operative assessment  ? ? ? ? ?

## 2021-12-09 NOTE — Discharge Instructions (Signed)
1-  Stiches - Your stitches are all dissolvable. You may notice a "loose thread" at your incisions, these are normal and require no intervention. You may cut them flush to the skin with fingernail clippers if needed for comfort.  2 - Diet - No restrictions  3 - Activity - No heavy lifting / straining (any activities that require valsalva or "bearing down") x 4 weeks. Otherwise, no restrictions.  4 - Bathing - You may shower immediately. Do not take a bath or get into swimming pool where incision sites are submersed in water x 4 weeks.   5 - Catheter - Will remain in place until removed at your next appointment. It may be cleaned with soap and water in the shower. It may be disconnected from the drain bag while in the shower to avoid tripping over the tube. You may apply Neosporin or Vaseline ointment as needed to the tip of the penis where the catheter inserts to reduce friction and irritation in this spot.   6 - When to Call the Doctor - Call MD for any fever >102, any acute wound problems, or any severe nausea / vomiting. You can call the [facility] ([phone]) 24 hours a day 365 days a year. It will roll-over to the answering service and on-call physician after hours.  

## 2021-12-09 NOTE — Anesthesia Postprocedure Evaluation (Signed)
Anesthesia Post Note ? ?Patient: Andrea Barry [facility] ? ?Procedure(s) Performed: CYSTOSCOPY WITH RETROGRADE PYELOGRAM/URETERAL STENT PLACEMENT (Bilateral: Vagina ) ?EXCISION  OF PERIURETHRAL FIBROID (Vagina ) ? ?  ? ?Patient location during evaluation: PACU ?Anesthesia Type: General ?Level of consciousness: awake ?Pain management: pain level controlled ?Vital Signs Assessment: post-procedure vital signs reviewed and stable ?Respiratory status: spontaneous breathing and respiratory function stable ?Cardiovascular status: stable ?Postop Assessment: no apparent nausea or vomiting ?Anesthetic complications: no ? ? ?No notable events documented. ? ?Last Vitals:  ?Vitals:  ? 12/09/21 1045 12/09/21 1110  ?BP: 117/77 119/87  ?Pulse: (!) 59 62  ?Resp: 14 14  ?Temp: 36.4 ?C (!) 36.4 ?C  ?SpO2: 97% 100%  ?  ?Last Pain:  ?Vitals:  ? 12/09/21 1233  ?TempSrc:   ?PainSc: 7   ? ? ?  ?  ?  ?  ?  ?  ? ?Merlinda Frederick ? ? ? ? ?

## 2021-12-10 ENCOUNTER — Other Ambulatory Visit (HOSPITAL_COMMUNITY): Payer: Self-pay

## 2021-12-10 ENCOUNTER — Encounter (HOSPITAL_COMMUNITY): Payer: Self-pay | Admitting: Urology

## 2021-12-10 LAB — BASIC METABOLIC PANEL
Anion gap: 5 (ref 5–15)
BUN: 6 mg/dL (ref 6–20)
CO2: 25 mmol/L (ref 22–32)
Calcium: 8.7 mg/dL — ABNORMAL LOW (ref 8.9–10.3)
Chloride: 109 mmol/L (ref 98–111)
Creatinine, Ser: 0.56 mg/dL (ref 0.44–1.00)
GFR, Estimated: >60 mL/min (ref >60)
Glucose, Bld: 110 mg/dL — ABNORMAL HIGH (ref 70–99)
Potassium: 4.1 mmol/L (ref 3.5–5.1)
Sodium: 139 mmol/L (ref 135–145)

## 2021-12-10 LAB — SURGICAL PATHOLOGY

## 2021-12-10 LAB — HEMOGLOBIN AND HEMATOCRIT, BLOOD
HCT: 31.2 % — ABNORMAL LOW (ref 36.0–46.0)
Hemoglobin: 9.9 g/dL — ABNORMAL LOW (ref 12.0–15.0)

## 2021-12-10 MED ORDER — CHLORHEXIDINE GLUCONATE CLOTH 2 % EX PADS
6.0000 | MEDICATED_PAD | Freq: Every day | CUTANEOUS | Status: DC
  Administered 2021-12-10: 6 via TOPICAL

## 2021-12-10 NOTE — Discharge Summary (Signed)
Physician Discharge Summary  ?Patient ID: ?[place] ?MRN: [id number] ?DOB/AGE: Oct 21, 1985 36 y.o. ? ?Admit date: 12/09/2021 ?Discharge date: 12/10/2021 ? ?Admission Diagnoses: Peri-Urethral Mass ? ?Discharge Diagnoses:  ?Principal Problem: ?  Periurethral mass ? ? ?Discharged Condition: good ? ?Hospital Course: Pt underwent open vaginal approach excision of periurethral mass and cystoscopy / bilateral stent placement on 12/09/21, the day of admission, without acute complication. She was admitted to the 4th floor Urology service post-op where she began her vigorous recovery. By the afternoon of POD 1, the day of discharge, she is ambulatory, pain controlled on PO meds, maintaining PO nutrition, and felt to be adequate for discharge. Hgb 9.9, Cr <1, surgical path pending at discharge.  Foley will be continued at discharge. Vaginal packing removed AM POD1.  ? ?Consults: None ? ?Significant Diagnostic Studies: labs: as per above ? ?Treatments: surgery: as per above ? ?Discharge Exam: ?Blood pressure 113/75, pulse 73, temperature 99.2 ?F (37.3 ?C), temperature source Oral, resp. rate 18, height '5\' 1"'$  (1.549 m), weight 68.4 kg, last menstrual period 12/02/2021, SpO2 100 %, not currently breastfeeding. ? ?NAD, AOx3, wearing glasses ?Non-labored breathing on RA ?RRR ?SNTND ?Mild serosanguinous vaginal spotting as expected that is non-foul ?No c/c/e ? ? ?Disposition: HOME ? ? ?Allergies as of 12/10/2021   ? ?   Reactions  ? Metronidazole Other (See Comments)  ? Dizziness, taste of iron in mouth, and darkened urine  ? ?  ? ?  ?Medication List  ?  ? ?TAKE these medications   ? ?docusate sodium 100 MG capsule ?Commonly known as: COLACE ?Take 1 capsule (100 mg total) by mouth 2 (two) times daily. ?  ?oxyCODONE-acetaminophen 5-325 MG tablet ?Commonly known as: Percocet ?Take 1-2 tablets by mouth every 6 (six) hours as needed for severe pain or moderate pain. ?What changed:  ?how much to take ?reasons to take this ?   ?sertraline 100 MG tablet ?Commonly known as: ZOLOFT ?TAKE 1/2 TABLET(50 MG) BY MOUTH DAILY ?What changed: See the new instructions. ?  ?sulfamethoxazole-trimethoprim 800-160 MG tablet ?Commonly known as: BACTRIM DS ?Take 1 tablet by mouth 2 (two) times daily. Start the day prior to foley removal appointment ?  ? ?  ? ? Follow-up Information   ? ? Alexis Frock, MD Follow up on 12/21/2021.   ?Specialty: Urology ?Why: at 3PM for MD visit and catheter removal. ?Contact information: ?[address] ?[place] [address] ?[phone] ? ? ?  ?  ? ?  ?  ? ?  ? ? ?Signed: ?Alexis Frock ?12/10/2021, 1:15 PM ? ? ?

## 2021-12-10 NOTE — Progress Notes (Signed)
Patient discharging home with husband.  IV removed - WNL.  Reviewed AVS and medications.  Patient instructed on foley care and changing bags at home - patient able to demonstrate without difficulty.  Emphasized importance of handwashing and foley care to avoid UTI. Patient verbalizes understanding of all instructions, no questions at this time.  Follow up already in place and OP pharmacy has delivered medications.  Patient waiting arrival of husband on NAD.  ?

## 2022-02-01 ENCOUNTER — Other Ambulatory Visit: Payer: Self-pay | Admitting: Obstetrics and Gynecology

## 2022-02-01 DIAGNOSIS — N898 Other specified noninflammatory disorders of vagina: Secondary | ICD-10-CM

## 2022-05-11 ENCOUNTER — Ambulatory Visit (INDEPENDENT_AMBULATORY_CARE_PROVIDER_SITE_OTHER): Payer: Medicaid Other | Admitting: Obstetrics and Gynecology

## 2022-05-11 VITALS — BP 110/71 | HR 80 | Ht 64.0 in | Wt 152.0 lb

## 2022-05-11 DIAGNOSIS — Z23 Encounter for immunization: Secondary | ICD-10-CM | POA: Diagnosis not present

## 2022-05-11 DIAGNOSIS — R109 Unspecified abdominal pain: Secondary | ICD-10-CM

## 2022-05-11 DIAGNOSIS — Z789 Other specified health status: Secondary | ICD-10-CM

## 2022-05-11 NOTE — Progress Notes (Unsigned)
Pt complains of abdominal pain x 3 months. Also complains of back pain that radiates to abd. Pt does not have period but having pain as if she were. Pt is having most days - sharp pain.  Pt does have pain with intercourse. Pt denies constipation or urination symptoms.

## 2022-05-11 NOTE — Progress Notes (Unsigned)
GYNECOLOGY OFFICE NOTE  History:  36 y.o. [id number] here today for new cramping.    Has always had pain with intercourse, bilateral and low in her pelvis. However, after her surgery (removal of per-urethral fibroid), has abdominal pain every day. Has period-like cramping almost every day since the surgery. Takes tylenol and lays down with no relief. Will come and go over a couple of hours. Sharp pain, gets up to 8/10.   Does have burning urination when she is having the pain. This is new since surgery. No relation of her pain to stools.   Past Medical History:  Diagnosis Date   Anemia    Chronic pelvic pain in female    Dysuria    Frequency of urination    Headache    History of cervical dysplasia    History of cervical incompetence    w/ hx fetal demise in 2017 gestation 22wks   History of hypothyroidism    Last TSH (04/2017) was normal, off levothyroxine for months   History of kidney stones    Mass of urethra    w/ vaginal mass   MDD (major depressive disorder)    Mixed urge and stress incontinence    Urgency of urination     Past Surgical History:  Procedure Laterality Date   CERVICAL CERCLAGE N/A 12/15/2017   Procedure: CERCLAGE CERVICAL;  Surgeon: Osborne Oman, MD;  Location: [facility];  Service: Gynecology;  Laterality: N/A;   CERVICAL CERCLAGE N/A 01/22/2020   Procedure: CERCLAGE CERVICAL;  Surgeon: Woodroe Mode, MD;  Location: [facility];  Service: Gynecology;  Laterality: N/A;   CYSTOSCOPY W/ RETROGRADES Bilateral 10/28/2021   Procedure: CYSTOSCOPY WITH RETROGRADE PYELOGRAM AND URETHRAL BIOPSY;  Surgeon: Alexis Frock, MD;  Location: [facility];  Service: Urology;  Laterality: Bilateral;   CYSTOSCOPY W/ URETERAL STENT PLACEMENT Bilateral 12/09/2021   Procedure: CYSTOSCOPY WITH RETROGRADE PYELOGRAM/URETERAL STENT PLACEMENT;  Surgeon: Alexis Frock, MD;  Location: [facility];  Service: Urology;  Laterality: Bilateral;  3 HRS   EXCISION VAGINAL CYST N/A  12/09/2021   Procedure: EXCISION  OF PERIURETHRAL FIBROID;  Surgeon: Alexis Frock, MD;  Location: [facility];  Service: Urology;  Laterality: N/A;   LAPAROSCOPIC TUBAL LIGATION N/A 10/22/2020   Procedure: LAPAROSCOPIC TUBAL LIGATION;  Surgeon: Griffin Basil, MD;  Location: [facility];  Service: Gynecology;  Laterality: N/A;     Current Outpatient Medications:    sertraline (ZOLOFT) 100 MG tablet, TAKE 1/2 TABLET(50 MG) BY MOUTH DAILY (Patient taking differently: Take 100 mg by mouth daily.), Disp: 30 tablet, Rfl: 6   docusate sodium (COLACE) 100 MG capsule, Take 1 capsule (100 mg total) by mouth 2 (two) times daily., Disp: , Rfl:    oxyCODONE-acetaminophen (PERCOCET) 5-325 MG tablet, Take 1-2 tablets by mouth every 6 (six) hours as needed for severe pain or moderate pain., Disp: 20 tablet, Rfl: 0   sulfamethoxazole-trimethoprim (BACTRIM DS) 800-160 MG tablet, Take 1 tablet by mouth 2 (two) times daily. Start the day prior to foley removal appointment, Disp: 6 tablet, Rfl: 0  The following portions of the patient's history were reviewed and updated as appropriate: allergies, current medications, past family history, past medical history, past social history, past surgical history and problem list.   Review of Systems:  Pertinent items noted in HPI and remainder of comprehensive ROS otherwise negative.   Objective:  Physical Exam BP 110/71   Pulse 80   Ht '5\' 4"'$  (1.626 m)  Wt 152 lb (68.9 kg)   LMP 04/11/2022   BMI 26.09 kg/m  CONSTITUTIONAL: Well-developed, well-nourished female in no acute distress.  HENT:  Normocephalic, atraumatic. External right and left ear normal. Oropharynx is clear and moist EYES: Conjunctivae and EOM are normal. Pupils are equal, round, and reactive to light. No scleral icterus.  NECK: Normal range of motion, supple, no masses SKIN: Skin is warm and dry. No rash noted. Not diaphoretic. No erythema. No pallor. NEUROLOGIC: Alert and oriented to  person, place, and time. Normal reflexes, muscle tone coordination. No cranial nerve deficit noted. PSYCHIATRIC: Normal mood and affect. Normal behavior. Normal judgment and thought content. CARDIOVASCULAR: Normal heart rate noted RESPIRATORY: Effort normal, no problems with respiration noted ABDOMEN: Soft, no distention noted.   PELVIC: ***Normal appearing external genitalia; ***normal appearing vaginal mucosa and cervix.  ***No abnormal discharge noted.  ***Pap smear obtained.  ***pelvic cultures obtained. ***Normal uterine size, no other palpable masses, ***no uterine or adnexal tenderness. MUSCULOSKELETAL: Normal range of motion. No edema noted.  ***Exam done with chaperone present.  Labs and Imaging No results found.  Assessment & Plan:  1. Abdominal pain, unspecified abdominal location ***  2. Language barrier Spanish translator used   Routine preventative health maintenance measures emphasized. Please refer to After Visit Summary for other counseling recommendations.   No follow-ups on file.   Feliz Beam, MD, [place] for [employer] [facility])

## 2022-05-13 ENCOUNTER — Encounter: Payer: Self-pay | Admitting: Obstetrics and Gynecology

## 2022-05-24 ENCOUNTER — Ambulatory Visit
Admission: RE | Admit: 2022-05-24 | Discharge: 2022-05-24 | Disposition: A | Discharge status: Home / Self Care | Payer: Medicaid Other | Source: Ambulatory Visit | Attending: Obstetrics and Gynecology | Admitting: Obstetrics and Gynecology

## 2022-05-24 DIAGNOSIS — R109 Unspecified abdominal pain: Secondary | ICD-10-CM | POA: Diagnosis present

## 2022-05-25 ENCOUNTER — Ambulatory Visit: Payer: Medicaid Other

## 2022-07-26 ENCOUNTER — Other Ambulatory Visit: Payer: Self-pay | Admitting: Obstetrics and Gynecology

## 2022-07-26 DIAGNOSIS — Z1331 Encounter for screening for depression: Secondary | ICD-10-CM

## 2022-08-13 ENCOUNTER — Other Ambulatory Visit (HOSPITAL_COMMUNITY): Payer: Self-pay

## 2022-09-28 ENCOUNTER — Other Ambulatory Visit: Payer: Self-pay | Admitting: Obstetrics and Gynecology

## 2022-09-28 DIAGNOSIS — Z1331 Encounter for screening for depression: Secondary | ICD-10-CM

## 2022-12-04 ENCOUNTER — Other Ambulatory Visit: Payer: Self-pay | Admitting: Obstetrics and Gynecology

## 2022-12-04 DIAGNOSIS — Z1331 Encounter for screening for depression: Secondary | ICD-10-CM

## 2023-08-24 ENCOUNTER — Ambulatory Visit (INDEPENDENT_AMBULATORY_CARE_PROVIDER_SITE_OTHER): Payer: Medicaid Other | Admitting: Obstetrics

## 2023-08-24 ENCOUNTER — Encounter: Payer: Self-pay | Admitting: Obstetrics

## 2023-08-24 ENCOUNTER — Telehealth: Payer: Self-pay | Admitting: Obstetrics and Gynecology

## 2023-08-24 ENCOUNTER — Telehealth: Payer: Self-pay

## 2023-08-24 VITALS — BP 123/82 | HR 85 | Ht 63.39 in | Wt 141.4 lb

## 2023-08-24 DIAGNOSIS — K5900 Constipation, unspecified: Secondary | ICD-10-CM | POA: Diagnosis not present

## 2023-08-24 DIAGNOSIS — N3946 Mixed incontinence: Secondary | ICD-10-CM

## 2023-08-24 DIAGNOSIS — R102 Pelvic and perineal pain: Secondary | ICD-10-CM

## 2023-08-24 DIAGNOSIS — R350 Frequency of micturition: Secondary | ICD-10-CM | POA: Diagnosis not present

## 2023-08-24 DIAGNOSIS — R45851 Suicidal ideations: Secondary | ICD-10-CM

## 2023-08-24 DIAGNOSIS — R351 Nocturia: Secondary | ICD-10-CM | POA: Insufficient documentation

## 2023-08-24 LAB — POCT URINALYSIS DIPSTICK
Bilirubin, UA: NEGATIVE
Blood, UA: NEGATIVE
Glucose, UA: NEGATIVE
Leukocytes, UA: NEGATIVE
Nitrite, UA: NEGATIVE
Protein, UA: POSITIVE — AB
Spec Grav, UA: >=1.03 — AB (ref 1.010–1.025)
Urobilinogen, UA: 0.2 U/dL
pH, UA: 6 (ref 5.0–8.0)

## 2023-08-24 MED ORDER — GEMTESA 75 MG PO TABS
75.0000 mg | ORAL_TABLET | Freq: Every day | ORAL | 2 refills | Status: DC
Start: 2023-08-24 — End: 2023-08-29

## 2023-08-24 MED ORDER — GEMTESA 75 MG PO TABS: 75.0000 mg | ORAL_TABLET | Freq: Every day | ORAL | Status: DC

## 2023-08-24 MED ORDER — DICLOFENAC SODIUM 1 % EX GEL: 2.0000 g | Freq: Four times a day (QID) | CUTANEOUS | 1 refills | Status: AC

## 2023-08-24 MED ORDER — LIDOCAINE 5 % EX OINT: TOPICAL_OINTMENT | CUTANEOUS | 0 refills | Status: DC

## 2023-08-24 NOTE — Assessment & Plan Note (Signed)
-   avoid fluid intake after 6pm - elevated your feet during the day or use compression socks to reduce lower extremity swelling

## 2023-08-24 NOTE — Telephone Encounter (Signed)
 Andrea Barry

## 2023-08-24 NOTE — Assessment & Plan Note (Signed)
-   pelvic girdle pain with reproducible bilateral SI joint and pubic symphysis pain - The origin of pelvic floor muscle spasm can be multifactorial, including primary, reactive to a different pain source, trauma, or even part of a centralized pain syndrome.Treatment options include pelvic floor physical therapy, local (vaginal) or oral  muscle relaxants, pelvic muscle trigger point injections or centrally acting pain medications.   - referral sent for pelvic floor PT - Rx topical lidocaine  for use as needed up to 3x/day prior to intercourse and urination for urethral pain, no suture, foreign material, diverticulum on exam - Reviewed volteran use up to 4x/day for musculoskeletal pain - continue tylenol  use PRN pain

## 2023-08-24 NOTE — Patient Instructions (Addendum)
 Mixed Incontinence (MUI):  MUI includes symptoms of both Overactive bladder (OAB) and stress incontinence (SUI).    Overactive bladder (OAB) causes bladder urgency, frequency, and having to void at night with or without leakage.  Several  treatment options exist, including behavioral changes (avoiding caffeine, etc), physical therapy, medications, and neuromodulation (ways to change the nerve signals to the bladder).   We discussed the symptoms of overactive bladder (OAB), which include urinary urgency, urinary frequency, night-time urination, with or without urge incontinence.  We discussed management including behavioral therapy (decreasing bladder irritants by following a bladder diet, urge suppression strategies, timed voids, bladder retraining), physical therapy, medication; and for refractory cases posterior tibial nerve stimulation, sacral neuromodulation, and intravesical botulinum toxin injection.   For Beta-3 agonist medication, we discussed the potential side effect of elevated blood pressure which is more likely to occur in individuals with uncontrolled hypertension. You were given sample for Gemtesa  75 mg.  It can take a month to start working so give it time, but if you have bothersome side effects call sooner and we can try a different medication.  Call us  if you have trouble filling the prescription or if it's not covered by your insurance.  Stress incontinence (SUI) causes urinary leakage with coughing, laughing, sneezing and occasionally during exercise or bending/lifting.  Treatment options include nonsurgical options such as Kegel (pelvic floor) exercises, physical therapy, pessary (vaginal device similar to a diaphragm to prevent leakage) or surgical options such as a midurethral sling.   The origin of pelvic floor muscle spasm can be multifactorial, including primary, reactive to a different pain source, trauma, or even part of a centralized pain syndrome.Treatment options include  pelvic floor physical therapy, local (vaginal) or oral  muscle relaxants, pelvic muscle trigger point injections or centrally acting pain medications.     You can use volteran gel up to 4 times a day as needed for pain, do not use if you are pregnant. Do not use with oral ibuprofen .  You have a stage 2 (out of 4) prolapse.  We discussed the fact that it is not life threatening but there are several treatment options. For treatment of pelvic organ prolapse, we discussed options for management including expectant management, conservative management, and surgical management, such as Kegels, a pessary, pelvic floor physical therapy, and specific surgical procedures.     Constipation: Our goal is to achieve formed bowel movements daily or every-other-day.  You may need to try different combinations of the following options to find what works best for you - everybody's body works differently so feel free to adjust the dosages as needed.  Some options to help maintain bowel health include:  Dietary changes (more leafy greens, vegetables and fruits; less processed foods) Fiber supplementation (Benefiber, FiberCon, Metamucil or Psyllium). Start slow and increase gradually to full dose. Over-the-counter agents such as: stool softeners (Docusate or Colace) and/or laxatives (Miralax, milk of magnesia)  "Power Pudding" is a natural mixture that may help your constipation.  To make blend 1 cup applesauce, 1 cup wheat bran, and 3/4 cup prune juice, refrigerate and then take 1 tablespoon daily with a large glass of water  as needed.   Women should try to eat at least 21 to 25 grams of fiber a day, while men should aim for 30 to 38 grams a day. You can add fiber to your diet with food or a fiber supplement such as psyllium (metamucil), benefiber, or fibercon.   Here's a look at how much dietary  fiber is found in some common foods. When buying packaged foods, check the Nutrition Facts label for fiber content. It can  vary among brands.  Fruits Serving size Total fiber (grams)*  Raspberries 1 cup 8.0  Pear 1 medium 5.5  Apple, with skin 1 medium 4.5  Banana 1 medium 3.0  Orange 1 medium 3.0  Strawberries 1 cup 3.0   Vegetables Serving size Total fiber (grams)*  Green peas, boiled 1 cup 9.0  Broccoli, boiled 1 cup chopped 5.0  Turnip greens, boiled 1 cup 5.0  Brussels sprouts, boiled 1 cup 4.0  Potato, with skin, baked 1 medium 4.0  Sweet corn, boiled 1 cup 3.5  Cauliflower, raw 1 cup chopped 2.0  Carrot, raw 1 medium 1.5   Grains Serving size Total fiber (grams)*  Spaghetti, whole-wheat, cooked 1 cup 6.0  Barley, pearled, cooked 1 cup 6.0  Bran flakes 3/4 cup 5.5  Quinoa, cooked 1 cup 5.0  Oat bran muffin 1 medium 5.0  Oatmeal, instant, cooked 1 cup 5.0  Popcorn, air-popped 3 cups 3.5  Brown rice, cooked 1 cup 3.5  Bread, whole-wheat 1 slice 2.0  Bread, rye 1 slice 2.0   Legumes, nuts and seeds Serving size Total fiber (grams)*  Split peas, boiled 1 cup 16.0  Lentils, boiled 1 cup 15.5  Black beans, boiled 1 cup 15.0  Baked beans, canned 1 cup 10.0  Chia seeds 1 ounce 10.0  Almonds 1 ounce (23 nuts) 3.5  Pistachios 1 ounce (49 nuts) 3.0  Sunflower kernels 1 ounce 3.0  *Rounded to nearest 0.5 gram. Source: [employer] for [employer], [employer] Release    Please present to [employer] center today with our interpreter

## 2023-08-24 NOTE — Assessment & Plan Note (Addendum)
-   POCT UA + protein/ketones, bladder scan 67mL - positive CST on exam - For treatment of stress urinary incontinence,  non-surgical options include expectant management, weight loss, physical therapy, as well as a pessary.  Surgical options include a midurethral sling, Burch urethropexy, and transurethral injection of a bulking agent. - referral for pelvic floor PT

## 2023-08-24 NOTE — Telephone Encounter (Signed)
 No answer her phone goes directly to voicemail as in the phone is turned off. Previously it would go to voicemail. We cannot leave a message as she has no DPR on file.

## 2023-08-24 NOTE — Addendum Note (Signed)
 Addended byWyonia Hefty T on: 08/24/2023 04:33 PM   Modules accepted: Orders, Level of Service

## 2023-08-24 NOTE — Telephone Encounter (Signed)
 Due to concern for patient welfare we called [place] Non-emergency line:  Explained concerns of patient welfare to non-emergent police response. Address on file given to police. Welfare check initiated and the officers will follow up with in office provider.

## 2023-08-24 NOTE — Assessment & Plan Note (Addendum)
-   pt reports prior referral to behavioral health in 2021 during pregnancy, however pt did not follow-up. Multiple prior behavioral health referrals placed - reports intermittent thoughts of SI using pills and decreased appetite, however does not want to leave children behind with their father or her husband due to his substance abuse history. No imminent threat to carry out plan due to concerns with children's welfare. Has capacity and verbalize understanding of consequences of SI, did not meet criteria for IVC with expressed desire to present for evaluation. Reviewed established policy, provided immediate arrangement for access to care with behavioral specialist and minimized barrier by providing translator.  - pt appeared tearful, offered behavioral health referral and provided interpreter to accompany her. Patient agreed to present.  - Provided handout and instructions with address reviewed.  - called behavioral health triage for follow-up, patient not present.  - called language services, patient reported feeling insecure and preferred to make an appointment rather than present in person today - office called patient and next of kin with no answer - office reached out to patient again prior to end of day - welfare check initiated via [facility] non-emergency line completed - referral placed for behavioral health to establish care

## 2023-08-24 NOTE — Assessment & Plan Note (Signed)
 For constipation, we reviewed the importance of a better bowel regimen.  We also discussed the importance of avoiding chronic straining, as it can exacerbate her pelvic floor symptoms; we discussed treating constipation and straining prior to surgery, as postoperative straining can lead to damage to the repair and recurrence of symptoms. We discussed initiating therapy with increasing fluid intake, fiber supplementation, stool softeners, and laxatives such as miralax.

## 2023-08-24 NOTE — Assessment & Plan Note (Signed)
-   We discussed the symptoms of overactive bladder (OAB), which include urinary urgency, urinary frequency, nocturia, with or without urge incontinence.  While we do not know the exact etiology of OAB, several treatment options exist. We discussed management including behavioral therapy (decreasing bladder irritants, urge suppression strategies, timed voids, bladder retraining), physical therapy, medication; for refractory cases posterior tibial nerve stimulation, sacral neuromodulation, and intravesical botulinum toxin injection.  For anticholinergic medications, we discussed the potential side effects of anticholinergics including dry eyes, dry mouth, constipation, cognitive impairment and urinary retention. For Beta-3 agonist medication, we discussed the potential side effect of elevated blood pressure which is more likely to occur in individuals with uncontrolled hypertension. - samples and Rx provided for gemtesa  - referral sent for pelvic floor PT

## 2023-08-24 NOTE — Progress Notes (Signed)
 Call from officer [place].  Contact made with patient, husband and children present during interview.  Verbalizing no imminent self harm to officer and reports that she will make an appointment with behavioral health.
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
## 2023-08-24 NOTE — Telephone Encounter (Signed)
 I have tried to contact the patient around 10:23 am and her father at 10:26 am. Our nurse Archie Bearded tried to reach the patient as well am 10:21 am with no luck. Patient sends calls directly to voicemail.

## 2023-08-26 NOTE — Progress Notes (Signed)
Patient [employer] approval for Andrea Barry is Pending Pa- [id number] Will contact patient when we receive response.

## 2023-08-29 ENCOUNTER — Other Ambulatory Visit: Payer: Self-pay | Admitting: Obstetrics

## 2023-08-29 MED ORDER — MIRABEGRON ER 25 MG PO TB24: 25.0000 mg | ORAL_TABLET | Freq: Every day | ORAL | 0 refills | Status: DC

## 2023-08-29 MED ORDER — MIRABEGRON ER 50 MG PO TB24: 50.0000 mg | ORAL_TABLET | Freq: Every day | ORAL | 2 refills | Status: DC

## 2023-08-29 NOTE — Progress Notes (Addendum)
Per insurance, has to try fesoterodine extended release, oxybutynin , solifenacin, tolterodine.  Submit mirabegron to assess if it is covered Trying to avoid anti-cholinergics (fesoterodine extended release, oxybutynin , solifenacin, tolterodine) due to constipation.  Please review with patient. For Beta-3 agonist medication,there is a potential side effect of elevated blood pressure which is more likely to occur in individuals with uncontrolled hypertension. It appears that your most recent blood pressure is within normal limits at 120s/80s. Please monitor your blood pressure and stop the medication if you experience any headache, chest discomfort, or shortness of breath and seek care immediately.  I have sent your prescription of mirabegron to your pharmacy. Start at 25mg  daily for 1 month, if your blood pressure remains unchanged, increase to 50mg  after 1 month and continue to monitor your blood pressure.

## 2023-08-29 NOTE — Progress Notes (Signed)
Office to review with patient:  For Beta-3 agonist medication,there is a potential side effect of elevated blood pressure which is more likely to occur in individuals with uncontrolled hypertension. It appears that your most recent blood pressure is within normal limits at 120s/80s. Please monitor your blood pressure and stop the medication if you experience any headache, chest discomfort, or shortness of breath and seek care immediately.  I have sent your prescription of mirabegron to your pharmacy. Start at 25mg  daily for 1 month, if your blood pressure remains unchanged, increase to 50mg  after 1 month and continue to monitor your blood pressure.

## 2023-08-29 NOTE — Progress Notes (Signed)
Per Medicaid. PA for Leslye Peer has been denied. Due to patient not trying and failing any other covered alternatives. See communication from the fax below.  Per your health plans criteria, this drug is covered if you meet the following: you have tried and failed two preferred drugs: fesoterodine extended release, oxybutynin , solifenacin, tolterodine. Would you like to try one of the listed alternatives.

## 2023-08-30 ENCOUNTER — Telehealth: Payer: Self-pay

## 2023-08-30 NOTE — Telephone Encounter (Signed)
PA for Mirabegron has been submitted with [employer] via phone call as I could not pull her up in cover my meds. Will await return communication.

## 2023-08-31 NOTE — Telephone Encounter (Signed)
Medicaid has also denied the Mirabegron. Recommendations for alternatives are as follows :  Fesoterodine, oxybutynin, oxybutynin, solifenacin or tolterodine.  I can attempt to try an appeal. I know you would prefer not to use them ones listed above But due to no other tried and failed medications, I'm not sure the appeal would work. Please advise on next steps.

## 2023-09-07 ENCOUNTER — Encounter (INDEPENDENT_AMBULATORY_CARE_PROVIDER_SITE_OTHER): Payer: Self-pay

## 2023-09-29 ENCOUNTER — Other Ambulatory Visit: Payer: Self-pay | Admitting: Obstetrics and Gynecology

## 2023-09-29 DIAGNOSIS — Z1331 Encounter for screening for depression: Secondary | ICD-10-CM
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
## 2023-11-17 ENCOUNTER — Encounter: Payer: Medicaid Other | Attending: Obstetrics | Admitting: Physical Therapy

## 2023-11-17 ENCOUNTER — Telehealth: Payer: Self-pay | Admitting: Physical Therapy

## 2023-11-17 NOTE — Telephone Encounter (Signed)
 Called patient with interpreter about her missed appointment today at 9:30. Left a message.  Eulis Foster, PT @4 /10/25@ 9:48 AM

## 2023-11-23 ENCOUNTER — Ambulatory Visit: Payer: Medicaid Other | Admitting: Obstetrics

## 2023-11-23 NOTE — Progress Notes (Deleted)
 [facility] Return Visit  SUBJECTIVE  History of Present Illness: Andrea Barry is a 38 y.o. female seen in follow-up for pelvic pain, mixed urinary incontinence, nocturia, constipation, and urinary frequency. Plan at last visit was ***.   SI  Past Medical History: Patient  has a past medical history of Anemia, Chronic pelvic pain in female, Depression, Dysuria, Frequency of urination, Headache, History of cervical dysplasia, History of cervical incompetence, History of hypothyroidism, History of kidney stones, Mass of urethra, MDD (major depressive disorder), Mixed urge and stress incontinence, and Urgency of urination.   Past Surgical History: She  has a past surgical history that includes Cervical cerclage (N/A, 12/15/2017); Cervical cerclage (N/A, 01/22/2020); Laparoscopic tubal ligation (N/A, 10/22/2020); Cystoscopy w/ retrogrades (Bilateral, 10/28/2021); Cystoscopy w/ ureteral stent placement (Bilateral, 12/09/2021); and Excision vaginal cyst (N/A, 12/09/2021).   Medications: She has a current medication list which includes the following prescription(s): collagen-vitamin c-biotin, diclofenac sodium, lidocaine, mirabegron er, mirabegron er, and sertraline.   Allergies: Patient is allergic to metronidazole.   Social History: Patient  reports that she has never smoked. She has never used smokeless tobacco. She reports that she does not drink alcohol and does not use drugs.     OBJECTIVE     Physical Exam: There were no vitals filed for this visit. Gen: No apparent distress, A&O x 3.  Detailed Urogynecologic Evaluation:  Deferred. Prior exam showed:      No data to display             ASSESSMENT AND PLAN    Andrea Barry is a 38 y.o. with:  No diagnosis found.  There are no diagnoses linked to this encounter.   Darlene Ehlers, MD

## 2023-11-24 ENCOUNTER — Ambulatory Visit: Payer: Medicaid Other | Admitting: Obstetrics

## 2023-11-24 ENCOUNTER — Encounter: Payer: Medicaid Other | Admitting: Physical Therapy

## 2023-12-01 ENCOUNTER — Encounter: Payer: Medicaid Other | Admitting: Physical Therapy

## 2023-12-08 ENCOUNTER — Encounter: Payer: Medicaid Other | Admitting: Physical Therapy

## 2024-05-07 ENCOUNTER — Ambulatory Visit: Payer: Self-pay

## 2024-05-07 NOTE — Telephone Encounter (Signed)
 FYI Only or Action Required?: FYI only for provider.  Patient was last seen in primary care on na.  Called Nurse Triage reporting Sore Throat.  Symptoms began about a month ago.  Interventions attempted: Nothing.  Symptoms are: gradually worsening.  Triage Disposition: See Physician Within 24 Hours  Patient/caregiver understands and will follow disposition?: Yes    Copied from CRM #[id number]. Topic: Clinical - Red Word Triage >> May 07, 2024  8:43 AM Montie POUR wrote: Red Word that prompted transfer to Nurse Triage:  She has a sore throat and she can hardly swallow. This has been going on for about a month ago; it got better; 2 weeks ago it started back and getting worse; pain level is a 10 Reason for Disposition  SEVERE throat pain (e.g., excruciating)  Answer Assessment - Initial Assessment Questions 1. ONSET: When did the throat start hurting? (Hours or days ago)     Month ago 2. SEVERITY: How bad is the sore throat? (Scale 1-10; mild, moderate or severe)     States can hardly swallow 3. STREP EXPOSURE: Has there been any exposure to strep within the past week? If Yes, ask: What type of contact occurred?      unknown 4.  VIRAL SYMPTOMS: Are there any symptoms of a cold, such as a runny nose, cough, hoarse voice or red eyes?      Hoarse voice, snot in my nose 5. FEVER: Do you have a fever? If Yes, ask: What is your temperature, how was it measured, and when did it start?     deneis 6. PUS ON THE TONSILS: Is there pus on the tonsils in the back of your throat?     denies 7. OTHER SYMPTOMS: Do you have any other symptoms? (e.g., difficulty breathing, headache, rash)     sob 8. PREGNANCY: Is there any chance you are pregnant? When was your last menstrual period?     na  Protocols used: Sore Throat-A-AH

## 2024-05-08 ENCOUNTER — Ambulatory Visit
Admission: RE | Admit: 2024-05-08 | Discharge: 2024-05-08 | Disposition: A | Discharge status: Home / Self Care | Source: Ambulatory Visit | Attending: Family Medicine | Admitting: Family Medicine

## 2024-05-08 VITALS — BP 105/73 | HR 72 | Temp 98.8°F | Resp 16

## 2024-05-08 DIAGNOSIS — K1230 Oral mucositis (ulcerative), unspecified: Secondary | ICD-10-CM

## 2024-05-08 MED ORDER — DIPHENHYDRAMINE HCL 12.5 MG/5ML PO LIQD: 5.0000 mL | Freq: Three times a day (TID) | ORAL | 0 refills | Status: AC | PRN

## 2024-05-08 NOTE — ED Triage Notes (Signed)
 Pt present with c/o blisters in her mouth x one month. Pt states when she inhaled she felt her throat sore and has had a change in voice. States she has not taken anything fro relief. Pt takes tylenol  for temporary temporary relief. States she uses oral gel to brush her teeth.

## 2024-05-08 NOTE — Discharge Instructions (Addendum)
 Please follow-up with your PCP as soon as possible for further evaluation.  Use the Magic mouthwash 3 times a day as needed for your mouth pain.  Please go to the ER if you develop any worsening symptoms prior to seeing your PCP.  I hope you feel better soon exclamation
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
## 2024-05-09 ENCOUNTER — Ambulatory Visit: Admitting: Family Medicine

## 2024-06-28 ENCOUNTER — Encounter: Payer: Self-pay | Admitting: Family Medicine

## 2024-06-28 ENCOUNTER — Ambulatory Visit (INDEPENDENT_AMBULATORY_CARE_PROVIDER_SITE_OTHER): Admitting: Family Medicine

## 2024-06-28 VITALS — BP 125/87 | HR 79 | Ht 63.0 in | Wt 144.0 lb

## 2024-06-28 DIAGNOSIS — K219 Gastro-esophageal reflux disease without esophagitis: Secondary | ICD-10-CM | POA: Diagnosis not present

## 2024-06-28 DIAGNOSIS — K1230 Oral mucositis (ulcerative), unspecified: Secondary | ICD-10-CM | POA: Diagnosis not present

## 2024-06-28 DIAGNOSIS — Z7689 Persons encountering health services in other specified circumstances: Secondary | ICD-10-CM

## 2024-06-28 DIAGNOSIS — E038 Other specified hypothyroidism: Secondary | ICD-10-CM

## 2024-06-28 DIAGNOSIS — Z789 Other specified health status: Secondary | ICD-10-CM

## 2024-06-28 MED ORDER — OMEPRAZOLE 40 MG PO CPDR: 40.0000 mg | DELAYED_RELEASE_CAPSULE | Freq: Every day | ORAL | 3 refills | Status: AC

## 2024-06-28 NOTE — Progress Notes (Signed)
 New Patient Office Visit  Subjective    Patient ID: Andrea Barry, female    DOB: [birth year]  Age: 38 y.o. MRN: [id number]  CC:  Chief Complaint  Patient presents with   Establish Care    Pt reports burning in her throat when she is eating. Has been going for about 3 months.   Pt also reports dizziness     HPI Andrea Barry presents to establish care. Patient reports that she was seen in UC as she had redness and rash in her mouth. She has pain and burning in her mouth and throat. She was given a mouth rinse that was helpful. Her symptoms have improved. She also has hypothyroidism.    Outpatient Encounter Medications as of 06/28/2024  Medication Sig   Collagen-Vitamin C-Biotin (COLLAGEN PO) Take by mouth.   omeprazole (PRILOSEC) 40 MG capsule Take 1 capsule (40 mg total) by mouth daily.   sertraline  (ZOLOFT ) 100 MG tablet TAKE 1 TABLET(100 MG) BY MOUTH DAILY   diclofenac  Sodium (VOLTAREN ) 1 % GEL Apply 2 g topically 4 (four) times daily. As needed for back and lower abdominal pain (Patient not taking: Reported on 06/28/2024)   magic mouthwash (lidocaine , diphenhydrAMINE , alum & mag hydroxide) suspension Swish and spit 5 mLs 3 (three) times daily as needed for mouth pain. (Patient not taking: Reported on 06/28/2024)   mirabegron  ER (MYRBETRIQ ) 25 MG TB24 tablet Take 1 tablet (25 mg total) by mouth daily.   mirabegron  ER (MYRBETRIQ ) 50 MG TB24 tablet Take 1 tablet (50 mg total) by mouth daily.   No facility-administered encounter medications on file as of 06/28/2024.    Past Medical History:  Diagnosis Date   Anemia    Chronic pelvic pain in female    Depression    Dysuria    Frequency of urination    Headache    History of cervical dysplasia    History of cervical incompetence    w/ hx fetal demise in 2017 gestation 22wks   History of hypothyroidism    Last TSH (04/2017) was normal, off levothyroxine for months   History of kidney stones    Mass of  urethra    w/ vaginal mass   MDD (major depressive disorder)    Mixed urge and stress incontinence    Urgency of urination     Past Surgical History:  Procedure Laterality Date   CERVICAL CERCLAGE N/A 12/15/2017   Procedure: CERCLAGE CERVICAL;  Surgeon: Herchel Gloris LABOR, MD;  Location: [facility];  Service: Gynecology;  Laterality: N/A;   CERVICAL CERCLAGE N/A 01/22/2020   Procedure: CERCLAGE CERVICAL;  Surgeon: Eveline Lynwood MATSU, MD;  Location: [facility];  Service: Gynecology;  Laterality: N/A;   CYSTOSCOPY W/ RETROGRADES Bilateral 10/28/2021   Procedure: CYSTOSCOPY WITH RETROGRADE PYELOGRAM AND URETHRAL BIOPSY;  Surgeon: Alvaro Hummer, MD;  Location: [facility];  Service: Urology;  Laterality: Bilateral;   CYSTOSCOPY W/ URETERAL STENT PLACEMENT Bilateral 12/09/2021   Procedure: CYSTOSCOPY WITH RETROGRADE PYELOGRAM/URETERAL STENT PLACEMENT;  Surgeon: Alvaro Hummer, MD;  Location: [facility];  Service: Urology;  Laterality: Bilateral;  3 HRS   EXCISION VAGINAL CYST N/A 12/09/2021   Procedure: EXCISION  OF PERIURETHRAL FIBROID;  Surgeon: Alvaro Hummer, MD;  Location: [facility];  Service: Urology;  Laterality: N/A;   LAPAROSCOPIC TUBAL LIGATION N/A 10/22/2020   Procedure: LAPAROSCOPIC TUBAL LIGATION;  Surgeon: Zina Jerilynn LABOR, MD;  Location: [facility];  Service: Gynecology;  Laterality: N/A;  Family History  Problem Relation Age of Onset   Diabetes Mother    Hypertension Mother    Heart disease Mother    Endometrial cancer Mother    Hyperlipidemia Father    COPD Father    Mental illness Maternal Grandmother    Colon cancer Maternal Aunt    Pancreatic cancer Neg Hx    Ovarian cancer Neg Hx    Breast cancer Neg Hx    Prostate cancer Neg Hx    Bladder Cancer Neg Hx     Social History   Socioeconomic History   Marital status: Single    Spouse name: Not on file   Number of children: Not on file   Years of education: Not on file   Highest education  level: Professional school degree (e.g., MD, DDS, DVM, JD)  Occupational History   Not on file  Tobacco Use   Smoking status: Never   Smokeless tobacco: Never  Vaping Use   Vaping status: Never Used  Substance and Sexual Activity   Alcohol use: No   Drug use: No   Sexual activity: Yes    Partners: Male    Birth control/protection: Surgical  Other Topics Concern   Not on file  Social History Narrative   Not on file   Social Drivers of Health   Financial Resource Strain: Medium Risk (06/25/2024)   Overall Financial Resource Strain (CARDIA)    Difficulty of Paying Living Expenses: Somewhat hard  Food Insecurity: Food Insecurity Present (06/25/2024)   Hunger Vital Sign    Worried About Running Out of Food in the Last Year: Sometimes true    Ran Out of Food in the Last Year: Sometimes true  Transportation Needs: Unmet Transportation Needs (06/25/2024)   PRAPARE - Transportation    Lack of Transportation (Medical): Yes    Lack of Transportation (Non-Medical): Yes  Physical Activity: Insufficiently Active (06/25/2024)   Exercise Vital Sign    Days of Exercise per Week: 3 days    Minutes of Exercise per Session: 20 min  Stress: Stress Concern Present (06/25/2024)   [employer] of Occupational Health - Occupational Stress Questionnaire    Feeling of Stress: To some extent  Social Connections: Socially Isolated (06/25/2024)   Social Connection and Isolation Panel    Frequency of Communication with Friends and Family: Once a week    Frequency of Social Gatherings with Friends and Family: Once a week    Attends Religious Services: Never    Database Administrator or Organizations: No    Attends Engineer, Structural: Not on file    Marital Status: Married  Catering Manager Violence: Not At Risk (06/28/2024)   Humiliation, Afraid, Rape, and Kick questionnaire    Fear of Current or Ex-Partner: No    Emotionally Abused: No    Physically Abused: No    Sexually Abused:  No    Review of Systems  All other systems reviewed and are negative.       Objective   BP 125/87   Pulse 79   Ht 5' 3 (1.6 m)   Wt 144 lb (65.3 kg)   LMP 06/20/2024   SpO2 98%   BMI 25.51 kg/m   Physical Exam Vitals and nursing note reviewed.  Constitutional:      General: She is not in acute distress. HENT:     Mouth/Throat:     Mouth: Mucous membranes are moist.     Pharynx: Oropharynx is clear. No oropharyngeal exudate.  Neck:     Thyroid : Thyromegaly present. No thyroid  tenderness.  Cardiovascular:     Rate and Rhythm: Normal rate and regular rhythm.  Pulmonary:     Effort: Pulmonary effort is normal.     Breath sounds: Normal breath sounds.  Abdominal:     Palpations: Abdomen is soft.     Tenderness: There is no abdominal tenderness.  Musculoskeletal:     Cervical back: Normal range of motion and neck supple.  Neurological:     General: No focal deficit present.     Mental Status: She is alert and oriented to person, place, and time.         Assessment & Plan:   Gastroesophageal reflux disease, unspecified whether esophagitis present  Other specified hypothyroidism -     TSH + free T4  Mucositis oral -     Ambulatory referral to ENT  Encounter to establish care -     AMB Referral [facility] Care Management  Other orders -     Omeprazole ; Take 1 capsule (40 mg total) by mouth daily.  Dispense: 30 capsule; Refill: 3     No follow-ups on file.   Tanda Raguel SQUIBB, MD

## 2024-06-29 ENCOUNTER — Ambulatory Visit: Payer: Self-pay | Admitting: Family Medicine

## 2024-06-29 LAB — TSH+FREE T4
Free T4: 0.95 ng/dL (ref 0.82–1.77)
TSH: 5.25 u[IU]/mL — ABNORMAL HIGH (ref 0.450–4.500)

## 2024-06-29 NOTE — Progress Notes (Signed)
 Patient reviewed lab results and provider recommendations via MyChart

## 2024-07-06 ENCOUNTER — Encounter (INDEPENDENT_AMBULATORY_CARE_PROVIDER_SITE_OTHER): Payer: Self-pay

## 2024-07-11 ENCOUNTER — Telehealth: Payer: Self-pay

## 2024-07-11 NOTE — Progress Notes (Signed)
 Complex Care Management Note  Care Guide Note 07/11/2024 Name: Omah Dewalt MRN: [id number] DOB: 01-23-1986  [place] Suzie Clint Raoul is a 38 y.o. year old female who sees Tanda Bleacher, MD for primary care. I reached out to [employer] by phone today to offer complex care management services.  Ms. Jadira Nierman was given information about Complex Care Management services today including:   The Complex Care Management services include support from the care team which includes your Nurse Care Manager, Clinical Social Worker, or Pharmacist.  The Complex Care Management team is here to help remove barriers to the health concerns and goals most important to you. Complex Care Management services are voluntary, and the patient may decline or stop services at any time by request to their care team member.   Complex Care Management Consent Status: Patient agreed to services and verbal consent obtained.   Follow up plan:  Telephone appointment with complex care management team member scheduled for:  07/17/24 and 07/31/24  Encounter Outcome:  Patient Scheduled  Leotis Rase [facility], [facility] Guide  Direct Dial: [phone]  Fax [phone]

## 2024-07-17 ENCOUNTER — Other Ambulatory Visit: Payer: Self-pay

## 2024-07-17 NOTE — Patient Instructions (Signed)
 Visit Information  Andrea Barry was given information about Medicaid Managed Care team care coordination services as a part of their [facility] Community Plan Medicaid benefit.   If you would like to schedule transportation through your [facility], please call the following number at least 2 days in advance of your appointment: [phone]   Rides for urgent appointments can also be made after hours by calling Member Services.  Call the Behavioral Health Crisis Line at [phone], at any time, 24 hours a day, 7 days a week. If you are in danger or need immediate medical attention call 911.  Please see education materials related to utility and transportation resources provided by email.   Care plan and visit instructions communicated with the patient verbally today. Patient agrees to receive a copy in MyChart. Active MyChart status and patient understanding of how to access instructions and care plan via MyChart confirmed with patient.     Telephone follow up appointment with Managed Medicaid care management team member scheduled for: 07/24/2024 at 2pm  [name], [place]/[facility] - [facility] Social Worker [phone]   Following is a copy of your plan of care:   Goals Addressed             This Visit's Progress    BSW Goal   On track    Current SDOH Barriers:  Utility assistance resources Transportation  Interventions: Patient interviewed and appropriate screenings performed Referred patient to community resources  Provided patient with information about transportation and utility assistance resources. Patient declined food resources at this time but agreed to reach out if needed.  Discussed plans with patient for ongoing follow up and provided patient with direct contact number Advised patient to f/u with medicaid transportation for medical appointment transportation.

## 2024-07-17 NOTE — Patient Outreach (Signed)
 Social Drivers of Health  Community Resource and Care Coordination Visit Note   07/17/2024  Name: Krysia Zahradnik MRN: [id number] DOB:08-29-1985  Situation: Referral received for [facility] needs assessment and assistance related to Transportation Financial Strain  utility assistance. I obtained verbal consent from Patient.  Visit completed with Patient on the phone.   Background:   SDOH Interventions Today    Flowsheet Row Most Recent Value  SDOH Interventions   Food Insecurity Interventions Intervention Not Indicated  [patient is ok with food right and declined resources.]  Housing Interventions Intervention Not Indicated  Transportation Interventions Patient Resources (Friends/Family), Payor Benefit  Utilities Interventions --  [BSW will provide utility assistance resources for [place].]     Assessment:   Goals Addressed             This Visit's Progress    BSW Goal   On track    Current SDOH Barriers:  Utility assistance resources Transportation  Interventions: Patient interviewed and appropriate screenings performed Referred patient to community resources  Provided patient with information about transportation and utility assistance resources. Patient declined food resources at this time but agreed to reach out if needed.  Discussed plans with patient for ongoing follow up and provided patient with direct contact number Advised patient to f/u with medicaid transportation for medical appointment transportation.          Recommendation:   attend all scheduled provider appointments call for transportation assistance at least one week before appointments ask for help if you don't understand your health insurance benefits F/u on utility assistance rsources  Follow Up Plan:   Telephone follow up appointment date/time:  07/24/2024 at 2pm  [name], [place]/[facility] - [facility] Social Worker [phone]

## 2024-07-24 ENCOUNTER — Ambulatory Visit (INDEPENDENT_AMBULATORY_CARE_PROVIDER_SITE_OTHER)

## 2024-07-24 ENCOUNTER — Other Ambulatory Visit: Payer: Self-pay

## 2024-07-24 ENCOUNTER — Encounter (INDEPENDENT_AMBULATORY_CARE_PROVIDER_SITE_OTHER): Payer: Self-pay

## 2024-07-24 VITALS — BP 143/90 | Temp 98.0°F | Wt 144.0 lb

## 2024-07-24 DIAGNOSIS — J343 Hypertrophy of nasal turbinates: Secondary | ICD-10-CM

## 2024-07-24 DIAGNOSIS — R0981 Nasal congestion: Secondary | ICD-10-CM

## 2024-07-24 DIAGNOSIS — J324 Chronic pansinusitis: Secondary | ICD-10-CM

## 2024-07-24 DIAGNOSIS — K146 Glossodynia: Secondary | ICD-10-CM

## 2024-07-24 MED ORDER — FLUTICASONE PROPIONATE 50 MCG/ACT NA SUSP: 2.0000 | Freq: Every day | NASAL | 6 refills | Status: AC

## 2024-07-24 MED ORDER — VITAMIN B-12 1000 MCG PO TABS: 1000.0000 ug | ORAL_TABLET | Freq: Every day | ORAL | 0 refills | Status: AC

## 2024-07-24 MED ORDER — IPRATROPIUM BROMIDE 0.03 % NA SOLN: 2.0000 | Freq: Three times a day (TID) | NASAL | 12 refills | Status: AC | PRN

## 2024-07-24 MED ORDER — AMOXICILLIN-POT CLAVULANATE 875-125 MG PO TABS: 1.0000 | ORAL_TABLET | Freq: Two times a day (BID) | ORAL | 0 refills | Status: AC

## 2024-07-24 NOTE — Progress Notes (Signed)
 Dear Dr. Tanda, Here is my assessment for our mutual patient, Andrea Barry. Thank you for allowing me the opportunity to care for your patient. Please do not hesitate to contact me should you have any other questions. Sincerely, Dr. Penne Croak  Otolaryngology Clinic Note Referring provider: Dr. Tanda HPI:  Discussed the use of AI scribe software for clinical note transcription with the patient, who gave verbal consent to proceed.  History of Present Illness Andrea Barry is a 38 year old female who presents for evaluation of recurrent glossodynia, oral rash, and chronic nasal congestion.  Oral mucosal pain and eruptions - Episodic burning pain and erythematous papular eruptions involving the tongue and buccal mucosa since August 2025 - Persistent glossodynia with recurrent flares, primarily affecting the tongue and adjacent mucosa - Initial episode resolved after approximately one week; subsequent recurrences have been less severe - Flares associated with diminished gustatory sensation - Transient episode of aphonia lasting four days - Symptomatic relief achieved with topical oral gels and mouth rinse prescribed in the emergency department - Acetaminophen  used during periods of significant pain - No antibiotic use for these symptoms  Chronic nasal congestion and sinus symptoms - Chronic nasal congestion with copious mucous drainage - Symptoms exacerbated by head dependency - Associated facial pressure and pain - Frequent use of paper towels to manage drainage - Nasal symptoms worsened by cold exposure - Accompanied by sneezing, ocular pruritus, and intermittent dyspnea - No formal diagnosis of allergic rhinitis - No use of sinus rinses - No recent sick contacts  Independent Review of Additional Tests or Records:  Reviewed external note from referring PCP, Wilson,describing relevant history incorporated into todays evaluation.    PMH/Meds/All/SocHx/FamHx/ROS:   Past Medical History:  Diagnosis Date   Anemia    Chronic pelvic pain in female    Depression    Dysuria    Frequency of urination    Headache    History of cervical dysplasia    History of cervical incompetence    w/ hx fetal demise in 2017 gestation 22wks   History of hypothyroidism    Last TSH (04/2017) was normal, off levothyroxine for months   History of kidney stones    Mass of urethra    w/ vaginal mass   MDD (major depressive disorder)    Mixed urge and stress incontinence    Urgency of urination      Past Surgical History:  Procedure Laterality Date   CERVICAL CERCLAGE N/A 12/15/2017   Procedure: CERCLAGE CERVICAL;  Surgeon: Herchel Gloris LABOR, MD;  Location: [facility];  Service: Gynecology;  Laterality: N/A;   CERVICAL CERCLAGE N/A 01/22/2020   Procedure: CERCLAGE CERVICAL;  Surgeon: Eveline Lynwood MATSU, MD;  Location: [facility];  Service: Gynecology;  Laterality: N/A;   CYSTOSCOPY W/ RETROGRADES Bilateral 10/28/2021   Procedure: CYSTOSCOPY WITH RETROGRADE PYELOGRAM AND URETHRAL BIOPSY;  Surgeon: Alvaro Hummer, MD;  Location: [facility];  Service: Urology;  Laterality: Bilateral;   CYSTOSCOPY W/ URETERAL STENT PLACEMENT Bilateral 12/09/2021   Procedure: CYSTOSCOPY WITH RETROGRADE PYELOGRAM/URETERAL STENT PLACEMENT;  Surgeon: Alvaro Hummer, MD;  Location: [facility];  Service: Urology;  Laterality: Bilateral;  3 HRS   EXCISION VAGINAL CYST N/A 12/09/2021   Procedure: EXCISION  OF PERIURETHRAL FIBROID;  Surgeon: Alvaro Hummer, MD;  Location: [facility];  Service: Urology;  Laterality: N/A;   LAPAROSCOPIC TUBAL LIGATION N/A 10/22/2020   Procedure: LAPAROSCOPIC TUBAL LIGATION;  Surgeon: Zina Jerilynn LABOR, MD;  Location: [facility];  Service: Gynecology;  Laterality: N/A;    Family History  Problem Relation Age of Onset   Diabetes Mother    Hypertension Mother    Heart disease Mother    Endometrial cancer Mother     Hyperlipidemia Father    COPD Father    Mental illness Maternal Grandmother    Colon cancer Maternal Aunt    Pancreatic cancer Neg Hx    Ovarian cancer Neg Hx    Breast cancer Neg Hx    Prostate cancer Neg Hx    Bladder Cancer Neg Hx      Social Connections: Socially Isolated (06/25/2024)   Social Connection and Isolation Panel    Frequency of Communication with Friends and Family: Once a week    Frequency of Social Gatherings with Friends and Family: Once a week    Attends Religious Services: Never    Database Administrator or Organizations: No    Attends Engineer, Structural: Not on file    Marital Status: Married     Current Medications[1]   Physical Exam:   BP (!) 143/90 (BP Location: Left Arm, Patient Position: Sitting, Cuff Size: Normal)   Temp 98 F (36.7 C)   Wt 144 lb (65.3 kg)   LMP 06/20/2024   SpO2 99%   BMI 25.51 kg/m   The patient was awake, alert, and appropriate. The external ears were inspected, and otoscopy was performed to evaluate the external auditory canals and tympanic membranes. The nasal cavity and septum were examined for mucosal changes, obstruction, or discharge. The oral cavity and oropharynx were inspected for mucosal lesions, infection, or tonsillar hypertrophy. The neck was palpated for lymphadenopathy, thyroid  abnormalities, or other masses. Cranial nerve function was grossly intact.  Pertinent Findings: General: Well developed, well nourished. No acute distress. Voice without hoarseness Head/Face: Normocephalic. No sinus tenderness. Facial nerve intact and equal bilaterally. No facial lacerations. Eyes: PERRL, no scleral icterus or conjunctival hemorrhage. EOMI. Ears: No gross deformity. Normal external canal. Tympanic membrane with normal landmarks bilaterally Hearing: Normal speech reception.  Nose: No gross deformity or lesions. No purulent discharge. turbinate hypertrophy. Mucosal edema Mouth/Oropharynx: Lips without any  lesions. Dentition . No mucosal lesions within the oropharynx. No tonsillar enlargement, exudate, or lesions. Pharyngeal walls symmetrical. Uvula midline. Tongue midline without lesions. Larynx: See TFL if applicable Nasopharynx: See TFL if applicable Neck: Trachea midline. No masses. No thyromegaly or nodules palpated. No crepitus. Lymphatic: No lymphadenopathy in the neck. Respiratory: No stridor or distress. Room air. Cardiovascular: Regular rate and rhythm. Extremities: No edema or cyanosis. Warm and well-perfused. Skin: No scars or lesions on face or neck. Neurologic: CN II-XII grossly intact. Moving all extremities without gross abnormality. Other:  Physical Exam HEENT: Atraumatic, normocephalic. Nose with nasal congestion and significant edema in the throat. Nasal mucosa normal. Nasal passages clear. Pharynx normal.  Seprately Identifiable Procedures:  I personally ordered, reviewed and interpreted the following with the patient today  Given the patient's symptoms and incomplete visualization of critical sinonasal areas with anterior rhinoscopy, a separately performed diagnostic nasal endoscopy procedure is indicated for a complete rhinologic evaluation per American Rhinologic Society recommendations (https://www.american-rhinologic.org/position-statements)  I personally ordered, reviewed and interpreted the following with the patient today  Procedure Note Diagnostic Nasal Endoscopy CPT CODE -- 68768 - Mod 25  Prior to initiating any procedures, risks/benefits/alternatives were explained to the patient and verbal consent obtained.  Pre-procedure diagnosis: Concern for sinusitis Post-procedure diagnosis: same Indication: See pre-procedure diagnosis and  physical exam above Complications: None apparent EBL: 0 mL Anesthesia: Lidocaine  4% and topical decongestant was topically sprayed in each nasal cavity  Description of Procedure:  Patient was identified. A flexible fiberoptic  endoscope was utilized to evaluate the sinonasal cavities, mucosa, sinus ostia and turbinates and septum.  Overall, signs of mucosal inflammation are noted.  Also noted are middle meatus edema and inferior turbinate hypertrophy.  No mucopurulence, polyps, or masses noted.   Right Middle meatus: edema Right SE Recess: clear Left MM: edema Left SE Recess: clear Photodocumentation was obtained.  Procedure Note Pre-procedure diagnosis:  sore throat Post-procedure diagnosis: Same Procedure: Transnasal Fiberoptic Laryngoscopy, CPT 31575 - Mod 25 Indication: sore throat Complications: None apparent EBL: 0 mL  The procedure was undertaken to further evaluate the patient's complaint of sore throat for over one month, with mirror exam inadequate for appropriate examination due to gag reflex and poor patient tolerance  Procedure:  Patient was identified as correct patient. Verbal consent was obtained. The nose was sprayed with oxymetazoline and 4% lidocaine . The The flexible laryngoscope was passed through the nose to view the nasal cavity, pharynx (oropharynx, hypopharynx) and larynx.  The larynx was examined at rest and during multiple phonatory tasks. Documentation was obtained and reviewed with patient. The scope was removed. The patient tolerated the procedure well.  Findings: The nasal cavity and nasopharynx did not reveal any masses or lesions, mucosa appeared to be without obvious lesions. The tongue base, pharyngeal walls, piriform sinuses, vallecula, epiglottis and postcricoid region are normal in appearance EXCEPT: interarytenoid edema and erythema. The visualized portion of the subglottis and proximal trachea is widely patent. The vocal folds are mobile bilaterally. There are no lesions on the free edge of the vocal folds nor elsewhere in the larynx worrisome for malignancy.    Electronically signed by: Penne Croak, DO 07/25/2024 4:32 PM   Impression & Plans:  Andrea Barry  is a 38 y.o. female  1. Burning tongue syndrome   2. Chronic pansinusitis   3. Hypertrophy of both inferior nasal turbinates   4. Nasal congestion    - Findings and diagnoses discussed in detail with the patient. - Risks, benefits, and alternatives were reviewed. Through shared decision making, the patient elects to proceed with below. Assessment & Plan Burning mouth syndrome Recurrent oral burning and mucosal rash with marked edema and swelling. Likely post-infectious irritation of lingual nerves. Partial symptomatic relief with mouth rinses and oral gel. Associated loss of taste. No neoplastic features. - Prescribed compounded capsaicin-containing oral rinse: four times daily for one week, then three times daily for one week, then two times daily for one week, then as needed. - Prescribed vitamin B12 supplementation. - Instructed to obtain the oral rinse at [facility]. - Advised follow-up in 6 weeks to 2 months to assess response to therapy. - no malignancy on scope  Chronic nasal congestion Persistent nasal congestion with copious mucus, facial pressure, and pain. Marked turbinate hypertrophy, mucosal edema, and congestion. Possible allergic component. No acute infection or structural abnormality. - Administered Afrin and lidocaine  nasal spray in clinic. - Prescribed nasal spray for symptomatic relief as needed for rhinorrhea. - Prescribed antibiotic for presumed bacterial sinusitis. - Provided education on sinus rinses and advised to obtain a sinus rinse kit from any pharmacy. - Advised follow-up in 6 weeks to 2 months to monitor progress.  - Orders placed: No orders of the defined types were placed in this encounter.  - Medications prescribed/continued/adjusted:  Meds  ordered this encounter  Medications   fluticasone  (FLONASE ) 50 MCG/ACT nasal spray    Sig: Place 2 sprays into both nostrils daily.    Dispense:  16 g    Refill:  6   amoxicillin -clavulanate (AUGMENTIN )  875-125 MG tablet    Sig: Take 1 tablet by mouth 2 (two) times daily.    Dispense:  20 tablet    Refill:  0   ipratropium (ATROVENT ) 0.03 % nasal spray    Sig: Place 2 sprays into both nostrils 3 (three) times daily as needed for rhinitis (runny nose).    Dispense:  30 mL    Refill:  12   cyanocobalamin (VITAMIN B12) 1000 MCG tablet    Sig: Take 1 tablet (1,000 mcg total) by mouth daily.    Dispense:  30 tablet    Refill:  0   - Education materials provided to the patient. - Follow up: 6 weeks. Patient instructed to return sooner or go to the ED if new/worsening symptoms develop.   Thank you for allowing me the opportunity to care for your patient. Please do not hesitate to contact me should you have any other questions.  Sincerely, Penne Croak, DO Otolaryngologist (ENT) [facility] Phone: [phone] Fax: [phone]  07/24/2024, 11:14 PM        [1]  Current Outpatient Medications:    amoxicillin -clavulanate (AUGMENTIN ) 875-125 MG tablet, Take 1 tablet by mouth 2 (two) times daily., Disp: 20 tablet, Rfl: 0   Collagen-Vitamin C-Biotin (COLLAGEN PO), Take by mouth., Disp: , Rfl:    cyanocobalamin (VITAMIN B12) 1000 MCG tablet, Take 1 tablet (1,000 mcg total) by mouth daily., Disp: 30 tablet, Rfl: 0   fluticasone  (FLONASE ) 50 MCG/ACT nasal spray, Place 2 sprays into both nostrils daily., Disp: 16 g, Rfl: 6   ipratropium (ATROVENT ) 0.03 % nasal spray, Place 2 sprays into both nostrils 3 (three) times daily as needed for rhinitis (runny nose)., Disp: 30 mL, Rfl: 12   omeprazole  (PRILOSEC) 40 MG capsule, Take 1 capsule (40 mg total) by mouth daily., Disp: 30 capsule, Rfl: 3   sertraline  (ZOLOFT ) 100 MG tablet, TAKE 1 TABLET(100 MG) BY MOUTH DAILY, Disp: 30 tablet, Rfl: 3   diclofenac  Sodium (VOLTAREN ) 1 % GEL, Apply 2 g topically 4 (four) times daily. As needed for back and lower abdominal pain (Patient not taking: Reported on 07/24/2024), Disp: 100 g, Rfl:  1   magic mouthwash (lidocaine , diphenhydrAMINE , alum & mag hydroxide) suspension, Swish and spit 5 mLs 3 (three) times daily as needed for mouth pain. (Patient not taking: Reported on 07/24/2024), Disp: 360 mL, Rfl: 0

## 2024-07-24 NOTE — Patient Instructions (Addendum)
 [facility]  Address: [address], [place], [place] [address] Phone: [phone]  Pick up oral rinse  BURNING MOUTH SYNDROME  1. Stress management helps more than any single other intervention. This could mean using a meditation app like Headspace, working a therapist who does Engineer, [employer], or finding a counselor through your primary care doctor or an online app like BetterHelp.  2. Capsaicin has been shown to help by burning out the pain receptors. This is a chemical that comes from the red (hot) pepper. It works by depleting substance P, the chemical that allows nerves to communicate pain/itch. In your condition, the nerves are overactive in the skin. Depleting their ability to signal will result in loss of ability to signal baseline itch/pain. Apply Capsaicin at prescribed doseage (0.025% for oral mucosa).  Please wear gloves or thoroughly wash hands after application.  If you do get this on another body area that is undesired, using olive or vegetable oil is best to dissolve this chemical but does not work instantaneously. It will burn at first, but that will get better with continued use.  You do need to stick to this treatment for it to be effective. Treat in the following manner. Capsaicin 4x daily to affected area x 1 week Capsaicin 3x daily to affected area x 1 week Capsaicin 2x daily to affected area x 2 weeks Capsaicin 1x daily to affected area until follow-up/as needed.  3. Vitamin B12 1000mcg daily  4. Make sure your doctor has sent bloodwork to check for any issues with vitamin levels  5. Try taking alpha lipoic acid dose of 600-800 mg/day   Anecdotal treatments from patients: 1. Vitamin E oil (open the capsule and apply it directly to tongue). 2.  Lysine supplementation  Patient Information Sheet: Burning Mouth Syndrome (BMS)**  What is Burning Mouth Syndrome (BMS)? Burning Mouth Syndrome (BMS) is a condition characterized by a  persistent, often painful, burning sensation in the mouth without an obvious cause. It can affect the tongue, lips, gums, palate, or the entire mouth.  Symptoms of BMS: - Burning Sensation: Typically feels like scalding or tingling in the mouth, often worsening as the day progresses. - Dry Mouth: You may feel like your mouth is dry, even though saliva production is normal. - Altered Taste: Some patients experience a bitter or metallic taste in the mouth. - Numbness or Tingling: Occasionally, patients report sensations of numbness or tingling.  Who Gets BMS? BMS most commonly affects: - Women, especially postmenopausal women. - Adults over 54 years old.  Possible Causes: The exact cause of BMS is often unknown, but some contributing factors include: - Nerve Damage: Problems with nerves that control taste and pain. - Hormonal Changes: Especially in women going through menopause. - Nutritional Deficiencies: Low levels of iron, zinc, or vitamin B12. - Dry Mouth: From medications or conditions like Sjgrens syndrome. - Allergies or Reactions: To foods, dental materials, or oral care products. - Stress and Anxiety: Psychological factors may exacerbate symptoms.  #### **Diagnosis:** BMS is a diagnosis of exclusion, meaning it is diagnosed after other causes are ruled out. Your doctor or dentist may perform: - A physical examination. - Blood tests to check for nutritional deficiencies. - Allergy tests or saliva flow tests. - Oral swabs to rule out infections.  #### **Treatment Options:** While there is no definitive cure for BMS, the following treatments may help alleviate symptoms: 1. **Medications**:    - Pain relievers, such as over-the-counter analgesics or  prescription medications like tricyclic antidepressants, benzodiazepines, or anticonvulsants.    - Oral rinses with anesthetic properties. 2. **Saliva Substitutes**: To relieve dry mouth symptoms. 3. **Dietary Changes**: Adjusting  your diet to avoid spicy, acidic, or irritating foods. 4. **Vitamin or Mineral Supplements**: If deficiencies are identified. 5. **Cognitive Behavioral Therapy (CBT)**: For managing anxiety or stress that may worsen symptoms. 6. **Avoid Irritants**: Avoid alcohol, tobacco, and irritating dental products.  #### **Self-care Tips:** - **Stay Hydrated**: Drink plenty of water  to keep your mouth moist. - **Avoid Triggers**: Spicy, acidic foods, and alcohol may exacerbate symptoms. - **Practice Good Oral Hygiene**: Use gentle, non-irritating oral care products. - **Stress Management**: Engage in relaxation techniques like meditation or yoga.

## 2024-07-24 NOTE — Patient Instructions (Signed)
 Andrea Barry [place] - I am sorry I was unable to reach you today for our scheduled appointment. I work with Tanda Bleacher, MD and am calling to support your healthcare needs. Please contact me at [phone] at your earliest convenience. I look forward to speaking with you soon.   Thank you,   [name], BSW [place]/VBCI - [facility] Social Worker [phone]

## 2024-07-31 ENCOUNTER — Other Ambulatory Visit: Payer: Self-pay

## 2024-07-31 ENCOUNTER — Other Ambulatory Visit: Payer: Self-pay | Admitting: *Deleted

## 2024-07-31 NOTE — Patient Instructions (Signed)
 Visit Information  Andrea Barry was given information about Medicaid Managed Care team care coordination services as a part of their [facility] Medicaid benefit.   If you would like to schedule transportation through your [facility], please call the following number at least 2 days in advance of your appointment: [phone]   Rides for urgent appointments can also be made after hours by calling Member Services.  Call the Behavioral Health Crisis Line at [phone], at any time, 24 hours a day, 7 days a week. If you are in danger or need immediate medical attention call 911.  Please see education materials related to GERD provided by MyChart link.  Care plan and visit instructions communicated with the patient verbally today. Patient agrees to receive a copy in MyChart. Active MyChart status and patient understanding of how to access instructions and care plan via MyChart confirmed with patient.     Telephone follow up appointment with Managed Medicaid care management team member scheduled for: 09/13/24 @ 1130am  Marlene Pfluger, RN, BSN, ACM RN Care Manager [facility] [phone]   Following is a copy of your plan of care:   Goals Addressed             This Visit's Progress    VBCI RN Care Plan       Problems:  Care Coordination needs related to Food Insecurity , Transportation, and Utilities Chronic Disease Management support and education needs related to GERD  Goal: Over the next 90 days the Patient will attend all scheduled medical appointments: PCP and Specialist as evidenced by keeping all scheduled appointments.        continue to work with Medical Illustrator and/or Social Worker to address care management and care coordination needs related to GERD as evidenced by adherence to care management team scheduled appointments     take all medications exactly as prescribed and will call provider for medication related  questions as evidenced by compliance.    verbalize basic understanding of GERD disease process and self health management plan as evidenced by verbal explanation, recognize, monitor symptoms and life style changes.   work with child psychotherapist to address Lack of essential utilities:  *, Limited access to food, and Transportation related to the management of GERD as evidenced by review of electronic medical record and patient or social worker report      Interventions:    Evaluation of current treatment plan related to GERD and patient's adherence to plan as established by provider. Provided education to patient and/or caregiver about advanced directives Discussed plans with patient for ongoing care management follow up and provided patient with direct contact information for care management team Social Work referral for Food insecurity, Transportation and [employer].   Patient Self-Care Activities:  Attend all scheduled provider appointments Attend church or other social activities Call pharmacy for medication refills 3-7 days in advance of running out of medications Call provider office for new concerns or questions  Perform all self care activities independently  Take medications as prescribed   Work with the social worker to address care coordination needs and will continue to work with the clinical team to address health care and disease management related needs Don't lie down right after eating. Don't eat anything three hours before bedtime. Sleep on left side if you are able.   Avoid foods like ( Spicy or fatty foods),Coffee tea and carbonated drinks, fried foods, chocolates, and citrus fruits and tomatoes.  Plan:  Telephone follow up appointment with care management team member scheduled for:  09/13/24 @ 11:30 am

## 2024-07-31 NOTE — Patient Outreach (Addendum)
 Complex Care Management   Visit Note  07/31/2024  Name:  Andrea Barry MRN: [id number] DOB: 1986/04/09  Situation: Referral received for Complex Care Management related to SDOH Barriers:  Transportation Food insecurity Lack of essential utilities   and GERD I obtained verbal consent from Patient.  Visit completed with Patient  on the phone  Background:   Past Medical History:  Diagnosis Date   Anemia    Chronic pelvic pain in female    Depression    Dysuria    Frequency of urination    Headache    History of cervical dysplasia    History of cervical incompetence    w/ hx fetal demise in 2017 gestation 22wks   History of hypothyroidism    Last TSH (04/2017) was normal, off levothyroxine for months   History of kidney stones    Mass of urethra    w/ vaginal mass   MDD (major depressive disorder)    Mixed urge and stress incontinence    Urgency of urination     Assessment:  Outreach completed.  [employer] used for call today.  [id number].   Patient Reported Symptoms:  Cognitive Cognitive Status: Able to follow simple commands, Alert and oriented to person, place, and time, Insightful and able to interpret abstract concepts, Normal speech and language skills Cognitive/Intellectual Conditions Management [RPT]: None reported or documented in medical history or problem list   Health Maintenance Behaviors: Annual physical exam, Healthy diet, Spiritual practice(s), Sleep adequate, Stress management Healing Pattern: Fast Health Facilitated by: Healthy diet, Prayer/meditation, Rest, Stress management  Neurological Neurological Review of Symptoms: Headaches Neurological Management Strategies: Adequate rest, Coping strategies, Routine screening, Medication therapy Neurological Self-Management Outcome: 3 (uncertain) Neurological Comment: Occassional dizziness.  Patient reports having frequent headaches.  She reports that she takes Tylenol  to assist with headaches.   HEENT HEENT Symptoms Reported: Sore throat HEENT Management Strategies: Adequate rest, Coping strategies, Medication therapy, Routine screening HEENT Self-Management Outcome: 3 (uncertain) HEENT Comment: Patient reports that she has issues with chronic sinus. She reports that she had recent throat infection in her throat.  She informs me that she is followed by ENT and is currently on antibiotics for her throat.    Cardiovascular Does patient have uncontrolled Hypertension?: No Cardiovascular Management Strategies: Adequate rest, Routine screening Cardiovascular Self-Management Outcome: 4 (good)  Respiratory Respiratory Symptoms Reported: No symptoms reported, Dry cough Other Respiratory Symptoms: Report dry cough due to drainage in her throat and throat irritation Respiratory Management Strategies: Adequate rest, Coping strategies, Routine screening Respiratory Self-Management Outcome: 3 (uncertain)  Endocrine Endocrine Symptoms Reported: No symptoms reported Is patient diabetic?: No Endocrine Self-Management Outcome: 4 (good)  Gastrointestinal Gastrointestinal Symptoms Reported: No symptoms reported Gastrointestinal Management Strategies: Adequate rest Gastrointestinal Self-Management Outcome: 4 (good)    Genitourinary Genitourinary Symptoms Reported: Pain/burning with urination, Incontinence Additional Genitourinary Details: Patient reports she has incontinence and she uses incontinence garments/pad daily.  Reports that when she does not drink enough water  she reports that she has some buring when she voids. Genitourinary Management Strategies: Adequate rest, Incontinence garment/pad Genitourinary Self-Management Outcome: 3 (uncertain)  Integumentary Integumentary Symptoms Reported: No symptoms reported Skin Management Strategies: Adequate rest, Routine screening Skin Self-Management Outcome: 3 (uncertain)  Musculoskeletal Musculoskelatal Symptoms Reviewed: Back pain Additional  Musculoskeletal Details: Occassional neck and back pain due to bad sleeping positions.  Reports no pain today. Musculoskeletal Management Strategies: Adequate rest, Routine screening, Coping strategies Musculoskeletal Self-Management Outcome: 3 (uncertain) Falls in the past year?: No Number of  falls in past year: 1 or less Was there an injury with Fall?: No Fall Risk Category Calculator: 0 Patient Fall Risk Level: Low Fall Risk Patient at Risk for Falls Due to: No Fall Risks  Psychosocial Psychosocial Symptoms Reported: No symptoms reported Additional Psychological Details: Reports that she takes Zoloft  and reports that medication is managing her psychosocial symptoms. Behavioral Health Self-Management Outcome: 4 (good) Major Change/Loss/Stressor/Fears (CP): Denies Techniques to Cope with Loss/Stress/Change: Medication Quality of Family Relationships: helpful, involved, supportive Do you feel physically threatened by others?: No    07/31/2024    PHQ2-9 Depression Screening   Little interest or pleasure in doing things Not at all  Feeling down, depressed, or hopeless Not at all  PHQ-2 - Total Score 0  Trouble falling or staying asleep, or sleeping too much    Feeling tired or having little energy    Poor appetite or overeating     Feeling bad about yourself - or that you are a failure or have let yourself or your family down    Trouble concentrating on things, such as reading the newspaper or watching television    Moving or speaking so slowly that other people could have noticed.  Or the opposite - being so fidgety or restless that you have been moving around a lot more than usual    Thoughts that you would be better off dead, or hurting yourself in some way    PHQ2-9 Total Score    If you checked off any problems, how difficult have these problems made it for you to do your work, take care of things at home, or get along with other people    Depression Interventions/Treatment       There were no vitals filed for this visit. Pain Scale: 0-10 Pain Score: 0-No pain  Medications Reviewed Today   Medications were not reviewed in this encounter     Recommendation:   PCP Follow-up Specialty provider follow-up : Otolaryngology-09/07/24 Continue Current Plan of Care  Follow Up Plan:   Telephone follow-up in 1 month: 09/13/24 @ 1130 am.   Bani Gianfrancesco, RN, BSN, ACM RN Care Manager [employer] [phone]

## 2024-08-07 ENCOUNTER — Other Ambulatory Visit: Payer: Self-pay

## 2024-08-07 NOTE — Patient Outreach (Signed)
 Social Drivers of Health  Community Resource and Care Coordination Visit Note   08/07/2024  Name: Andrea Barry MRN: [id number] DOB:03-31-1986  Situation: Referral received for [facility] needs assessment and assistance related to [employer] . I obtained verbal consent from Patient.  Visit completed with Patient on the phone.   Background:   SDOH Interventions Today    Flowsheet Row Most Recent Value  SDOH Interventions   Food Insecurity Interventions --  [patient declined food resources. Pt states she is ok with food right now.]  Housing Interventions Intervention Not Indicated  Transportation Interventions Patient Resources (Friends/Family), Payor Benefit  [BSW will provide patient resources on adult driving courses/training]  Utilities Interventions Intervention Not Indicated  Financial Strain Interventions Intervention Not Indicated     Assessment:   Goals Addressed             This Visit's Progress    BSW Goal       Current SDOH Barriers:  Utility assistance resources Transportation  Interventions: Patient interviewed and appropriate screenings performed Referred patient to community resources  Provided patient with information about transportation and utility assistance resources. Patient declined food resources at this time but agreed to reach out if needed.  Discussed plans with patient for ongoing follow up and provided patient with direct contact number Advised patient to f/u with medicaid transportation for medical appointment transportation.   08/07/2024 Patient received utility and transportation resources via email.  Pt declined food resources at this time.        Recommendation:   attend all scheduled provider appointments call for transportation assistance at least one week before appointments ask for help if you don't understand your health insurance benefits  Follow Up Plan:   Telephone follow up appointment date/time:   08/21/2024 at 10AM.   [name], BSW [place]/VBCI - [facility] Social Worker [phone]

## 2024-08-07 NOTE — Patient Instructions (Signed)
 Visit Information  Ms. Andrea Barry was given information about Medicaid Managed Care team care coordination services as a part of their [facility] Community Plan Medicaid benefit.   If you would like to schedule transportation through your [facility], please call the following number at least 2 days in advance of your appointment: [phone]   Rides for urgent appointments can also be made after hours by calling Member Services.  Call the Behavioral Health Crisis Line at [phone], at any time, 24 hours a day, 7 days a week. If you are in danger or need immediate medical attention call 911.   Care plan and visit instructions communicated with the patient verbally today. Patient agrees to receive a copy in MyChart. Active MyChart status and patient understanding of how to access instructions and care plan via MyChart confirmed with patient.     Telephone follow up appointment with Managed Medicaid care management team member scheduled for: 08/21/2024 at 10Am  [name], [place]/VBCI - [facility] Social Worker [phone]   Following is a copy of your plan of care:   Goals Addressed             This Visit's Progress    BSW Goal       Current SDOH Barriers:  Utility assistance resources Transportation  Interventions: Patient interviewed and appropriate screenings performed Referred patient to community resources  Provided patient with information about transportation and utility assistance resources. Patient declined food resources at this time but agreed to reach out if needed.  Discussed plans with patient for ongoing follow up and provided patient with direct contact number Advised patient to f/u with medicaid transportation for medical appointment transportation.   08/07/2024 Patient received utility and transportation resources via email.  Pt declined food resources at this time.

## 2024-08-20 ENCOUNTER — Encounter: Payer: Self-pay | Admitting: *Deleted

## 2024-08-21 ENCOUNTER — Other Ambulatory Visit: Payer: Self-pay

## 2024-08-21 NOTE — Patient Outreach (Signed)
 Social Drivers of Health  Community Resource and Care Coordination Visit Note   08/21/2024  Name: Andrea Barry MRN: [id number] DOB:1986/04/14  Situation: Referral received for [facility] needs assessment and assistance related to [employer] . I obtained verbal consent from Patient.  Visit completed with Patient on the phone.   Background:   SDOH Interventions Today    Flowsheet Row Most Recent Value  SDOH Interventions   Food Insecurity Interventions Other (Comment)  [pt states she is ok with food right now.]  Housing Interventions Intervention Not Indicated  Transportation Interventions Patient Resources (Friends/Family), Payor Benefit  [adult driving programs provided via email.]  Utilities Interventions Intervention Not Indicated  Financial Strain Interventions Intervention Not Indicated     Assessment:   Goals Addressed             This Visit's Progress    COMPLETED: BSW Goal       Current SDOH Barriers:  Utility assistance resources Transportation  Interventions: Patient interviewed and appropriate screenings performed Referred patient to community resources  Provided patient with information about transportation and utility assistance resources. Patient declined food resources at this time but agreed to reach out if needed.  Discussed plans with patient for ongoing follow up and provided patient with direct contact number Advised patient to f/u with medicaid transportation for medical appointment transportation.   08/07/2024 Patient received utility and transportation resources via email.  Pt declined food resources at this time.        Recommendation:   attend all scheduled provider appointments call for transportation assistance at least one week before appointments ask for help if you don't understand your health insurance benefits  Follow Up Plan:   Patient declines further calls or assistance. [employer] will be closed. Patient has been provided contact information should new needs arise.   [name], BSW [place]/VBCI - [employer] Social Worker [phone]

## 2024-08-21 NOTE — Patient Instructions (Signed)
 Visit Information  Ms. Andrea Barry was given information about Medicaid Managed Care team care coordination services as a part of their [facility] Community Plan Medicaid benefit.   If you would like to schedule transportation through your [facility], please call the following number at least 2 days in advance of your appointment: [phone]   Rides for urgent appointments can also be made after hours by calling Member Services.  Call the Behavioral Health Crisis Line at [phone], at any time, 24 hours a day, 7 days a week. If you are in danger or need immediate medical attention call 911.  Care plan and visit instructions communicated with the patient verbally today. Patient agrees to receive a copy in MyChart. Active MyChart status and patient understanding of how to access instructions and care plan via MyChart confirmed with patient.     No further follow up required: Patient states she is ok for now with a f/u. Pt was given instructions on how to seek BSW services again in the future.   [name], BSW [place]/VBCI - [employer] Social Worker [phone]   Following is a copy of your plan of care:   Goals Addressed             This Visit's Progress    COMPLETED: BSW Goal       Current SDOH Barriers:  Utility assistance resources Transportation  Interventions: Patient interviewed and appropriate screenings performed Referred patient to community resources  Provided patient with information about transportation and utility assistance resources. Patient declined food resources at this time but agreed to reach out if needed.  Discussed plans with patient for ongoing follow up and provided patient with direct contact number Advised patient to f/u with medicaid transportation for medical appointment transportation.   08/07/2024 Patient received utility and transportation resources via email.  Pt declined food resources at  this time.

## 2024-08-29 ENCOUNTER — Other Ambulatory Visit: Payer: Self-pay | Admitting: Family Medicine

## 2024-08-29 DIAGNOSIS — Z1331 Encounter for screening for depression: Secondary | ICD-10-CM

## 2024-08-29 NOTE — Telephone Encounter (Signed)
 Requested medication (s) are due for refill today: yes  Requested medication (s) are on the active medication list: yes  Last refill:  09/29/23  Future visit scheduled: {Yes  Notes to clinic:  Unable to refill per protocol, last refill by another provider.      Requested Prescriptions  Pending Prescriptions Disp Refills   sertraline  (ZOLOFT ) 100 MG tablet 30 tablet 3     Psychiatry:  Antidepressants - SSRI - sertraline  Failed - 08/29/2024  4:02 PM      Failed - AST in normal range and within 360 days    AST  Date Value Ref Range Status  07/07/2020 19 0 - 40 IU/L Final         Failed - ALT in normal range and within 360 days    ALT  Date Value Ref Range Status  07/07/2020 10 0 - 32 IU/L Final         Passed - Completed PHQ-2 or PHQ-9 in the last 360 days      Passed - Valid encounter within last 6 months    Recent Outpatient Visits           2 months ago Gastroesophageal reflux disease, unspecified whether esophagitis present   [facility], MD   4 years ago Vaginal discharge   [facility], Mikel HERO, MD   5 years ago Visit for wound check   [facility], Mikel HERO, MD   5 years ago Nexplanon  removal   [facility], Mikel HERO, MD   7 years ago Positive urine pregnancy test   [facility], Mikel HERO, MD

## 2024-08-29 NOTE — Telephone Encounter (Signed)
 Copied from CRM #[id number]. Topic: Clinical - Medication Refill >> Aug 29, 2024 12:24 PM Zebedee SAUNDERS wrote: Medication:  sertraline  (ZOLOFT ) 100 MG tablet   Has the patient contacted their pharmacy? Yes (Agent: If no, request that the patient contact the pharmacy for the refill. If patient does not wish to contact the pharmacy document the reason why and proceed with request.) (Agent: If yes, when and what did the pharmacy advise?)Pharmacy need script  This is the patient's preferred pharmacy:  [facility] [address], [place] - [address] [place] [address] Phone: [phone] Fax: [phone]  Is this the correct pharmacy for this prescription? Yes If no, delete pharmacy and type the correct one.   Has the prescription been filled recently? No  Is the patient out of the medication? Yes  Has the patient been seen for an appointment in the last year OR does the patient have an upcoming appointment? Yes  Can we respond through MyChart? Yes  Agent: Please be advised that Rx refills may take up to 3 business days. We ask that you follow-up with your pharmacy.

## 2024-09-07 ENCOUNTER — Ambulatory Visit (INDEPENDENT_AMBULATORY_CARE_PROVIDER_SITE_OTHER)

## 2024-09-13 ENCOUNTER — Telehealth: Payer: Self-pay | Admitting: *Deleted

## 2024-09-14 ENCOUNTER — Other Ambulatory Visit: Payer: Self-pay | Admitting: Obstetrics and Gynecology

## 2024-09-14 DIAGNOSIS — Z1331 Encounter for screening for depression: Secondary | ICD-10-CM

## 2024-10-11 ENCOUNTER — Telehealth: Payer: Self-pay | Admitting: *Deleted

## 2024-10-11 ENCOUNTER — Telehealth: Admitting: *Deleted
